# Patient Record
Sex: Female | Born: 1970 | Race: Black or African American | Hispanic: No | Smoking: Never smoker
Health system: Southern US, Community
[De-identification: ages and names within clinical notes are randomized; demographics above are authoritative.]

## PROBLEM LIST (undated history)

## (undated) ENCOUNTER — Inpatient Hospital Stay (HOSPITAL_COMMUNITY): Payer: Self-pay

## (undated) DIAGNOSIS — F4310 Post-traumatic stress disorder, unspecified: Secondary | ICD-10-CM

## (undated) DIAGNOSIS — F1410 Cocaine abuse, uncomplicated: Secondary | ICD-10-CM

## (undated) DIAGNOSIS — J45909 Unspecified asthma, uncomplicated: Secondary | ICD-10-CM

## (undated) DIAGNOSIS — F419 Anxiety disorder, unspecified: Secondary | ICD-10-CM

## (undated) DIAGNOSIS — F99 Mental disorder, not otherwise specified: Secondary | ICD-10-CM

## (undated) DIAGNOSIS — S86019A Strain of unspecified Achilles tendon, initial encounter: Secondary | ICD-10-CM

## (undated) DIAGNOSIS — Z889 Allergy status to unspecified drugs, medicaments and biological substances status: Secondary | ICD-10-CM

## (undated) HISTORY — PX: DILATION AND EVACUATION: SHX1459

## (undated) HISTORY — DX: Mental disorder, not otherwise specified: F99

## (undated) HISTORY — DX: Anxiety disorder, unspecified: F41.9

## (undated) HISTORY — PX: WISDOM TOOTH EXTRACTION: SHX21

## (undated) HISTORY — PX: NO PAST SURGERIES: SHX2092

## (undated) HISTORY — DX: Post-traumatic stress disorder, unspecified: F43.10

---

## 1998-05-20 ENCOUNTER — Other Ambulatory Visit: Admission: RE | Admit: 1998-05-20 | Discharge: 1998-05-20 | Payer: Self-pay | Admitting: Obstetrics & Gynecology

## 1998-05-20 ENCOUNTER — Other Ambulatory Visit: Admission: RE | Admit: 1998-05-20 | Discharge: 1998-05-20 | Payer: Self-pay | Admitting: Obstetrics and Gynecology

## 1998-06-09 ENCOUNTER — Inpatient Hospital Stay (HOSPITAL_COMMUNITY): Admission: AD | Admit: 1998-06-09 | Discharge: 1998-06-09 | Payer: Self-pay | Admitting: Obstetrics

## 1998-06-10 ENCOUNTER — Emergency Department (HOSPITAL_COMMUNITY): Admission: EM | Admit: 1998-06-10 | Discharge: 1998-06-10 | Payer: Self-pay | Admitting: Emergency Medicine

## 1998-08-09 ENCOUNTER — Inpatient Hospital Stay (HOSPITAL_COMMUNITY): Admission: AD | Admit: 1998-08-09 | Discharge: 1998-08-09 | Payer: Self-pay | Admitting: Obstetrics and Gynecology

## 1998-08-09 ENCOUNTER — Encounter: Payer: Self-pay | Admitting: Obstetrics and Gynecology

## 1998-11-12 ENCOUNTER — Inpatient Hospital Stay (HOSPITAL_COMMUNITY): Admission: AD | Admit: 1998-11-12 | Discharge: 1998-11-12 | Payer: Self-pay | Admitting: Obstetrics and Gynecology

## 1998-12-12 ENCOUNTER — Inpatient Hospital Stay (HOSPITAL_COMMUNITY): Admission: AD | Admit: 1998-12-12 | Discharge: 1998-12-15 | Payer: Self-pay | Admitting: Obstetrics & Gynecology

## 1998-12-15 ENCOUNTER — Encounter (HOSPITAL_COMMUNITY): Admission: RE | Admit: 1998-12-15 | Discharge: 1999-03-15 | Payer: Self-pay | Admitting: Obstetrics & Gynecology

## 2000-08-13 ENCOUNTER — Inpatient Hospital Stay (HOSPITAL_COMMUNITY): Admission: AD | Admit: 2000-08-13 | Discharge: 2000-08-13 | Payer: Self-pay | Admitting: *Deleted

## 2000-08-13 ENCOUNTER — Encounter: Payer: Self-pay | Admitting: *Deleted

## 2000-09-12 ENCOUNTER — Encounter: Payer: Self-pay | Admitting: Obstetrics

## 2000-09-12 ENCOUNTER — Inpatient Hospital Stay (HOSPITAL_COMMUNITY): Admission: AD | Admit: 2000-09-12 | Discharge: 2000-09-12 | Payer: Self-pay | Admitting: Obstetrics

## 2000-10-10 ENCOUNTER — Other Ambulatory Visit: Admission: RE | Admit: 2000-10-10 | Discharge: 2000-10-10 | Payer: Self-pay | Admitting: Obstetrics and Gynecology

## 2001-01-22 ENCOUNTER — Observation Stay (HOSPITAL_COMMUNITY): Admission: AD | Admit: 2001-01-22 | Discharge: 2001-01-23 | Payer: Self-pay | Admitting: Obstetrics & Gynecology

## 2001-01-23 ENCOUNTER — Encounter: Payer: Self-pay | Admitting: Obstetrics & Gynecology

## 2001-03-09 ENCOUNTER — Inpatient Hospital Stay (HOSPITAL_COMMUNITY): Admission: AD | Admit: 2001-03-09 | Discharge: 2001-03-11 | Payer: Self-pay | Admitting: Obstetrics and Gynecology

## 2001-11-23 ENCOUNTER — Inpatient Hospital Stay (HOSPITAL_COMMUNITY): Admission: AD | Admit: 2001-11-23 | Discharge: 2001-11-23 | Payer: Self-pay | Admitting: Obstetrics

## 2002-04-20 ENCOUNTER — Other Ambulatory Visit: Admission: RE | Admit: 2002-04-20 | Discharge: 2002-04-20 | Payer: Self-pay | Admitting: Obstetrics and Gynecology

## 2002-07-12 ENCOUNTER — Encounter (INDEPENDENT_AMBULATORY_CARE_PROVIDER_SITE_OTHER): Payer: Self-pay

## 2002-07-12 ENCOUNTER — Inpatient Hospital Stay (HOSPITAL_COMMUNITY): Admission: AD | Admit: 2002-07-12 | Discharge: 2002-07-13 | Payer: Self-pay | Admitting: Obstetrics and Gynecology

## 2003-03-05 ENCOUNTER — Inpatient Hospital Stay (HOSPITAL_COMMUNITY): Admission: AD | Admit: 2003-03-05 | Discharge: 2003-03-05 | Payer: Self-pay | Admitting: Obstetrics and Gynecology

## 2003-12-16 ENCOUNTER — Emergency Department (HOSPITAL_COMMUNITY): Admission: EM | Admit: 2003-12-16 | Discharge: 2003-12-16 | Payer: Self-pay | Admitting: Emergency Medicine

## 2005-04-11 ENCOUNTER — Emergency Department (HOSPITAL_COMMUNITY): Admission: EM | Admit: 2005-04-11 | Discharge: 2005-04-11 | Payer: Self-pay | Admitting: Emergency Medicine

## 2005-07-23 ENCOUNTER — Emergency Department (HOSPITAL_COMMUNITY): Admission: EM | Admit: 2005-07-23 | Discharge: 2005-07-23 | Payer: Self-pay | Admitting: Emergency Medicine

## 2005-09-22 ENCOUNTER — Emergency Department (HOSPITAL_COMMUNITY): Admission: EM | Admit: 2005-09-22 | Discharge: 2005-09-22 | Payer: Self-pay | Admitting: Emergency Medicine

## 2005-11-14 ENCOUNTER — Inpatient Hospital Stay (HOSPITAL_COMMUNITY): Admission: AD | Admit: 2005-11-14 | Discharge: 2005-11-14 | Payer: Self-pay | Admitting: Family Medicine

## 2006-05-22 ENCOUNTER — Emergency Department (HOSPITAL_COMMUNITY): Admission: EM | Admit: 2006-05-22 | Discharge: 2006-05-22 | Payer: Self-pay

## 2006-05-24 ENCOUNTER — Emergency Department (HOSPITAL_COMMUNITY): Admission: EM | Admit: 2006-05-24 | Discharge: 2006-05-24 | Payer: Self-pay | Admitting: Family Medicine

## 2006-07-05 ENCOUNTER — Emergency Department (HOSPITAL_COMMUNITY): Admission: EM | Admit: 2006-07-05 | Discharge: 2006-07-05 | Payer: Self-pay | Admitting: Family Medicine

## 2006-09-16 ENCOUNTER — Emergency Department (HOSPITAL_COMMUNITY): Admission: EM | Admit: 2006-09-16 | Discharge: 2006-09-16 | Payer: Self-pay | Admitting: Emergency Medicine

## 2006-09-19 ENCOUNTER — Ambulatory Visit (HOSPITAL_COMMUNITY): Admission: RE | Admit: 2006-09-19 | Discharge: 2006-09-19 | Payer: Self-pay | Admitting: Oral and Maxillofacial Surgery

## 2006-12-04 ENCOUNTER — Emergency Department (HOSPITAL_COMMUNITY): Admission: EM | Admit: 2006-12-04 | Discharge: 2006-12-04 | Payer: Self-pay | Admitting: Emergency Medicine

## 2006-12-06 ENCOUNTER — Inpatient Hospital Stay (HOSPITAL_COMMUNITY): Admission: AD | Admit: 2006-12-06 | Discharge: 2006-12-06 | Payer: Self-pay | Admitting: Obstetrics & Gynecology

## 2006-12-07 ENCOUNTER — Emergency Department (HOSPITAL_COMMUNITY): Admission: EM | Admit: 2006-12-07 | Discharge: 2006-12-08 | Payer: Self-pay | Admitting: Emergency Medicine

## 2007-01-18 ENCOUNTER — Emergency Department (HOSPITAL_COMMUNITY): Admission: EM | Admit: 2007-01-18 | Discharge: 2007-01-18 | Payer: Self-pay | Admitting: Emergency Medicine

## 2007-09-08 ENCOUNTER — Emergency Department (HOSPITAL_COMMUNITY): Admission: EM | Admit: 2007-09-08 | Discharge: 2007-09-08 | Payer: Self-pay | Admitting: Emergency Medicine

## 2008-01-30 ENCOUNTER — Emergency Department (HOSPITAL_COMMUNITY): Admission: EM | Admit: 2008-01-30 | Discharge: 2008-01-30 | Payer: Self-pay | Admitting: Emergency Medicine

## 2008-02-01 ENCOUNTER — Emergency Department (HOSPITAL_COMMUNITY): Admission: EM | Admit: 2008-02-01 | Discharge: 2008-02-01 | Payer: Self-pay | Admitting: Family Medicine

## 2008-02-25 ENCOUNTER — Inpatient Hospital Stay (HOSPITAL_COMMUNITY): Admission: AD | Admit: 2008-02-25 | Discharge: 2008-02-25 | Payer: Self-pay | Admitting: Obstetrics and Gynecology

## 2008-11-30 ENCOUNTER — Emergency Department (HOSPITAL_COMMUNITY): Admission: EM | Admit: 2008-11-30 | Discharge: 2008-11-30 | Payer: Self-pay | Admitting: Emergency Medicine

## 2008-12-01 ENCOUNTER — Emergency Department (HOSPITAL_COMMUNITY): Admission: EM | Admit: 2008-12-01 | Discharge: 2008-12-01 | Payer: Self-pay | Admitting: Emergency Medicine

## 2008-12-02 ENCOUNTER — Emergency Department (HOSPITAL_COMMUNITY): Admission: EM | Admit: 2008-12-02 | Discharge: 2008-12-02 | Payer: Self-pay | Admitting: Emergency Medicine

## 2008-12-04 ENCOUNTER — Emergency Department (HOSPITAL_COMMUNITY): Admission: EM | Admit: 2008-12-04 | Discharge: 2008-12-04 | Payer: Self-pay | Admitting: Emergency Medicine

## 2008-12-09 ENCOUNTER — Emergency Department (HOSPITAL_COMMUNITY): Admission: EM | Admit: 2008-12-09 | Discharge: 2008-12-09 | Payer: Self-pay | Admitting: Emergency Medicine

## 2008-12-12 ENCOUNTER — Emergency Department (HOSPITAL_COMMUNITY): Admission: EM | Admit: 2008-12-12 | Discharge: 2008-12-12 | Payer: Self-pay | Admitting: Emergency Medicine

## 2008-12-12 ENCOUNTER — Inpatient Hospital Stay (HOSPITAL_COMMUNITY): Admission: EM | Admit: 2008-12-12 | Discharge: 2008-12-13 | Payer: Self-pay | Admitting: Emergency Medicine

## 2008-12-12 ENCOUNTER — Ambulatory Visit: Payer: Self-pay | Admitting: Internal Medicine

## 2008-12-17 ENCOUNTER — Encounter: Payer: Self-pay | Admitting: Internal Medicine

## 2008-12-17 ENCOUNTER — Emergency Department (HOSPITAL_COMMUNITY): Admission: EM | Admit: 2008-12-17 | Discharge: 2008-12-17 | Payer: Self-pay | Admitting: Emergency Medicine

## 2008-12-25 ENCOUNTER — Emergency Department (HOSPITAL_COMMUNITY): Admission: EM | Admit: 2008-12-25 | Discharge: 2008-12-25 | Payer: Self-pay | Admitting: Emergency Medicine

## 2009-01-09 ENCOUNTER — Emergency Department (HOSPITAL_COMMUNITY): Admission: EM | Admit: 2009-01-09 | Discharge: 2009-01-09 | Payer: Self-pay | Admitting: Emergency Medicine

## 2009-01-10 ENCOUNTER — Emergency Department (HOSPITAL_COMMUNITY): Admission: EM | Admit: 2009-01-10 | Discharge: 2009-01-10 | Payer: Self-pay | Admitting: Emergency Medicine

## 2009-01-18 ENCOUNTER — Emergency Department (HOSPITAL_COMMUNITY): Admission: EM | Admit: 2009-01-18 | Discharge: 2009-01-18 | Payer: Self-pay | Admitting: *Deleted

## 2009-01-29 ENCOUNTER — Emergency Department (HOSPITAL_COMMUNITY): Admission: EM | Admit: 2009-01-29 | Discharge: 2009-01-29 | Payer: Self-pay | Admitting: Emergency Medicine

## 2010-02-26 IMAGING — CR DG ABDOMEN ACUTE W/ 1V CHEST
3 series · 3 of 3 positions shown · non-contrast
Comparison: 01/30/2008

CLINICAL DATA: Nausea, vomiting.  Abdominal pain.

ACUTE ABDOMEN SERIES (ABDOMEN 2 VIEW & CHEST 1 VIEW)

[w chest pa]
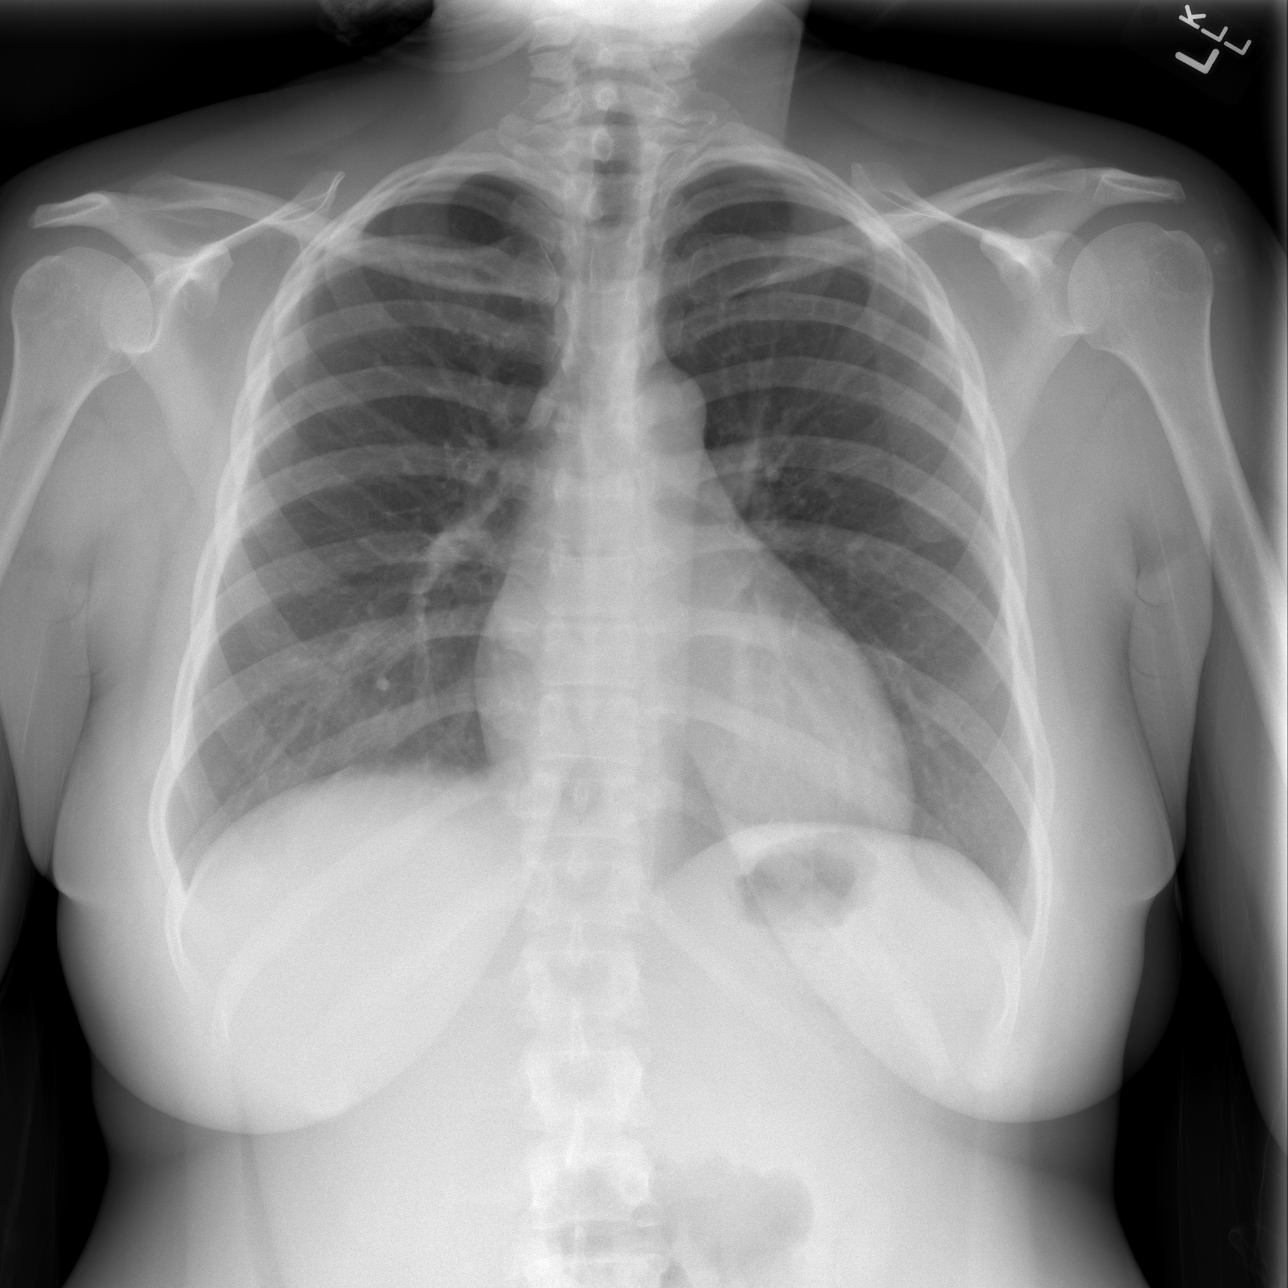

[w abdomen upright *]
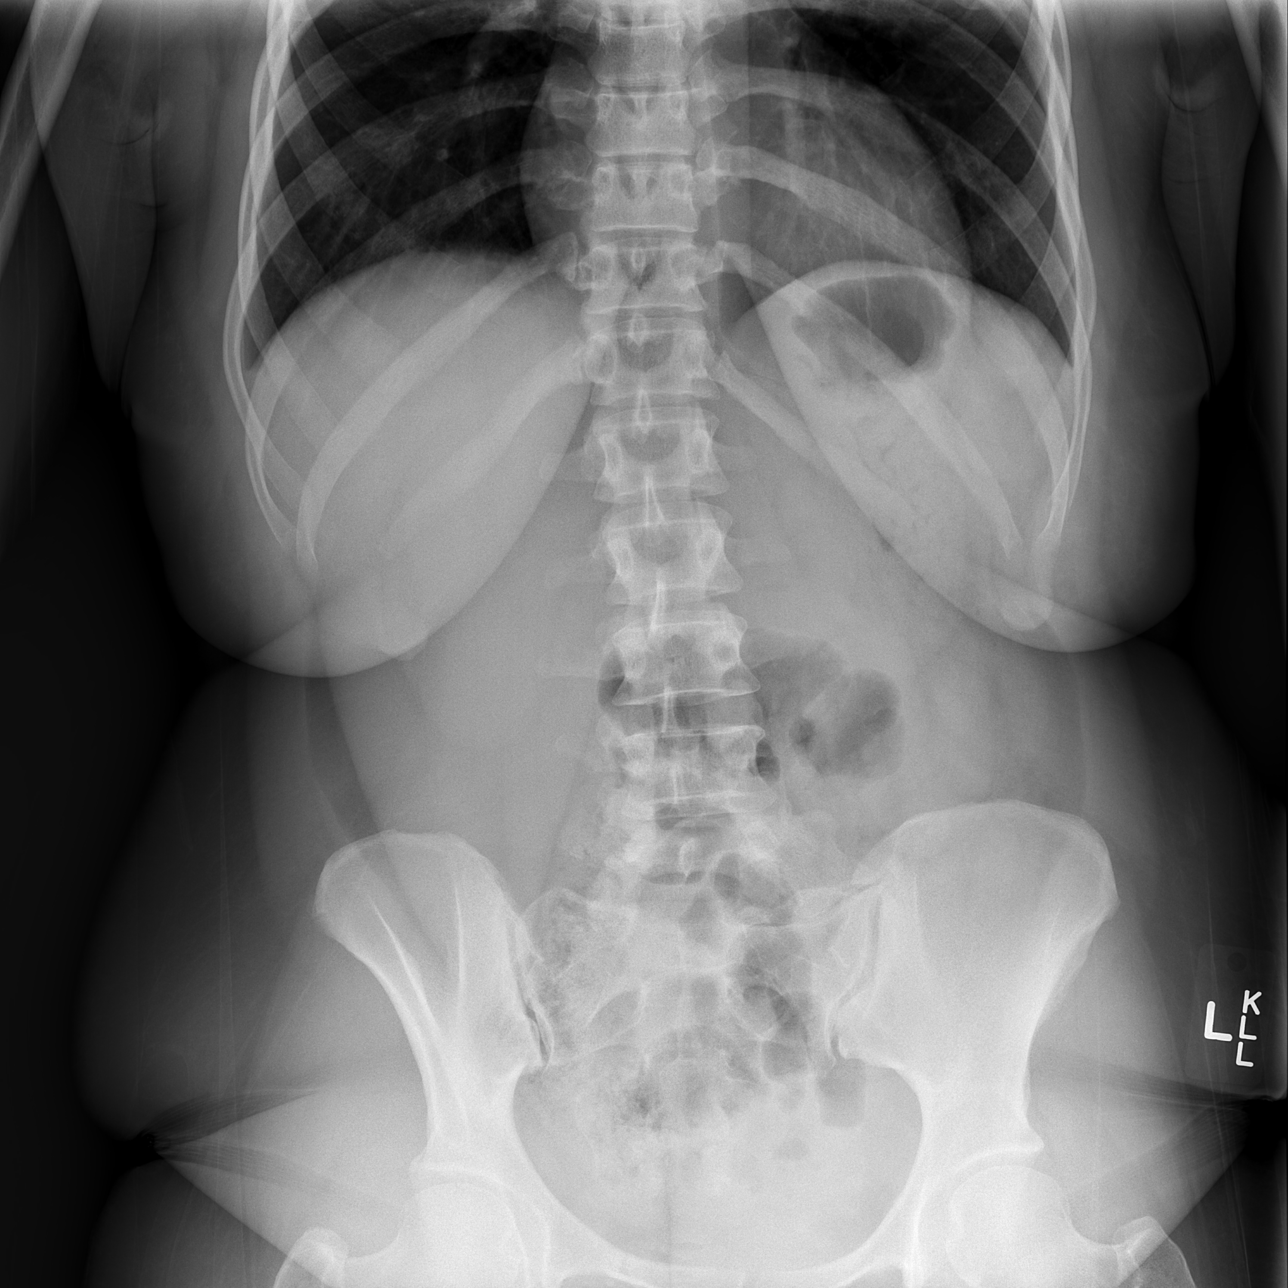

[t abdomen supine]
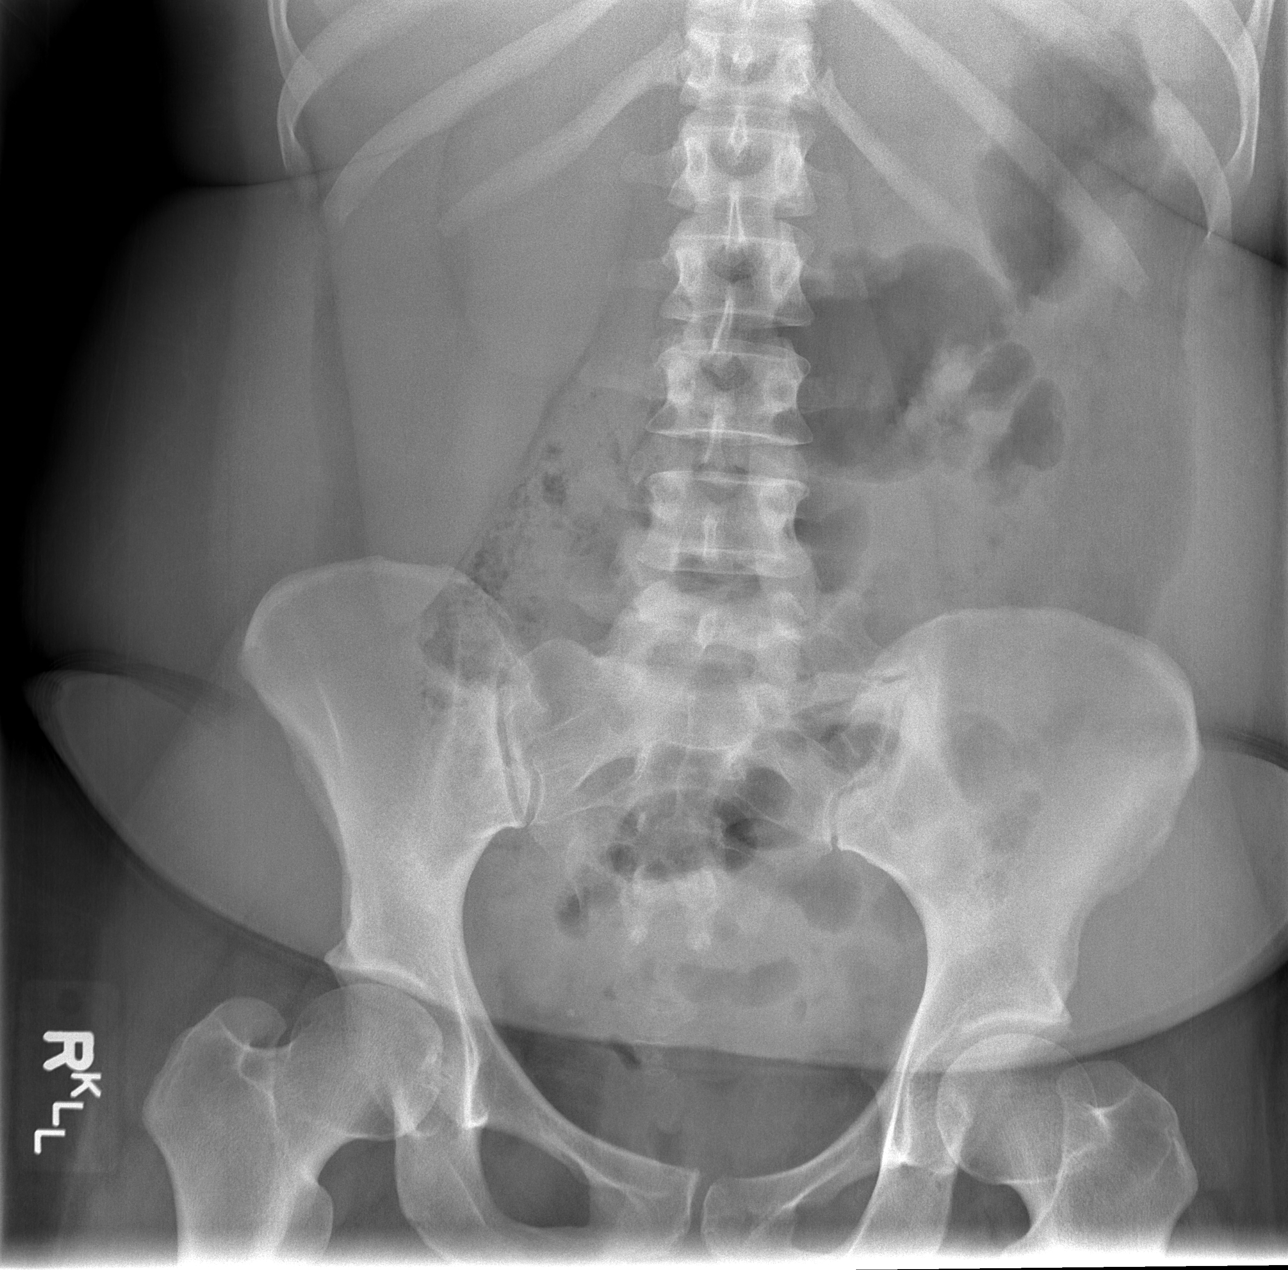

[3 of 3 positions shown; findings below may reference images not displayed]

FINDINGS: Cardiomediastinal silhouette is within normal limits.
Lungs are free of focal consolidations and pleural effusions.
There is no evidence for free intraperitoneal air.

Supine and erect views of the abdomen show a nonobstructive bowel
gas pattern.  The patient has a transitional type vertebral body at
the lumbosacral junction.  The liver shadow is prominent but this
probably represents Wuilmer Philogene lobe.
IMPRESSION: No evidence for acute cardiopulmonary disease or bowel obstruction.

## 2010-03-09 IMAGING — CT CT ABDOMEN W/ CM
2 of 5 series · 17 of 46 positions shown, 19 images · IV contrast (agent unspecified)
Comparison: 12/01/2008

CT ABDOMEN

CLINICAL DATA: Lower abdominal pain, nausea, vomiting

CT ABDOMEN AND PELVIS WITH CONTRAST
TECHNIQUE: Multidetector CT imaging of the abdomen and pelvis was
performed using the standard protocol following bolus
administration of intravenous contrast. Sagittal and coronal MPR
images reconstructed from axial data set.
Contrast: Dilute oral contrast and 100 ml Lmnipaque-NAA

[Series 2: abd_pel 5.0 b40f st · axial · 0.59mm/px · z∈[+763,+1133]mm · 14 of 84 slices shown, 16 images]
[im 5/84  soft-tissue]
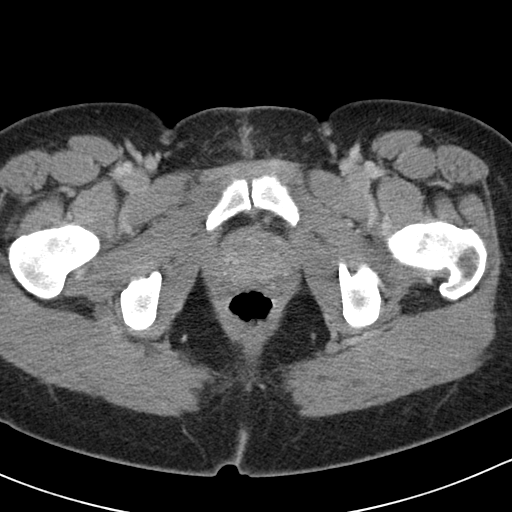
[im 5/84  bone]
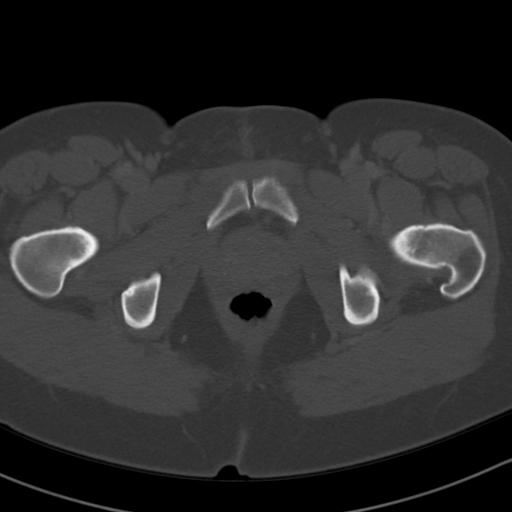
[im 9/84  soft-tissue]
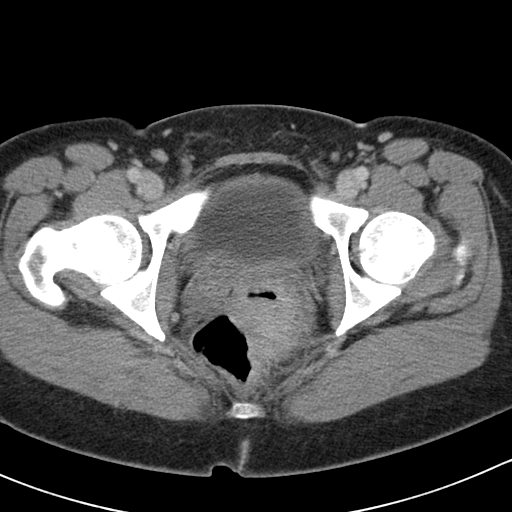
[im 18/84  soft-tissue]
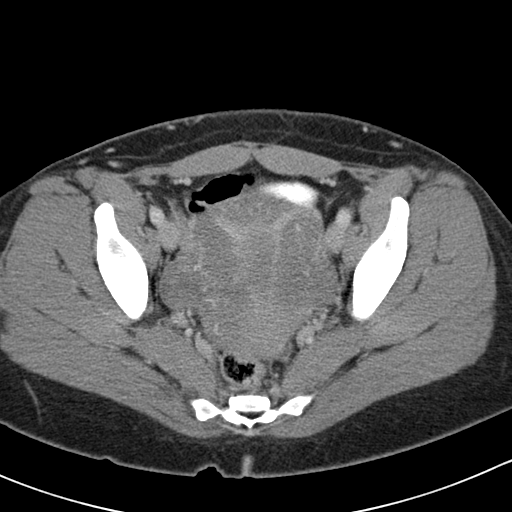
[im 22/84  soft-tissue]
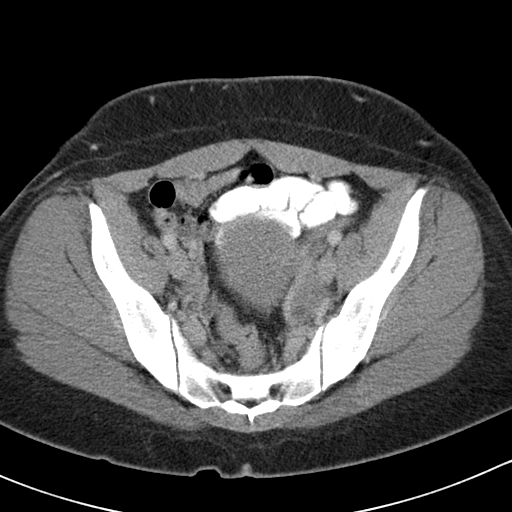
[im 27/84  soft-tissue]
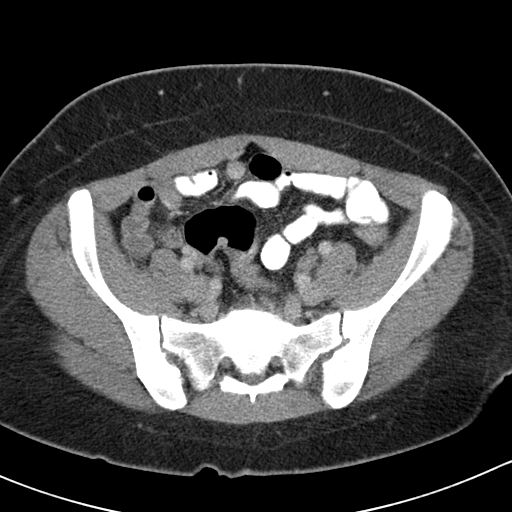
[im 35/84  soft-tissue]
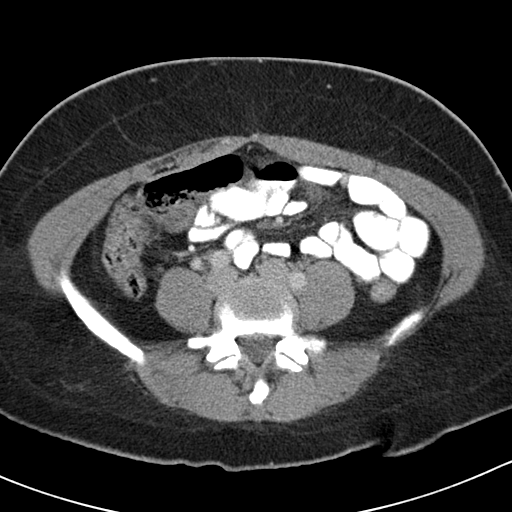
[im 40/84  soft-tissue]
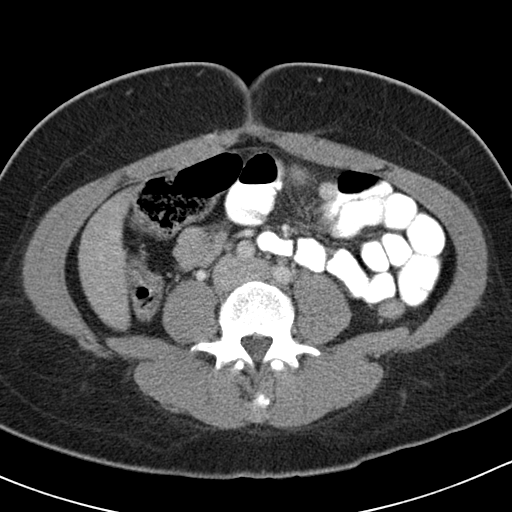
[im 44/84  soft-tissue]
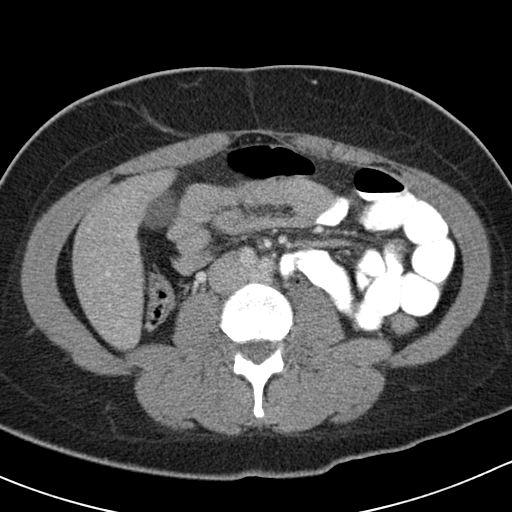
[im 49/84  soft-tissue]
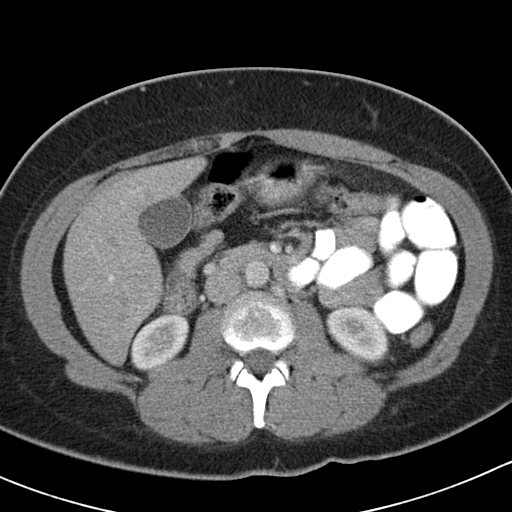
[im 49/84  bone]
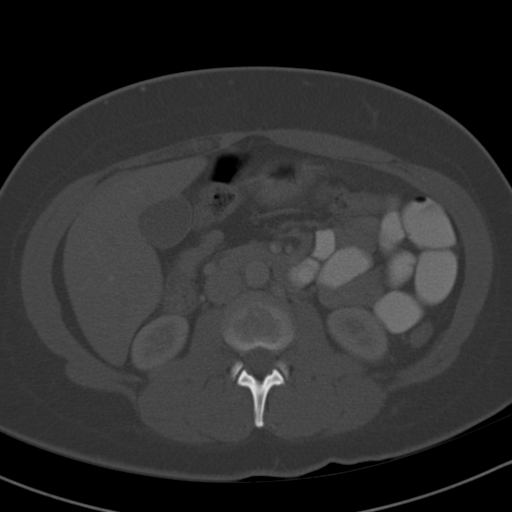
[im 57/84  soft-tissue]
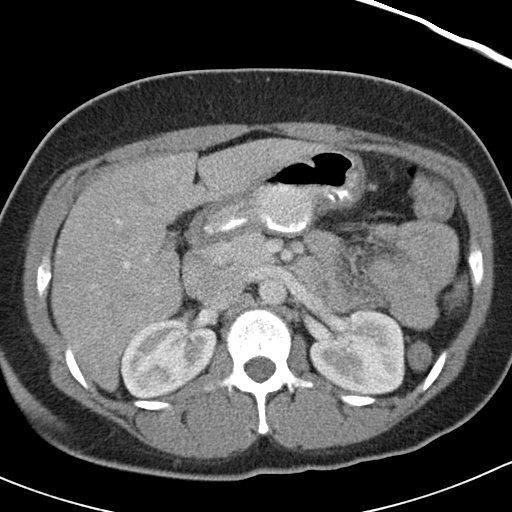
[im 62/84  soft-tissue]
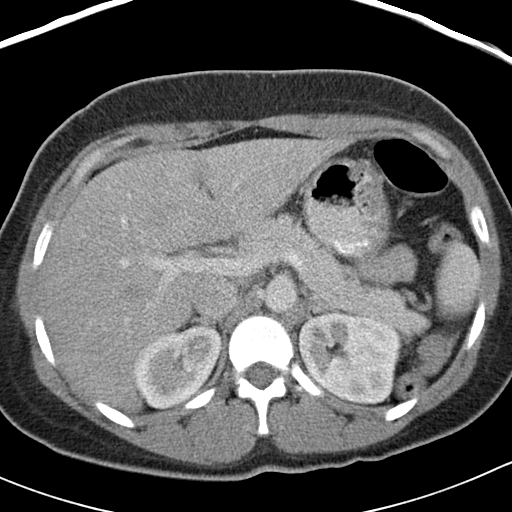
[im 66/84  soft-tissue]
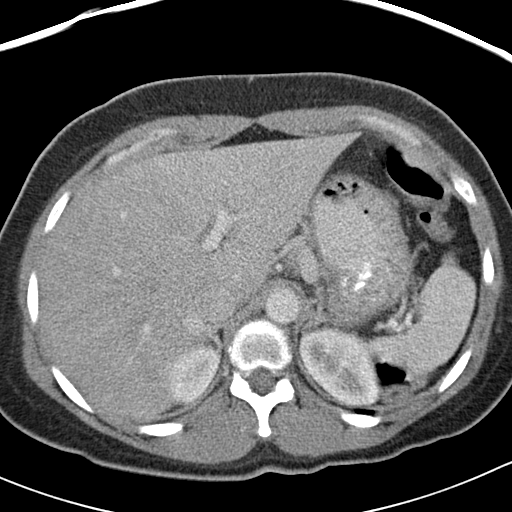
[im 75/84  soft-tissue]
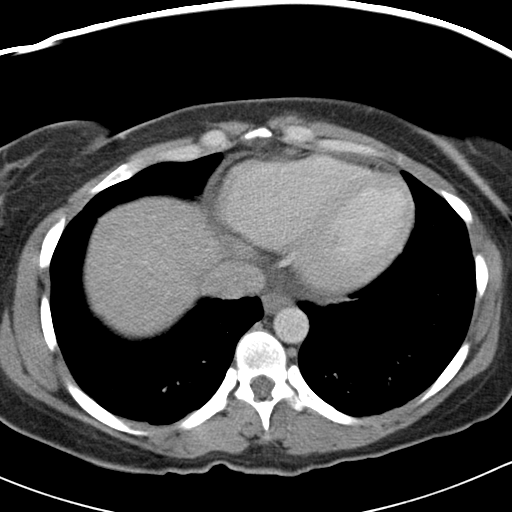
[im 79/84  soft-tissue]
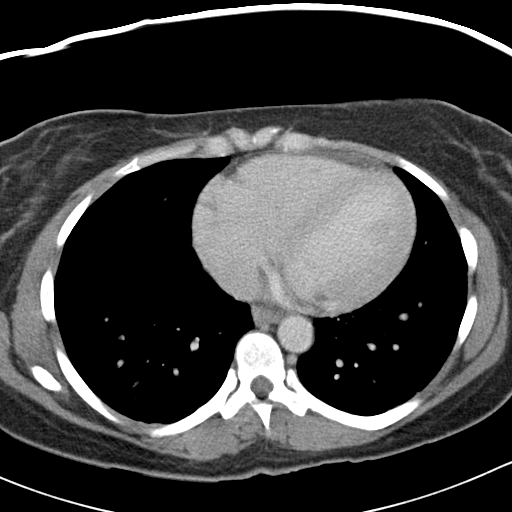

[Series 602: <mpr thick range> · coronal · 0.85mm/px · 3 of 65 slices shown]
[im 22/65  soft-tissue]
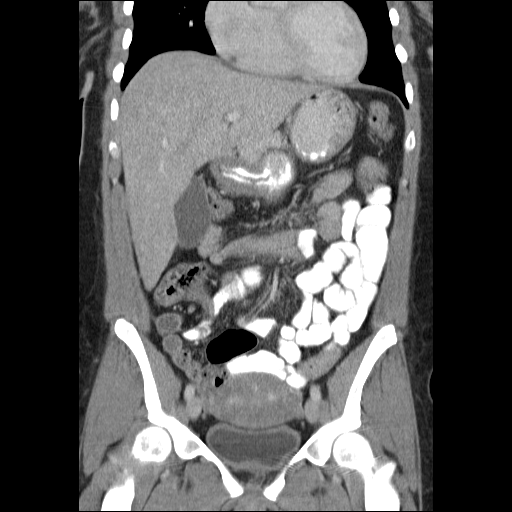
[im 29/65  soft-tissue]
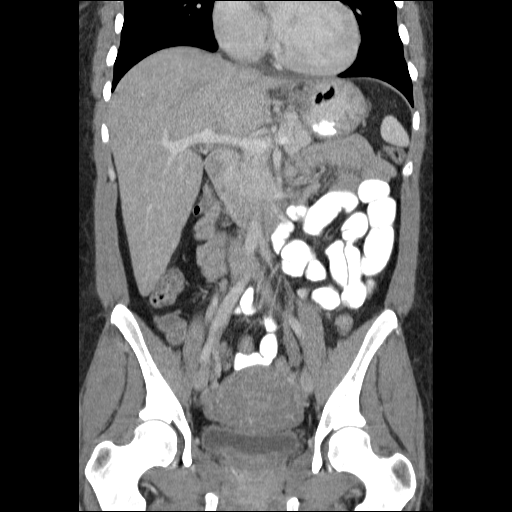
[im 36/65  soft-tissue]
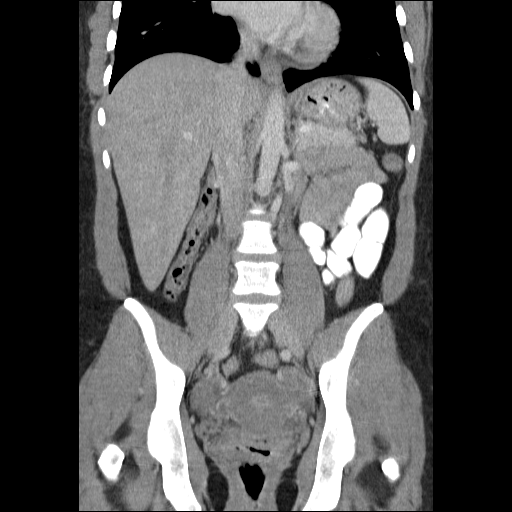

[17 of 46 positions shown; findings below may reference images not displayed]

FINDINGS: Minimal dependent atelectasis at lung bases.
Liver, spleen, pancreas, kidneys, and adrenal glands normal
appearance.
Symmetric nephrograms without renal mass, stone, or hydronephrosis.
Stomach and upper abdominal bowel loops unremarkable.
No mass, adenopathy, free fluid, or inflammatory process.
IMPRESSION: No acute upper abdominal abnormalities.

CT PELVIS
FINDINGS: Unremarkable bladder and distal ureters.
Slight prominent uterine size, uterus low attenuation, without
focal uterine mass.
Normal appendix, best appreciated on coronal images.
No pelvic mass, adenopathy, free fluid, or inflammatory process.
Pelvic free fluid seen on previous exam resolved.
Pelvic bowel loops normal appearance.
Bones unremarkable.
IMPRESSION: Uterus minimally more prominent than on previous study but
otherwise normal appearance.
Otherwise normal CT pelvis.

## 2010-03-23 ENCOUNTER — Inpatient Hospital Stay (HOSPITAL_COMMUNITY): Admission: AD | Admit: 2010-03-23 | Discharge: 2010-03-23 | Payer: Self-pay | Admitting: Obstetrics & Gynecology

## 2010-11-21 ENCOUNTER — Inpatient Hospital Stay (HOSPITAL_COMMUNITY)
Admission: AD | Admit: 2010-11-21 | Discharge: 2010-11-21 | Payer: Self-pay | Source: Home / Self Care | Attending: Obstetrics & Gynecology | Admitting: Obstetrics & Gynecology

## 2011-02-09 LAB — COMPREHENSIVE METABOLIC PANEL
ALT: 19 U/L (ref 0–35)
AST: 21 U/L (ref 0–37)
Alkaline Phosphatase: 60 U/L (ref 39–117)
CO2: 27 mEq/L (ref 19–32)
Calcium: 9.4 mg/dL (ref 8.4–10.5)
GFR calc Af Amer: >60 mL/min (ref >60)
GFR calc non Af Amer: >60 mL/min (ref >60)
Potassium: 4.1 mEq/L (ref 3.5–5.1)
Sodium: 137 mEq/L (ref 135–145)
Total Protein: 7.1 g/dL (ref 6.0–8.3)

## 2011-02-09 LAB — URINALYSIS, ROUTINE W REFLEX MICROSCOPIC
Nitrite: NEGATIVE
Protein, ur: NEGATIVE mg/dL
Specific Gravity, Urine: 1.025 (ref 1.005–1.030)
Urobilinogen, UA: 1 mg/dL (ref 0.0–1.0)

## 2011-02-09 LAB — WET PREP, GENITAL: Trich, Wet Prep: NONE SEEN

## 2011-02-09 LAB — CBC
Hemoglobin: 12.3 g/dL (ref 12.0–15.0)
MCHC: 34 g/dL (ref 30.0–36.0)
RBC: 3.94 MIL/uL (ref 3.87–5.11)

## 2011-02-09 LAB — DIFFERENTIAL
Basophils Relative: 0 % (ref 0–1)
Eosinophils Absolute: 0 10*3/uL (ref 0.0–0.7)
Eosinophils Relative: 0 % (ref 0–5)
Lymphs Abs: 1.3 10*3/uL (ref 0.7–4.0)
Monocytes Relative: 4 % (ref 3–12)

## 2011-02-09 LAB — GC/CHLAMYDIA PROBE AMP, GENITAL: GC Probe Amp, Genital: POSITIVE — AB

## 2011-02-09 LAB — URINE MICROSCOPIC-ADD ON

## 2011-03-08 LAB — COMPREHENSIVE METABOLIC PANEL
ALT: 18 U/L (ref 0–35)
ALT: 16 U/L (ref 0–35)
ALT: 22 U/L (ref 0–35)
ALT: 24 U/L (ref 0–35)
AST: 20 U/L (ref 0–37)
AST: 26 U/L (ref 0–37)
AST: 16 U/L (ref 0–37)
AST: 23 U/L (ref 0–37)
Albumin: 4 g/dL (ref 3.5–5.2)
Albumin: 4.1 g/dL (ref 3.5–5.2)
Albumin: 4.1 g/dL (ref 3.5–5.2)
Albumin: 3.8 g/dL (ref 3.5–5.2)
Albumin: 4.1 g/dL (ref 3.5–5.2)
Alkaline Phosphatase: 44 U/L (ref 39–117)
Alkaline Phosphatase: 45 U/L (ref 39–117)
Alkaline Phosphatase: 44 U/L (ref 39–117)
Alkaline Phosphatase: 49 U/L (ref 39–117)
Alkaline Phosphatase: 57 U/L (ref 39–117)
BUN: 3 mg/dL — ABNORMAL LOW (ref 6–23)
CO2: 23 mEq/L (ref 19–32)
CO2: 27 mEq/L (ref 19–32)
Calcium: 8.2 mg/dL — ABNORMAL LOW (ref 8.4–10.5)
Calcium: 9.7 mg/dL (ref 8.4–10.5)
Calcium: 9.2 mg/dL (ref 8.4–10.5)
Chloride: 109 mEq/L (ref 96–112)
Chloride: 106 mEq/L (ref 96–112)
Chloride: 106 mEq/L (ref 96–112)
Creatinine, Ser: 0.94 mg/dL (ref 0.4–1.2)
Creatinine, Ser: 1 mg/dL (ref 0.4–1.2)
Creatinine, Ser: 1.03 mg/dL (ref 0.4–1.2)
GFR calc Af Amer: >60 mL/min (ref >60)
GFR calc Af Amer: >60 mL/min (ref >60)
GFR calc non Af Amer: >60 mL/min (ref >60)
GFR calc non Af Amer: >60 mL/min (ref >60)
Glucose, Bld: 99 mg/dL (ref 70–99)
Glucose, Bld: 85 mg/dL (ref 70–99)
Glucose, Bld: 90 mg/dL (ref 70–99)
Potassium: 2.8 mEq/L — ABNORMAL LOW (ref 3.5–5.1)
Potassium: 2.9 mEq/L — ABNORMAL LOW (ref 3.5–5.1)
Potassium: 3.5 mEq/L (ref 3.5–5.1)
Potassium: 3.2 mEq/L — ABNORMAL LOW (ref 3.5–5.1)
Potassium: 3.3 mEq/L — ABNORMAL LOW (ref 3.5–5.1)
Potassium: 3.7 mEq/L (ref 3.5–5.1)
Sodium: 139 mEq/L (ref 135–145)
Sodium: 138 mEq/L (ref 135–145)
Sodium: 140 mEq/L (ref 135–145)
Sodium: 140 mEq/L (ref 135–145)
Total Bilirubin: 0.9 mg/dL (ref 0.3–1.2)
Total Bilirubin: 1 mg/dL (ref 0.3–1.2)
Total Bilirubin: 1.1 mg/dL (ref 0.3–1.2)
Total Protein: 6.9 g/dL (ref 6.0–8.3)
Total Protein: 7 g/dL (ref 6.0–8.3)
Total Protein: 7.4 g/dL (ref 6.0–8.3)
Total Protein: 6.8 g/dL (ref 6.0–8.3)
Total Protein: 7.1 g/dL (ref 6.0–8.3)

## 2011-03-08 LAB — CBC
HCT: 35.7 % — ABNORMAL LOW (ref 36.0–46.0)
HCT: 37.4 % (ref 36.0–46.0)
HCT: 31.3 % — ABNORMAL LOW (ref 36.0–46.0)
HCT: 35.6 % — ABNORMAL LOW (ref 36.0–46.0)
HCT: 36.6 % (ref 36.0–46.0)
Hemoglobin: 10.2 g/dL — ABNORMAL LOW (ref 12.0–15.0)
Hemoglobin: 12.1 g/dL (ref 12.0–15.0)
MCHC: 32.6 g/dL (ref 30.0–36.0)
MCHC: 33.1 g/dL (ref 30.0–36.0)
MCV: 92 fL (ref 78.0–100.0)
MCV: 92.4 fL (ref 78.0–100.0)
MCV: 92.6 fL (ref 78.0–100.0)
MCV: 92.1 fL (ref 78.0–100.0)
MCV: 92.7 fL (ref 78.0–100.0)
Platelets: 351 10*3/uL (ref 150–400)
Platelets: 357 10*3/uL (ref 150–400)
Platelets: 363 10*3/uL (ref 150–400)
Platelets: 267 10*3/uL (ref 150–400)
Platelets: 327 10*3/uL (ref 150–400)
Platelets: 354 10*3/uL (ref 150–400)
RBC: 3.85 MIL/uL — ABNORMAL LOW (ref 3.87–5.11)
RBC: 3.98 MIL/uL (ref 3.87–5.11)
RDW: 14.6 % (ref 11.5–15.5)
RDW: 14.2 % (ref 11.5–15.5)
RDW: 14.3 % (ref 11.5–15.5)
RDW: 14.8 % (ref 11.5–15.5)
WBC: 10 10*3/uL (ref 4.0–10.5)
WBC: 7.9 10*3/uL (ref 4.0–10.5)
WBC: 7.6 10*3/uL (ref 4.0–10.5)
WBC: 9 K/uL (ref 4.0–10.5)

## 2011-03-08 LAB — URINE MICROSCOPIC-ADD ON

## 2011-03-08 LAB — URINALYSIS, ROUTINE W REFLEX MICROSCOPIC
Glucose, UA: NEGATIVE mg/dL
Glucose, UA: NEGATIVE mg/dL
Glucose, UA: NEGATIVE mg/dL
Hgb urine dipstick: NEGATIVE
Hgb urine dipstick: NEGATIVE
Ketones, ur: >80 mg/dL — AB
Nitrite: NEGATIVE
Nitrite: NEGATIVE
Protein, ur: NEGATIVE mg/dL
Specific Gravity, Urine: 1.007 (ref 1.005–1.030)
Specific Gravity, Urine: 1.01 (ref 1.005–1.030)
Specific Gravity, Urine: 1.024 (ref 1.005–1.030)
Specific Gravity, Urine: 1.009 (ref 1.005–1.030)
Specific Gravity, Urine: 1.022 (ref 1.005–1.030)
Urobilinogen, UA: 1 mg/dL (ref 0.0–1.0)
Urobilinogen, UA: 0.2 mg/dL (ref 0.0–1.0)
pH: 6.5 (ref 5.0–8.0)
pH: 6 (ref 5.0–8.0)
pH: 6.5 (ref 5.0–8.0)

## 2011-03-08 LAB — DIFFERENTIAL
Basophils Absolute: 0 10*3/uL (ref 0.0–0.1)
Basophils Absolute: 0 10*3/uL (ref 0.0–0.1)
Basophils Absolute: 0.1 10*3/uL (ref 0.0–0.1)
Basophils Relative: 0 % (ref 0–1)
Basophils Relative: 0 % (ref 0–1)
Basophils Relative: 1 % (ref 0–1)
Basophils Relative: 1 % (ref 0–1)
Eosinophils Absolute: 0 10*3/uL (ref 0.0–0.7)
Eosinophils Absolute: 0 10*3/uL (ref 0.0–0.7)
Eosinophils Absolute: 0.1 10*3/uL (ref 0.0–0.7)
Eosinophils Absolute: 0.1 K/uL (ref 0.0–0.7)
Eosinophils Relative: 0 % (ref 0–5)
Eosinophils Relative: 1 % (ref 0–5)
Eosinophils Relative: 0 % (ref 0–5)
Eosinophils Relative: 1 % (ref 0–5)
Eosinophils Relative: 1 % (ref 0–5)
Lymphocytes Relative: 13 % (ref 12–46)
Lymphocytes Relative: 19 % (ref 12–46)
Lymphocytes Relative: 33 % (ref 12–46)
Lymphocytes Relative: 29 % (ref 12–46)
Lymphs Abs: 1 10*3/uL (ref 0.7–4.0)
Lymphs Abs: 1.5 10*3/uL (ref 0.7–4.0)
Lymphs Abs: 2.6 K/uL (ref 0.7–4.0)
Monocytes Absolute: 0.3 10*3/uL (ref 0.1–1.0)
Monocytes Absolute: 0.4 10*3/uL (ref 0.1–1.0)
Monocytes Absolute: 0.6 10*3/uL (ref 0.1–1.0)
Monocytes Absolute: 0.5 10*3/uL (ref 0.1–1.0)
Monocytes Absolute: 0.7 K/uL (ref 0.1–1.0)
Monocytes Relative: 4 % (ref 3–12)
Monocytes Relative: 5 % (ref 3–12)
Monocytes Relative: 8 % (ref 3–12)
Monocytes Relative: 7 % (ref 3–12)
Monocytes Relative: 8 % (ref 3–12)
Neutro Abs: 6 10*3/uL (ref 1.7–7.7)
Neutro Abs: 5.2 10*3/uL (ref 1.7–7.7)
Neutro Abs: 5.5 10*3/uL (ref 1.7–7.7)
Neutrophils Relative %: 80 % — ABNORMAL HIGH (ref 43–77)
Neutrophils Relative %: 62 % (ref 43–77)
Neutrophils Relative %: 71 % (ref 43–77)

## 2011-03-08 LAB — PREGNANCY, URINE
Preg Test, Ur: NEGATIVE
Preg Test, Ur: NEGATIVE

## 2011-03-08 LAB — T4, FREE: Free T4: 0.81 ng/dL — ABNORMAL LOW (ref 0.89–1.80)

## 2011-03-08 LAB — GC/CHLAMYDIA PROBE AMP, GENITAL: Chlamydia, DNA Probe: NEGATIVE

## 2011-03-08 LAB — COMPREHENSIVE METABOLIC PANEL WITH GFR
AST: 24 U/L (ref 0–37)
BUN: 4 mg/dL — ABNORMAL LOW (ref 6–23)
CO2: 24 meq/L (ref 19–32)
Chloride: 106 meq/L (ref 96–112)
Creatinine, Ser: 1.02 mg/dL (ref 0.4–1.2)
GFR calc Af Amer: >60 mL/min (ref >60)
GFR calc non Af Amer: >60 mL/min (ref >60)
Total Bilirubin: 1.1 mg/dL (ref 0.3–1.2)

## 2011-03-08 LAB — LIPASE, BLOOD
Lipase: 16 U/L (ref 11–59)
Lipase: 29 U/L (ref 11–59)
Lipase: 33 U/L (ref 11–59)

## 2011-03-08 LAB — IRON AND TIBC
Iron: 51 ug/dL (ref 42–135)
Saturation Ratios: 20 % (ref 20–55)
UIBC: 203 ug/dL

## 2011-03-08 LAB — VITAMIN B12: Vitamin B-12: 367 pg/mL (ref 211–911)

## 2011-03-08 LAB — HEMOCCULT GUIAC POC 1CARD (OFFICE): Fecal Occult Bld: NEGATIVE

## 2011-03-08 LAB — LACTIC ACID, PLASMA: Lactic Acid, Venous: 1.6 mmol/L (ref 0.5–2.2)

## 2011-03-08 LAB — RETICULOCYTES: Retic Count, Absolute: 37.4 10*3/uL (ref 19.0–186.0)

## 2011-03-08 LAB — FERRITIN: Ferritin: 27 ng/mL (ref 10–291)

## 2011-03-08 LAB — BASIC METABOLIC PANEL
CO2: 26 mEq/L (ref 19–32)
Chloride: 106 mEq/L (ref 96–112)
Creatinine, Ser: 0.86 mg/dL (ref 0.4–1.2)
GFR calc Af Amer: >60 mL/min (ref >60)
Glucose, Bld: 108 mg/dL — ABNORMAL HIGH (ref 70–99)

## 2011-03-08 LAB — T3, FREE: T3, Free: 3.1 pg/mL (ref 2.3–4.2)

## 2011-03-08 LAB — WET PREP, GENITAL: WBC, Wet Prep HPF POC: NONE SEEN

## 2011-03-09 LAB — COMPREHENSIVE METABOLIC PANEL
ALT: 15 U/L (ref 0–35)
ALT: 10 U/L (ref 0–35)
ALT: 8 U/L (ref 0–35)
AST: 22 U/L (ref 0–37)
AST: 17 U/L (ref 0–37)
Albumin: 4.3 g/dL (ref 3.5–5.2)
Alkaline Phosphatase: 63 U/L (ref 39–117)
Alkaline Phosphatase: 45 U/L (ref 39–117)
Alkaline Phosphatase: 57 U/L (ref 39–117)
CO2: 25 mEq/L (ref 19–32)
CO2: 28 mEq/L (ref 19–32)
CO2: 21 mEq/L (ref 19–32)
Chloride: 104 mEq/L (ref 96–112)
Chloride: 106 mEq/L (ref 96–112)
Chloride: 102 mEq/L (ref 96–112)
Creatinine, Ser: 0.99 mg/dL (ref 0.4–1.2)
GFR calc Af Amer: >60 mL/min (ref >60)
GFR calc Af Amer: >60 mL/min (ref >60)
GFR calc non Af Amer: >60 mL/min (ref >60)
GFR calc non Af Amer: >60 mL/min (ref >60)
GFR calc non Af Amer: >60 mL/min (ref >60)
Glucose, Bld: 84 mg/dL (ref 70–99)
Potassium: 3.4 mEq/L — ABNORMAL LOW (ref 3.5–5.1)
Potassium: 3.4 mEq/L — ABNORMAL LOW (ref 3.5–5.1)
Sodium: 139 mEq/L (ref 135–145)
Sodium: 139 mEq/L (ref 135–145)
Sodium: 133 mEq/L — ABNORMAL LOW (ref 135–145)
Total Bilirubin: 0.5 mg/dL (ref 0.3–1.2)
Total Bilirubin: 0.7 mg/dL (ref 0.3–1.2)
Total Bilirubin: 0.8 mg/dL (ref 0.3–1.2)

## 2011-03-09 LAB — URINALYSIS, ROUTINE W REFLEX MICROSCOPIC
Bilirubin Urine: NEGATIVE
Bilirubin Urine: NEGATIVE
Glucose, UA: NEGATIVE mg/dL
Glucose, UA: NEGATIVE mg/dL
Ketones, ur: NEGATIVE mg/dL
Ketones, ur: NEGATIVE mg/dL
Ketones, ur: NEGATIVE mg/dL
Leukocytes, UA: NEGATIVE
Nitrite: NEGATIVE
Nitrite: NEGATIVE
Protein, ur: NEGATIVE mg/dL
Protein, ur: 30 mg/dL — AB
Protein, ur: NEGATIVE mg/dL

## 2011-03-09 LAB — DIFFERENTIAL
Basophils Absolute: 0 10*3/uL (ref 0.0–0.1)
Basophils Absolute: 0.1 10*3/uL (ref 0.0–0.1)
Basophils Relative: 1 % (ref 0–1)
Basophils Relative: 0 % (ref 0–1)
Eosinophils Absolute: 0.1 10*3/uL (ref 0.0–0.7)
Eosinophils Absolute: 0 10*3/uL (ref 0.0–0.7)
Eosinophils Absolute: 0 10*3/uL (ref 0.0–0.7)
Eosinophils Relative: 1 % (ref 0–5)
Eosinophils Relative: 1 % (ref 0–5)
Eosinophils Relative: 0 % (ref 0–5)
Monocytes Absolute: 0.7 10*3/uL (ref 0.1–1.0)
Neutrophils Relative %: 75 % (ref 43–77)

## 2011-03-09 LAB — LIPASE, BLOOD
Lipase: 63 U/L — ABNORMAL HIGH (ref 11–59)
Lipase: 39 U/L (ref 11–59)
Lipase: 26 U/L (ref 11–59)

## 2011-03-09 LAB — CBC
Hemoglobin: 13.3 g/dL (ref 12.0–15.0)
MCHC: 32.7 g/dL (ref 30.0–36.0)
MCHC: 33.7 g/dL (ref 30.0–36.0)
MCV: 91.2 fL (ref 78.0–100.0)
MCV: 91.4 fL (ref 78.0–100.0)
Platelets: 339 10*3/uL (ref 150–400)
RBC: 4.48 MIL/uL (ref 3.87–5.11)
RBC: 3.96 MIL/uL (ref 3.87–5.11)
RBC: 4.35 MIL/uL (ref 3.87–5.11)
RDW: 14.6 % (ref 11.5–15.5)
WBC: 7.2 10*3/uL (ref 4.0–10.5)
WBC: 9.4 10*3/uL (ref 4.0–10.5)

## 2011-03-09 LAB — POCT I-STAT, CHEM 8
BUN: 4 mg/dL — ABNORMAL LOW (ref 6–23)
Calcium, Ion: 1.2 mmol/L (ref 1.12–1.32)
HCT: 40 % (ref 36.0–46.0)
TCO2: 27 mmol/L (ref 0–100)

## 2011-03-09 LAB — URINE MICROSCOPIC-ADD ON

## 2011-03-09 LAB — HEMOCCULT GUIAC POC 1CARD (OFFICE): Fecal Occult Bld: NEGATIVE

## 2011-03-09 LAB — PREGNANCY, URINE: Preg Test, Ur: NEGATIVE

## 2011-04-06 NOTE — Discharge Summary (Signed)
NAME:  [place], [place]                 ACCOUNT NO.:  [id number]   MEDICAL RECORD NO.:  [id number]          PATIENT TYPE:  INP   LOCATION:  1341                         FACILITY:  [facility]   PHYSICIAN:  Michelene Gardener, MD    DATE OF BIRTH:  22-Oct-1971   DATE OF ADMISSION:  12/12/2008  DATE OF DISCHARGE:  12/13/2008                               DISCHARGE SUMMARY   DISCHARGE DIAGNOSES:  1. Viral gastroenteritis.  2. Urinary tract infection.  3. Hypokalemia.  4. Anemia.   DISCHARGE MEDICATIONS:  1. Phenergan 25 mg q.4 hours as needed.  2. Ciprofloxacin 500 mg p.o. q.12 hours x5 days.   CONSULTATIONS:  GI consult.   PROCEDURE:  EGD that showed normal findings.   RADIOLOGY STUDIES:  1. CT scan of the abdomen without contrast on January 10 showed no      acute abnormalities.  2. CT scan of the pelvis without contrast on January 21 showed no      acute abnormalities.  Follow up with primary doctor within 1-2      weeks.   COURSE OF HOSPITALIZATION:  This is a 40 year old African American  female with no significant past medical history who presented to the  hospital with complaint of abdominal pain, nausea and vomiting.  The  patient was admitted to the hospital for further evaluation.  CT scan of  the abdomen and pelvis was done and it came to be normal.  GI  consultation was requested because of recurrent ER visits with the same  findings.  The patient was taken for EGD which was totally normal.  Recommendations by GI to treat symptomatically with antiemetic  medications.  The patient will be discharged home today on Phenergan.  She will also be given ciprofloxacin for UTI.  At the present time her  urine cultures are negative and those will be followed.  Total  assessment time is 40 minutes.      Michelene Gardener, MD  Electronically Signed     NAE/MEDQ  D:  12/13/2008  T:  12/13/2008  Job:  [id number]

## 2011-04-06 NOTE — H&P (Signed)
NAME:  Beth Jacobs, Beth Jacobs                 ACCOUNT NO.:  [id number]   MEDICAL RECORD NO.:  [id number]          PATIENT TYPE:  EMS   LOCATION:  ED                           FACILITY:  [facility]   PHYSICIAN:  Michelene Gardener, MD    DATE OF BIRTH:  1970-12-25   DATE OF ADMISSION:  12/12/2008  DATE OF DISCHARGE:                              HISTORY & PHYSICAL   CHIEF COMPLAINT:  Increasing abdominal pain, nausea and vomiting.   HISTORY OF PRESENT ILLNESS:  This is a 40 year old female with no  significant past medical history who presented to the hospital with the  above-mentioned complaints.  This patient has been having increasing  abdominal pain associated with nausea and vomiting that has been going  on for around 2 weeks.  She came initially to the ER on January 9 and at  that time CT scan was done and it showed mild colitis.  Since that time  she has been having continuous abdominal pain associated with nausea and  vomiting.  She came to the ER almost 4 or 5 times.  The patient's pain  is epigastric pain.  It is sharp, mainly associated with eating.  She  had vomiting which she described as projectile and it included yellow  bile, but there is no blood.  Her diarrhea is on and off, but for the  last 2 days she does not have diarrhea.  Her last bowel movement was  yesterday.  Her workup from today including CAT scan and labs are  negative, except for mild hypokalemia.  The hospitalist service was  called for further evaluation.   PAST MEDICAL HISTORY:  Denied.   PAST SURGICAL HISTORY:  Denied.   ALLERGIES:  No known drug allergies.   MEDICATIONS:  Denied.   SOCIAL HISTORY:  No smoking, no alcohol drinking and no recreational  drugs.   FAMILY HISTORY:  No coronary artery disease, no hypertension, no  diabetes.   REVIEW OF SYSTEMS:  CONSTITUTIONAL:  Positive for fatigability.  EYES:  No blurred vision.  ENT:  No tinnitus.  RESPIRATORY:  No cough, no  wheezes.  CARDIOVASCULAR:  No chest  pain or shortness of breath.  GI:  Positive for nausea, vomiting, abdominal pain and frequent loose bowel  movements.  GU:  No dysuria or hematuria.  ENDOCRINE:  No polyuria or  nocturia.  HEMATOLOGY:  No bruises or bleeding.  ID:  No rash, no  lesions.  NEURO:  No numbness or tingling.  The rest of systems were  reviewed and they were negative.   PHYSICAL EXAMINATION:  VITAL SIGNS:  Temperature is 98, blood pressure  of 158/77, pulse 86, respiratory rate 20.  GENERAL APPEARANCE:  This is a young [place] American female not in  acute distress.  HEENT:  Her conjunctivae are pink.  Pupils are equal and reactive to  light.  There is no ptosis.  Hearing is intact.  There is no ear  discharge or infection.  There is no nose infection or bleeding.  Oral  mucosa dry.  No pharyngeal erythema.  NECK:  Supple.  No JVD, no carotid bruit, no lymphadenopathy, no thyroid  enlargement or thyroid tenderness.  CARDIOVASCULAR EXAMINATION:  S1 and S2 regular.  There are no murmurs,  no gallops and no thrills.  RESPIRATORY EXAMINATION:  The patient breathing is between 16-18.  There  is no rales, no rhonchi, no wheezes.  ABDOMINAL EXAM:  The abdomen is soft, distended.  There is mild  tenderness in the epigastric area.  There is no hepatosplenomegaly.  Bowel sounds are present.  LOWER EXTREMITIES:  No edema, no rash and no varicose veins.  SKIN:  No rash and no erythema.  NEURO:  Cranial nerves are intact from II-VII.   LAB RESULTS:  Lipase 21.  Sodium 140, potassium 3.2, chloride 106,  bicarb 25, glucose 87, BUN 3, creatinine 1.02.  Alkaline phosphatase 44,  AST 23, ALT 24. WBC 7.6, hemoglobin 11.5, hematocrit 35.6, platelet  count is 327.  Urinalysis is positive for UTI.  CT scan of the abdomen  is negative.  CT scan of the pelvis is negative.   IMPRESSION:  1. Viral gastroenteritis.  2. Urinary tract infection.  3. Hypokalemia.  4. Anemia.   PLAN:  1. Viral gastroenteritis.  This patient's  symptoms are suggestive of      viral gastroenteritis.  Her current problem that she had repeated      episodes and she continued to have symptoms and she has been coming      to the ER frequently.  I will admit her to the hospital.  We will      keep her on clear liquid diet.  We will start her on normal saline      at 100 mL an hour.  We will treat her symptomatically with nausea      medications and pain medicine.  We will get GI evaluation.  2. Urinary tract infection.  We will start her on ciprofloxacin and we      will get urine culture.  3. Hypokalemia.  We will repeat her potassium and repeat her potassium      level tomorrow.   Otherwise, her other medical conditions remain stable.   TOTAL ASSESSMENT TIME:  1 hour.      Michelene Gardener, MD  Electronically Signed     NAE/MEDQ  D:  12/12/2008  T:  12/12/2008  Job:  [id number]

## 2011-04-09 NOTE — Discharge Summary (Signed)
[facility]  Patient:    Beth Jacobs, Beth Jacobs                    MRN: [id number] Adm. Date:  03/09/01 Disc. Date: 03/10/01 Attending:  Gerrit Friends. Aldona Bar, M.D.                           Discharge Summary  DISCHARGE DIAGNOSES:          1. Term pregnancy, delivered 8-pound                                  4-ounce viable female infant, Apgars of 9 and                                  9.                               2. Blood type O positive.  PROCEDURES:                   1. Normal spontaneous delivery.                               2. Epidural anesthesia.  HOSPITAL COURSE:              This patient, a 40 year old gravida 6, para 1, was admitted at term in early labor.  She had a normal progression with an uncomplicated second stage and had a normal spontaneous delivery of a viable 8-pound 4-ounce female infant with Apgars of 9 and 9 over an intact perineum. Placenta required manual removal.  Some postpartum hemorrhaging was treated with massage, Pitocin, and Methergine with good results.  Mothers postpartum course was uncomplicated.  On the morning of March 10, 2001, she was afebrile, stable, bottle-feeding, not having any problems.  Hemoglobin was 8.6 with a wc of 14,800 and a platelet count of 231,000.  On the evening of March 10, 2001, she was stable, requesting discharge.  At that time she was ambulating well, tolerating a regular diet well, having normal bowel and bladder function, was afebrile.  Was having minimal bleeding.  Her fundus was firm and, accordingly, she was discharged to home.  DISCHARGE MEDICATIONS:        1. Vitamins 1 a day until gone.                               2. Ferrous sulfate 300 mg daily.                               3. Motrin 600 mg every six hours as needed for                                  some mild discomfort.                               4. Tylox 1-2 every four to six hours as needed  for  severe discomfort.  FOLLOW-UP:                    She will return to the office in follow-up in approximately four weeks time.  DISCHARGE INSTRUCTIONS:       She was given an instruction brochure at the time of discharge and understands all instructions well.  CONDITION ON DISCHARGE:       Improved. DD:  03/10/01 TD:  03/12/01 Job: [id number]

## 2011-04-09 NOTE — Op Note (Signed)
NAME:  Beth Jacobs, Beth Jacobs                             ACCOUNT NO.:  [id number]   MEDICAL RECORD NO.:  [id number]                   PATIENT TYPE:  INP   LOCATION:  9303                                 FACILITY:  [facility]   PHYSICIAN:  Enid Cutter, M.D.                  DATE OF BIRTH:  10/05/1971   DATE OF PROCEDURE:  07/12/2002  DATE OF DISCHARGE:  07/13/2002                                 OPERATIVE REPORT   PREOPERATIVE DIAGNOSES:  A 39 year old black female status post spontaneous  vaginal delivery of nonviable 22 week infant with retained placenta.   POSTOPERATIVE DIAGNOSES:  A 40 year old black female status post spontaneous  vaginal delivery of nonviable 22 week infant with retained placenta.   PROCEDURE:  Manual retraction of placenta and uterine curettage.   SURGEON:  Enid Cutter, M.D.   ANESTHESIA:  General.   COMPLICATIONS:  None.   ESTIMATED BLOOD LOSS:  350 cc during the procedure with a probable 1500-2000  cc total blood loss.   PATHOLOGY:  Placenta.   DISPOSITION:  Recovery room, stable.   INDICATIONS FOR PROCEDURE:  The patient is a 40 year old black female  gravida 8, para 2-1-4-3 who presented to the maternity admissions at  approximately [redacted] weeks gestation with a precipitous delivery of a nonviable  22 week infant.  The placenta was clamped and Pitocin was begun. The patient  had  approximately 40 units of Pitocin at the time I was called to see her.  She was having heavy vaginal bleeding and had passed several pads of large  clots.  By estimate at that time she had approximately 1000-1200 cc  estimated blood loss.  She began to have heavy bleeding despite giving  additional Pitocin.  The patient was counseled on the risks and benefits of  manual extraction and uterine curettage.  The patient was counseled on the  risks including bleeding, infection, injury to the uterus with perforation  or Asherman syndrome.  The patient agreed to proceed.   PROCEDURE:  The  patient was given general anesthesia.  She was placed in  Patterson stirrups and prepped and draped in sterile fashion.  Manual extraction  of placenta was performed reaching the fundus of the uterus with surgeon's  hands.  A large portion of the placenta was removed.  There were still  palpable membranes within the uterine cavity and banjo curette was  introduced into the uterine cavity to remove additional products of  conception.  Several passes were made and the products appeared to be  entirely removed.  The surgeon's hand was reintroduced and it was felt  that the uterine cavity was felt without additional retained products.  The  uterus was massaged and Methergine was given intraoperatively.  The patient  was then awakened, taken to the recovery room in stable condition.  Sponge,  instrument, and needle counts were correct at the  end of the procedure.                                               Enid Cutter, M.D.    EMH/MEDQ  D:  07/12/2002  T:  07/13/2002  Job:  [id number]

## 2011-04-09 NOTE — Op Note (Signed)
NAME:  Beth Jacobs, Beth Jacobs                 ACCOUNT NO.:  [id number]   MEDICAL RECORD NO.:  [id number]          PATIENT TYPE:  AMB   LOCATION:  SDS                          FACILITY:  [facility]   PHYSICIAN:  Marcelo Baldy, DDS, MDDATE OF BIRTH:  [birth year]   DATE OF PROCEDURE:  09/19/2006  DATE OF DISCHARGE:  09/19/2006                               OPERATIVE REPORT   PREOPERATIVE DIAGNOSIS:  Dental caries.   POSTOPERATIVE DIAGNOSIS:  Dental caries.   PROCEDURE:  Extraction of teeth numbers 1, 2, 16, 17 and 32.   ANESTHESIA:  General endotracheal anesthesia.   ESTIMATED BLOOD LOSS:  Minimal.   SPECIMENS:  None.   COMPLICATIONS:  None.   SURGEON:  Dr. Inda Coke.   PROCEDURE IN DETAIL:  The patient was brought to the operating room and  placed on the operating room table in the supine position.  Monitors  were placed by anesthesia team in standard fashion.  The patient was  then induced and intubated.  Bilateral breath sounds and end tidal CO2  confirmed accurate tube placement.  A moist [employer] sponge was then  placed in the patient's oropharynx as a throat pack.  Approximately 10  mL of 2% lidocaine with 1:100,000 epinephrine was infiltrated about the  local aspects of teeth numbers 1, 2, 16, 17 and 32 as well as PSA,  greater palatine and bilateral infra-alveolar blocks were given.  A  sulcular incision was made around teeth numbers 1 and 2.  A straight  dental elevator was used to elevate tooth #1 and it was then delivered  with forceps.  The socket was curetted thoroughly.  A straight dental  elevator was then used to elevate tooth #2 and it was delivered with the  forceps and the socket was curetted thoroughly.  Sockets were irrigated  thoroughly and a 3-0 chromic gut suture was used to re-approximate the  papilla.  A sulcular incision was created around tooth #16.  It was  elevated and delivered with forceps.  A sulcular incision was created  around tooth #17 with a  buccal hockey-stick release.  A buccal trough  was created.  The tooth was sectioned and delivered in segments.  The  area was irrigated thoroughly and the tissue was re-approximated with 3-  0 chromic gut suture.  We then turned out attention to tooth #32.  A  sulcular incision was created with a buccal hockey-stick release.  A  small mucoperiosteal flap was elevated.  A buccal trough was created and  the tooth was sectioned and removed in segments.  The socket was  irrigated thoroughly and 3-0 chromic  gut suture was used to re-approximate the soft tissue.  At this point,  the patient was allowed to awaken from anesthesia and was transferred to  the recovery room in stable condition.  There were no complications for  the procedure.  At the end of the procedure, all needle and sponge  counts were correct times two.      Marcelo Baldy, DDS, MD  Electronically Signed     TGB/MEDQ  D:  09/19/2006  T:  09/20/2006  Job:  [id number]

## 2011-04-09 NOTE — Discharge Summary (Signed)
   NAME:  [place], [place]                             ACCOUNT NO.:  [id number]   MEDICAL RECORD NO.:  [id number]                   PATIENT TYPE:  INP   LOCATION:  9303                                 FACILITY:  [facility]   PHYSICIAN:  Quintin Alto, M.D.                  DATE OF BIRTH:  1971-09-12   DATE OF ADMISSION:  07/12/2002  DATE OF DISCHARGE:  07/13/2002                                 DISCHARGE SUMMARY   DISCHARGE DIAGNOSES:  1. Status post preterm delivery of nonviable fetus at 22 weeks, likely     secondary to infection.  2. Status post dilatation and evacuation for retained placenta.  3. Anemia.   HOSPITAL COURSE:  This is a 40 year old G8, P2-1-4-2 who presented to the  MAU at [redacted] weeks gestational age fully dilated and in active labor.  She  proceeded to spontaneously deliver a 22 week fetus.  NICU team was present  and gestational age was consistent, thus no resuscitative measures were  undertaken.  Postpartum patient developed significant bleeding and was taken  to the OR for D&E as retained placenta.  The patient has been stable  throughout her hospitalization and is currently stable for discharge home.  She has remained afebrile and her vital signs are within normal limits.  Her  postoperative H&H was mildly decreased at 8.4 and 25%, respectively.  She  will follow up at the GYN clinic and receive Depo-Provera for birth control.  She has been advised to take iron supplementation as well as a multivitamin  for her anemia.   LABORATORY DATA:  The patient is O+, antibody negative.  RPR was  nonreactive.  H&H on presentation 11.1 and 32.5, at time of discharge 8.4  and 24.9%, respectively.  Urinalysis within normal limits.   FETAL DATA:  Female infant.  Weight 1 pound 7 ounces.  Length 13 inches.  Apgars 2 and 1.   DISCHARGE INSTRUCTIONS:  The patient is to follow up at GYN clinic in four  to six weeks for postpartum care.  She would notify her physician for any  persistent  bleeding, worsening of bleeding, any fevers, any abdominal pain,  or abnormal vaginal discharge.  Other instructions per routine discharge  instructions.   DISCHARGE MEDICATIONS:  1. Over-the-counter Motrin 200 mg three tablets q.6h. P.r.n. cramps.  2. Iron sulfate 325 mg p.o. b.i.d. x2 months.  3. Multivitamin.  4. Depo-Provera was given prior to discharge.                                               Quintin Alto, M.D.    SG/MEDQ  D:  07/13/2002  T:  07/13/2002  Job:  [id number]

## 2011-08-16 LAB — POCT URINALYSIS DIP (DEVICE)
Bilirubin Urine: NEGATIVE
Ketones, ur: NEGATIVE
Operator id: 200941
Protein, ur: NEGATIVE
Specific Gravity, Urine: 1.025
pH: 6

## 2011-08-17 LAB — CBC
MCV: 90.3
Platelets: 349
RBC: 3.87
WBC: 8.2

## 2011-08-17 LAB — URINALYSIS, ROUTINE W REFLEX MICROSCOPIC
Glucose, UA: NEGATIVE
Ketones, ur: 15 — AB
Nitrite: NEGATIVE
Specific Gravity, Urine: 1.025
pH: 5.5

## 2011-08-17 LAB — POCT PREGNANCY, URINE: Operator id: 134401

## 2011-08-17 LAB — HCG, QUANTITATIVE, PREGNANCY: hCG, Beta Chain, Quant, S: 76555 — ABNORMAL HIGH

## 2011-08-17 LAB — SAMPLE TO BLOOD BANK

## 2011-08-17 LAB — WET PREP, GENITAL: Clue Cells Wet Prep HPF POC: NONE SEEN

## 2011-08-17 LAB — GC/CHLAMYDIA PROBE AMP, GENITAL
Chlamydia, DNA Probe: NEGATIVE
GC Probe Amp, Genital: NEGATIVE

## 2012-04-10 ENCOUNTER — Encounter (HOSPITAL_COMMUNITY): Payer: Self-pay | Admitting: *Deleted

## 2012-04-10 ENCOUNTER — Inpatient Hospital Stay (HOSPITAL_COMMUNITY): Payer: Self-pay

## 2012-04-10 ENCOUNTER — Inpatient Hospital Stay (HOSPITAL_COMMUNITY)
Admission: AD | Admit: 2012-04-10 | Discharge: 2012-04-10 | Disposition: A | Discharge status: Home / Self Care | Payer: Self-pay | Source: Ambulatory Visit | Attending: Obstetrics & Gynecology | Admitting: Obstetrics & Gynecology

## 2012-04-10 DIAGNOSIS — O98819 Other maternal infectious and parasitic diseases complicating pregnancy, unspecified trimester: Secondary | ICD-10-CM | POA: Insufficient documentation

## 2012-04-10 DIAGNOSIS — O3410 Maternal care for benign tumor of corpus uteri, unspecified trimester: Secondary | ICD-10-CM | POA: Insufficient documentation

## 2012-04-10 DIAGNOSIS — A599 Trichomoniasis, unspecified: Secondary | ICD-10-CM

## 2012-04-10 DIAGNOSIS — A5901 Trichomonal vulvovaginitis: Secondary | ICD-10-CM | POA: Insufficient documentation

## 2012-04-10 DIAGNOSIS — F1410 Cocaine abuse, uncomplicated: Secondary | ICD-10-CM | POA: Insufficient documentation

## 2012-04-10 DIAGNOSIS — D259 Leiomyoma of uterus, unspecified: Secondary | ICD-10-CM | POA: Insufficient documentation

## 2012-04-10 DIAGNOSIS — O99340 Other mental disorders complicating pregnancy, unspecified trimester: Secondary | ICD-10-CM | POA: Insufficient documentation

## 2012-04-10 DIAGNOSIS — K148 Other diseases of tongue: Secondary | ICD-10-CM

## 2012-04-10 DIAGNOSIS — R1031 Right lower quadrant pain: Secondary | ICD-10-CM | POA: Insufficient documentation

## 2012-04-10 DIAGNOSIS — O99321 Drug use complicating pregnancy, first trimester: Secondary | ICD-10-CM

## 2012-04-10 LAB — URINALYSIS, ROUTINE W REFLEX MICROSCOPIC
Bilirubin Urine: NEGATIVE
Glucose, UA: NEGATIVE mg/dL
Hgb urine dipstick: NEGATIVE
Specific Gravity, Urine: >1.03 — ABNORMAL HIGH (ref 1.005–1.030)
Urobilinogen, UA: 1 mg/dL (ref 0.0–1.0)
pH: 5.5 (ref 5.0–8.0)

## 2012-04-10 LAB — RAPID URINE DRUG SCREEN, HOSP PERFORMED
Barbiturates: NOT DETECTED
Benzodiazepines: NOT DETECTED
Cocaine: POSITIVE — AB
Opiates: NOT DETECTED
Tetrahydrocannabinol: NOT DETECTED

## 2012-04-10 LAB — CBC
HCT: 36.4 % (ref 36.0–46.0)
Hemoglobin: 11.8 g/dL — ABNORMAL LOW (ref 12.0–15.0)
MCH: 29.2 pg (ref 26.0–34.0)
MCHC: 32.4 g/dL (ref 30.0–36.0)
MCV: 90.1 fL (ref 78.0–100.0)
RBC: 4.04 MIL/uL (ref 3.87–5.11)

## 2012-04-10 LAB — WET PREP, GENITAL: Yeast Wet Prep HPF POC: NONE SEEN

## 2012-04-10 LAB — URINE MICROSCOPIC-ADD ON

## 2012-04-10 LAB — ABO/RH: ABO/RH(D): O POS

## 2012-04-10 MED ORDER — RANITIDINE HCL 150 MG PO TABS: 150.0000 mg | ORAL_TABLET | Freq: Two times a day (BID) | ORAL | Status: DC

## 2012-04-10 MED ORDER — GI COCKTAIL ~~LOC~~
30.0000 mL | ORAL | Status: AC
  Administered 2012-04-10: 30 mL via ORAL
  Filled 2012-04-10: qty 30

## 2012-04-10 MED ORDER — METRONIDAZOLE 500 MG PO TABS
2000.0000 mg | ORAL_TABLET | ORAL | Status: AC
  Administered 2012-04-10: 2000 mg via ORAL
  Filled 2012-04-10: qty 4

## 2012-04-10 MED ORDER — DIPHENHYDRAMINE HCL 25 MG PO CAPS
50.0000 mg | ORAL_CAPSULE | ORAL | Status: AC
Start: 2012-04-10 — End: 2012-04-10
  Administered 2012-04-10: 50 mg via ORAL
  Filled 2012-04-10: qty 2

## 2012-04-10 NOTE — MAU Note (Signed)
Pt is very restless, moving constantly in bed.  Is somewhat slow to answer questions, changes her answers to questions.  Seems possibly confused @ times.

## 2012-04-10 NOTE — Discharge Instructions (Signed)
Trichomoniasis Trichomoniasis is an infection, caused by the Trichomonas organism, that affects both women and men. In women, the outer female genitalia and the vagina are affected. In men, the penis is mainly affected, but the prostate and other reproductive organs can also be involved. Trichomoniasis is a sexually transmitted disease (STD) and is most often passed to another person through sexual contact. The majority of people who get trichomoniasis do so from a sexual encounter and are also at risk for other STDs. CAUSES   Sexual intercourse with an infected partner.   It can be present in swimming pools or hot tubs.  SYMPTOMS   Abnormal gray-green frothy vaginal discharge in women.   Vaginal itching and irritation in women.   Itching and irritation of the area outside the vagina in women.   Penile discharge with or without pain in males.   Inflammation of the urethra (urethritis), causing painful urination.   Bleeding after sexual intercourse.  RELATED COMPLICATIONS  Pelvic inflammatory disease.   Infection of the uterus (endometritis).   Infertility.   Tubal (ectopic) pregnancy.   It can be associated with other STDs, including gonorrhea and chlamydia, hepatitis B, and HIV.  COMPLICATIONS DURING PREGNANCY  Early (premature) delivery.   Premature rupture of the membranes (PROM).   Low birth weight.  DIAGNOSIS   Visualization of Trichomonas under the microscope from the vagina discharge.   Ph of the vagina greater than 4.5, tested with a test tape.   Trich Rapid Test.   Culture of the organism, but this is not usually needed.   It may be found on a Pap test.   Having a "strawberry cervix,"which means the cervix looks very red like a strawberry.  TREATMENT   You may be given medication to fight the infection. Inform your caregiver if you could be or are pregnant. Some medications used to treat the infection should not be taken during pregnancy.    Over-the-counter medications or creams to decrease itching or irritation may be recommended.   Your sexual partner will need to be treated if infected.  HOME CARE INSTRUCTIONS   Take all medication prescribed by your caregiver.   Take over-the-counter medication for itching or irritation as directed by your caregiver.   Do not have sexual intercourse while you have the infection.   Do not douche or wear tampons.   Discuss your infection with your partner, as your partner may have acquired the infection from you. Or, your partner may have been the person who transmitted the infection to you.   Have your sex partner examined and treated if necessary.   Practice safe, informed, and protected sex.   See your caregiver for other STD testing.  SEEK MEDICAL CARE IF:   You still have symptoms after you finish the medication.   You have an oral temperature above 102 F (38.9 C).   You develop belly (abdominal) pain.   You have pain when you urinate.   You have bleeding after sexual intercourse.   You develop a rash.   The medication makes you sick or makes you throw up (vomit).  Document Released: 05/04/2001 Document Revised: 10/28/2011 Document Reviewed: 05/30/2009 [facility] Patient Information 2012 [place], [place]. Cocaine Abuse PROBLEMS FROM USING COCAINE:   Highly addictive.   Illegal.   Risk of sudden death.   Heart disease.   Irregular heart beat.   High blood pressure.   Damage to nose and lungs.   Severe agitation.   Hallucinations.   Violent behavior.  Paranoia.   Sexual dysfunction.  Most cocaine users deny that they have a problem with addiction. The biggest problem is admitting that you are dependent on cocaine. Those trying to quit using it may experience depression and withdrawal symptoms. Other withdrawal symptoms include fatigue, suicidal thoughts, sleepiness, restlessness, anxiety, and increased craving for cocaine. There are medications  available to help prevent depression associated with stopping cocaine. Most users will find a support group or treatment program helpful in coming off and staying off cocaine. The best chance to cure cocaine addiction is to go into group therapy and to be in a drug-free environment. It is very important to develop healthy relationships and avoid socializing with people who use or deal drugs. Eat well, and give your body the proper rest and healthy exercise it needs. You may need medication to help treat withdrawal symptoms. Call your caregiver or a drug treatment center for more help.  You may also want to call the [facility] on Drug Abuse at 800-662-HELP in the [place] SEEK IMMEDIATE MEDICAL CARE IF:  You develop severe chest pain.   You develop shortness of breath.   You develop extreme agitation.  Document Released: 12/16/2004 Document Revised: 10/28/2011 Document Reviewed: 09/10/2009 [facility] Patient Information 2012 [place], [place].

## 2012-04-10 NOTE — MAU Provider Note (Signed)
Attestation of Attending Supervision of Advanced Practitioner: Evaluation and management procedures were performed by the [facility] Fellow/PA/CNM/NP under my supervision and collaboration. Chart reviewed, and agree with management and plan.  Jaynie Collins, M.D. 04/10/2012 5:13 PM

## 2012-04-10 NOTE — MAU Note (Signed)
Pt states she started having abd pain yesterday, it became severe during the night, hurts all across the top of her abdomen & down L side of abdomen.  Pt unsure if she is pregnant.  Pt denies vag bleeding.  Pt also states her tongue is swollen.

## 2012-04-10 NOTE — MAU Provider Note (Signed)
[place] C [id number] @[redacted]w[redacted]d  by LMP Chief Complaint  Patient presents with  . Abdominal Pain     None     SUBJECTIVE  HPI: Pt presents with upper and lower abdominal pain.  She reports that pain started 2 days ago in RLQ and is now in upper abdomen/epigastric area.  She vomited x3-4 yesterday but none today.  She also reports her tongue is swollen and is having some difficulty with speech because of this.  She denies eating anything unusual or taking any new medications in last 48 hours.  She had this reaction once before to tomatoes but has not eaten tomatoes recently.  She has had problems in the past with epigastric pain and n/v not during pregnancy.  Patient's last menstrual period was 02/25/2012.  She denies LOF, vaginal bleeding, vaginal itching/burning, urinary symptoms, h/a, dizziness, n/v today, or fever/chills.    Past Medical History  Diagnosis Date  . No pertinent past medical history    Past Surgical History  Procedure Date  . Breast surgery    History   Social History  . Marital Status: Divorced    Spouse Name: N/A    Number of Children: N/A  . Years of Education: N/A   Occupational History  . Not on file.   Social History Main Topics  . Smoking status: Never Smoker   . Smokeless tobacco: Not on file  . Alcohol Use: No  . Drug Use: No  . Sexually Active: Yes    Birth Control/ Protection: None   Other Topics Concern  . Not on file   Social History Narrative  . No narrative on file   No current facility-administered medications on file prior to encounter.   No current outpatient prescriptions on file prior to encounter.   No Known Allergies  ROS: Pertinent items in HPI  OBJECTIVE Blood pressure 122/54, pulse 96, temperature 98.3 F (36.8 C), temperature source Oral, resp. rate 22, height 5\' 1"  (1.549 m), weight 72.938 kg (160 lb 12.8 oz), last menstrual period 02/25/2012.  GENERAL: Well-developed, well-nourished female in no acute  distress.  HEENT: Normocephalic, good dentition HEART: normal rate RESP: normal effort ABDOMEN: Soft, nontender EXTREMITIES: Nontender, no edema NEURO: Alert and oriented Pelvic exam: Cervix pink, visually closed, without lesion, scant clear discharge Bimanual exam: Cervix 0/long/high, soft, anterior, neg CMT, uterus tender upon palpation, slightly enlarged, adnexa without enlargement or mass    LAB RESULTS Results for orders placed during the hospital encounter of 04/10/12 (from the past 24 hour(s))  URINALYSIS, ROUTINE W REFLEX MICROSCOPIC     Status: Abnormal   Collection Time   04/10/12  9:32 AM      Component Value Range   Color, Urine YELLOW  YELLOW    APPearance CLEAR  CLEAR    Specific Gravity, Urine >1.030 (*) 1.005 - 1.030    pH 5.5  5.0 - 8.0    Glucose, UA NEGATIVE  NEGATIVE (mg/dL)   Hgb urine dipstick NEGATIVE  NEGATIVE    Bilirubin Urine NEGATIVE  NEGATIVE    Ketones, ur 15 (*) NEGATIVE (mg/dL)   Protein, ur 30 (*) NEGATIVE (mg/dL)   Urobilinogen, UA 1.0  0.0 - 1.0 (mg/dL)   Nitrite NEGATIVE  NEGATIVE    Leukocytes, UA NEGATIVE  NEGATIVE   URINE MICROSCOPIC-ADD ON     Status: Abnormal   Collection Time   04/10/12  9:32 AM      Component Value Range   Squamous Epithelial / LPF FEW (*) RARE  RBC / HPF 0-2  <3 (RBC/hpf)   Urine-Other MUCOUS PRESENT    URINE RAPID DRUG SCREEN (HOSP PERFORMED)     Status: Abnormal   Collection Time   04/10/12  9:33 AM      Component Value Range   Opiates NONE DETECTED  NONE DETECTED    Cocaine POSITIVE (*) NONE DETECTED    Benzodiazepines NONE DETECTED  NONE DETECTED    Amphetamines NONE DETECTED  NONE DETECTED    Tetrahydrocannabinol NONE DETECTED  NONE DETECTED    Barbiturates NONE DETECTED  NONE DETECTED   POCT PREGNANCY, URINE     Status: Abnormal   Collection Time   04/10/12  9:38 AM      Component Value Range   Preg Test, Ur POSITIVE (*) NEGATIVE   CBC     Status: Abnormal   Collection Time   04/10/12  9:44 AM       Component Value Range   WBC 7.6  4.0 - 10.5 (K/uL)   RBC 4.04  3.87 - 5.11 (MIL/uL)   Hemoglobin 11.8 (*) 12.0 - 15.0 (g/dL)   HCT 56.2  13.0 - 86.5 (%)   MCV 90.1  78.0 - 100.0 (fL)   MCH 29.2  26.0 - 34.0 (pg)   MCHC 32.4  30.0 - 36.0 (g/dL)   RDW 78.4  69.6 - 29.5 (%)   Platelets 324  150 - 400 (K/uL)  ABO/RH     Status: Normal   Collection Time   04/10/12  9:44 AM      Component Value Range   ABO/RH(D) O POS    HCG, QUANTITATIVE, PREGNANCY     Status: Abnormal   Collection Time   04/10/12  9:44 AM      Component Value Range   hCG, Beta Chain, Quant, S 16014 (*) <5 (mIU/mL)  WET PREP, GENITAL     Status: Abnormal   Collection Time   04/10/12 10:00 AM      Component Value Range   Yeast Wet Prep HPF POC NONE SEEN  NONE SEEN    Trich, Wet Prep FEW (*) NONE SEEN    Clue Cells Wet Prep HPF POC FEW (*) NONE SEEN    WBC, Wet Prep HPF POC FEW (*) NONE SEEN      IMAGING   ASSESSMENT UDS pos for cocaine Trichomonas Uterine fibroid IUP [redacted]w[redacted]d with bradycardia  PLAN In MAU: Benadryl 50 mg PO Flagyl 2g x1 dose  D/C home Offered pt opportunity to see Child psychotherapist for substance abuse treatment but pt reports she only used cocaine x1 and does not need treatment Discussed poor prognosis based on U/S Zantac 150 mg PO BID  F/U with early prenatal care Return to MAU as needed     LEFTWICH-KIRBY, Meldrick Buttery 04/10/2012 10:09 AM

## 2012-04-11 LAB — URINE CULTURE
Colony Count: NO GROWTH
Culture  Setup Time: 201305201503

## 2012-04-19 ENCOUNTER — Telehealth (HOSPITAL_COMMUNITY): Payer: Self-pay | Admitting: Advanced Practice Midwife

## 2012-04-19 NOTE — Telephone Encounter (Signed)
Telephone call to patient regarding positive GC culture, patient not in, "voice mail not available".  Certified letter mailed.  Report faxed to health department.

## 2012-04-28 NOTE — Telephone Encounter (Signed)
Certified letter to [address] returned for "insufficient address".

## 2012-08-07 ENCOUNTER — Inpatient Hospital Stay (HOSPITAL_COMMUNITY)
Admission: AD | Admit: 2012-08-07 | Discharge: 2012-08-07 | Disposition: A | Discharge status: Home / Self Care | Payer: Medicaid Other | Source: Ambulatory Visit | Attending: Family Medicine | Admitting: Family Medicine

## 2012-08-07 ENCOUNTER — Encounter (HOSPITAL_COMMUNITY): Payer: Self-pay | Admitting: *Deleted

## 2012-08-07 ENCOUNTER — Inpatient Hospital Stay (HOSPITAL_COMMUNITY): Payer: Medicaid Other

## 2012-08-07 DIAGNOSIS — O99891 Other specified diseases and conditions complicating pregnancy: Secondary | ICD-10-CM | POA: Insufficient documentation

## 2012-08-07 DIAGNOSIS — K219 Gastro-esophageal reflux disease without esophagitis: Secondary | ICD-10-CM

## 2012-08-07 DIAGNOSIS — Z331 Pregnant state, incidental: Secondary | ICD-10-CM

## 2012-08-07 DIAGNOSIS — R109 Unspecified abdominal pain: Secondary | ICD-10-CM | POA: Insufficient documentation

## 2012-08-07 DIAGNOSIS — Z3490 Encounter for supervision of normal pregnancy, unspecified, unspecified trimester: Secondary | ICD-10-CM

## 2012-08-07 LAB — COMPREHENSIVE METABOLIC PANEL
ALT: 14 U/L (ref 0–35)
Alkaline Phosphatase: 49 U/L (ref 39–117)
CO2: 22 mEq/L (ref 19–32)
Chloride: 102 mEq/L (ref 96–112)
GFR calc Af Amer: >90 mL/min (ref >90)
GFR calc non Af Amer: >90 mL/min (ref >90)
Glucose, Bld: 82 mg/dL (ref 70–99)
Potassium: 3.4 mEq/L — ABNORMAL LOW (ref 3.5–5.1)
Sodium: 135 mEq/L (ref 135–145)
Total Bilirubin: 0.6 mg/dL (ref 0.3–1.2)

## 2012-08-07 LAB — URINALYSIS, ROUTINE W REFLEX MICROSCOPIC
Bilirubin Urine: NEGATIVE
Hgb urine dipstick: NEGATIVE
Nitrite: NEGATIVE
Protein, ur: NEGATIVE mg/dL
Urobilinogen, UA: 0.2 mg/dL (ref 0.0–1.0)

## 2012-08-07 LAB — CBC
HCT: 30.7 % — ABNORMAL LOW (ref 36.0–46.0)
Hemoglobin: 10.1 g/dL — ABNORMAL LOW (ref 12.0–15.0)
RBC: 3.38 MIL/uL — ABNORMAL LOW (ref 3.87–5.11)

## 2012-08-07 LAB — RAPID URINE DRUG SCREEN, HOSP PERFORMED
Barbiturates: NOT DETECTED
Opiates: NOT DETECTED
Tetrahydrocannabinol: POSITIVE — AB

## 2012-08-07 MED ORDER — PANTOPRAZOLE SODIUM 20 MG PO TBEC: 20.0000 mg | DELAYED_RELEASE_TABLET | Freq: Every day | ORAL | Status: DC

## 2012-08-07 MED ORDER — CEFTRIAXONE SODIUM 250 MG IJ SOLR
250.0000 mg | Freq: Once | INTRAMUSCULAR | Status: AC
  Administered 2012-08-07: 250 mg via INTRAMUSCULAR
  Filled 2012-08-07: qty 250

## 2012-08-07 MED ORDER — PANTOPRAZOLE SODIUM 40 MG PO TBEC
40.0000 mg | DELAYED_RELEASE_TABLET | Freq: Once | ORAL | Status: AC
  Administered 2012-08-07: 40 mg via ORAL
  Filled 2012-08-07: qty 1

## 2012-08-07 MED ORDER — PRENATAL VITAMINS (DIS) PO TABS: 1.0000 | ORAL_TABLET | ORAL | Status: DC

## 2012-08-07 NOTE — MAU Provider Note (Signed)
Chief Complaint:  Abdominal Pain  First Provider Initiated Contact with Patient 08/07/12 1901    HPI: Beth Jacobs is a 41 y.o. [id number] at 40w3dwho presents to maternity admissions by EMS reporting upper abdominal pain, nausea and vomiting since yesterday. States she vomited 3 times yesterday. No vomiting today but unable to eat do to nausea. Denies nausea currently. Denies fever, chills, diarrhea, constipation, contractions, leakage of fluid or vaginal bleeding. Good fetal movement. Has not started prenatal care. Seen in MAU May 2013. Diagnosed with gonorrhea, but hospital staff unable to reach her by phone or certified letter. States she has not been treated.  Pregnancy Course: No prenatal care.  Past Medical History: Past Medical History  Diagnosis Date  . No pertinent past medical history     Past obstetric history: OB History    Grav Para Term Preterm Abortions TAB SAB Ect Mult Living   7 3 3  3  3   3      # Outc Date GA Lbr Len/2nd Wgt Sex Del Anes PTL Lv   1 CUR            2 TRM            3 TRM            4 TRM            5 SAB            6 SAB            7 SAB               Past Surgical History: Past Surgical History  Procedure Date  . Breast surgery     Family History: No family history on file.  Social History: History  Substance Use Topics  . Smoking status: Never Smoker   . Smokeless tobacco: Not on file  . Alcohol Use: No    Allergies: No Known Allergies  Meds:  Prescriptions prior to admission  Medication Sig Dispense Refill  . ranitidine (ZANTAC) 150 MG tablet Take 1 tablet (150 mg total) by mouth 2 (two) times daily.  60 tablet  0    ROS: Pertinent findings in history of present illness.  Physical Exam  Blood pressure 95/63, pulse 112, temperature 97 F (36.1 C), temperature source Oral, resp. rate 18, height 5\' 2"  (1.575 m), weight 72.576 kg (160 lb), last menstrual period 02/25/2012. Patient Vitals for the past 24 hrs:  BP Temp Temp  src Pulse Resp Height Weight  08/07/12 2029 110/45 mmHg - - 99  20  - -  08/07/12 1831 95/63 mmHg 97 F (36.1 C) Oral 112  18  5\' 2"  (1.575 m) 72.576 kg (160 lb)    GENERAL: Well-developed, well-nourished female in mild distress. Patient is sedated, eyes closed, becomes irritable with questioning.  HEENT: normocephalic HEART: normal rate RESP: normal effort ABDOMEN: Distended, mild epigastric tenderness, gravid appropriate for gestational age. Positive bowel sounds x4. No CVA tenderness EXTREMITIES: Nontender, no edema NEURO: alert and oriented SPECULUM EXAM: Deferred   FHT:  Baseline 150 , moderate variability, no accelerations present, no decelerations Contractions: none   Labs: Results for orders placed during the hospital encounter of 08/07/12 (from the past 24 hour(s))  URINALYSIS, ROUTINE W REFLEX MICROSCOPIC     Status: Abnormal   Collection Time   08/07/12  6:30 PM      Component Value Range   Color, Urine YELLOW  YELLOW   APPearance CLEAR  CLEAR   Specific Gravity, Urine >1.030 (*) 1.005 - 1.030   pH 6.0  5.0 - 8.0   Glucose, UA NEGATIVE  NEGATIVE mg/dL   Hgb urine dipstick NEGATIVE  NEGATIVE   Bilirubin Urine NEGATIVE  NEGATIVE   Ketones, ur >80 (*) NEGATIVE mg/dL   Protein, ur NEGATIVE  NEGATIVE mg/dL   Urobilinogen, UA 0.2  0.0 - 1.0 mg/dL   Nitrite NEGATIVE  NEGATIVE   Leukocytes, UA NEGATIVE  NEGATIVE  CBC     Status: Abnormal   Collection Time   08/07/12  8:00 PM      Component Value Range   WBC 9.2  4.0 - 10.5 K/uL   RBC 3.38 (*) 3.87 - 5.11 MIL/uL   Hemoglobin 10.1 (*) 12.0 - 15.0 g/dL   HCT 16.1 (*) 09.6 - 04.5 %   MCV 90.8  78.0 - 100.0 fL   MCH 29.9  26.0 - 34.0 pg   MCHC 32.9  30.0 - 36.0 g/dL   RDW 40.9  81.1 - 91.4 %   Platelets 326  150 - 400 K/uL  COMPREHENSIVE METABOLIC PANEL     Status: Abnormal   Collection Time   08/07/12  8:00 PM      Component Value Range   Sodium 135  135 - 145 mEq/L   Potassium 3.4 (*) 3.5 - 5.1 mEq/L   Chloride 102   96 - 112 mEq/L   CO2 22  19 - 32 mEq/L   Glucose, Bld 82  70 - 99 mg/dL   BUN 7  6 - 23 mg/dL   Creatinine, Ser 7.82  0.50 - 1.10 mg/dL   Calcium 9.1  8.4 - 95.6 mg/dL   Total Protein 7.4  6.0 - 8.3 g/dL   Albumin 3.5  3.5 - 5.2 g/dL   AST 27  0 - 37 U/L   ALT 14  0 - 35 U/L   Alkaline Phosphatase 49  39 - 117 U/L   Total Bilirubin 0.6  0.3 - 1.2 mg/dL   GFR calc non Af Amer >90  >90 mL/min   GFR calc Af Amer >90  >90 mL/min    Imaging:  US Abdomen Limited Ruq  08/07/2012  *RADIOLOGY REPORT*  Clinical Data:  Epigastric pain.  [redacted] weeks pregnant.  LIMITED ABDOMINAL ULTRASOUND - RIGHT UPPER QUADRANT  Comparison:  None.  Findings:  Gallbladder:  Normal gallbladder.  No gallstones or gallbladder wall thickening.  Diffuse abdominal pain is present.  Common bile duct:  2.2 mm  Liver:  Normal  IMPRESSION: Negative                    Original Report Authenticated By: Camelia Phenes, M.D.    ED Course Offered GI cocktail and antiemetics. Patient declined. States she is hungry and thirsty. Will give Protonix after gallbladder ultrasound and advance diet as tolerated. 2100: Care of patient turned over to Wynelle Bourgeois, CNM  Dorathy Kinsman, [facility] 08/07/2012 9:13 PM  Assessment: 1. Intrauterine pregnancy   2. Acid reflux      Plan: Discharge home Labor precautions and fetal kick counts      Medication List     As of 08/07/2012 10:09 PM    TAKE these medications         pantoprazole 20 MG tablet   Commonly known as: PROTONIX   Take 1 tablet (20 mg total) by mouth daily.      Prenatal Vitamins (DIS) Tabs   Take 1  tablet by mouth 1 day or 1 dose.         Feels somewhat better  Discharge home  Wynelle Bourgeois CNM

## 2012-08-07 NOTE — MAU Note (Signed)
C/o abdominal pain rated at 9 (around her belly button) but can hardly stay awake long enough for RN to ask triage questions; slurred speech and states that she has an extremely dry mouth and is very thirsty;  ? under the influence of some type of alcohol or med; requesting something stronger than Tylenol for pain;

## 2012-08-07 NOTE — MAU Note (Signed)
C/o abdominal pain around her umbilicus for past 2 days; c/o N&V since yesterday;

## 2012-08-07 NOTE — MAU Provider Note (Signed)
Chart reviewed and agree with management and plan.  

## 2012-08-08 NOTE — Progress Notes (Signed)
Rx for PNV did not go through, so I called it in to the [facility]

## 2012-10-17 LAB — PROCEDURE REPORT - SCANNED: PAP SMEAR: NEGATIVE

## 2012-10-26 ENCOUNTER — Other Ambulatory Visit (HOSPITAL_COMMUNITY): Payer: Self-pay | Admitting: Advanced Practice Midwife

## 2012-10-26 ENCOUNTER — Ambulatory Visit (HOSPITAL_COMMUNITY)
Admission: RE | Admit: 2012-10-26 | Discharge: 2012-10-26 | Disposition: A | Discharge status: Home / Self Care | Payer: Medicaid Other | Source: Ambulatory Visit | Attending: Obstetrics | Admitting: Obstetrics

## 2012-10-26 ENCOUNTER — Encounter (HOSPITAL_COMMUNITY): Payer: Self-pay

## 2012-10-26 ENCOUNTER — Encounter: Payer: Self-pay | Admitting: Advanced Practice Midwife

## 2012-10-26 DIAGNOSIS — O09899 Supervision of other high risk pregnancies, unspecified trimester: Secondary | ICD-10-CM | POA: Insufficient documentation

## 2012-10-26 DIAGNOSIS — F1910 Other psychoactive substance abuse, uncomplicated: Secondary | ICD-10-CM | POA: Insufficient documentation

## 2012-10-26 DIAGNOSIS — Z3490 Encounter for supervision of normal pregnancy, unspecified, unspecified trimester: Secondary | ICD-10-CM

## 2012-10-26 DIAGNOSIS — O09529 Supervision of elderly multigravida, unspecified trimester: Secondary | ICD-10-CM

## 2012-10-26 DIAGNOSIS — O09299 Supervision of pregnancy with other poor reproductive or obstetric history, unspecified trimester: Secondary | ICD-10-CM | POA: Insufficient documentation

## 2012-10-26 DIAGNOSIS — F1920 Other psychoactive substance dependence, uncomplicated: Secondary | ICD-10-CM | POA: Insufficient documentation

## 2012-10-26 DIAGNOSIS — Z8751 Personal history of pre-term labor: Secondary | ICD-10-CM | POA: Insufficient documentation

## 2012-10-26 DIAGNOSIS — O0930 Supervision of pregnancy with insufficient antenatal care, unspecified trimester: Secondary | ICD-10-CM | POA: Insufficient documentation

## 2012-10-26 DIAGNOSIS — O358XX0 Maternal care for other (suspected) fetal abnormality and damage, not applicable or unspecified: Secondary | ICD-10-CM | POA: Insufficient documentation

## 2012-10-26 DIAGNOSIS — Z1389 Encounter for screening for other disorder: Secondary | ICD-10-CM | POA: Insufficient documentation

## 2012-10-26 DIAGNOSIS — F1410 Cocaine abuse, uncomplicated: Secondary | ICD-10-CM | POA: Insufficient documentation

## 2012-10-26 DIAGNOSIS — O3410 Maternal care for benign tumor of corpus uteri, unspecified trimester: Secondary | ICD-10-CM | POA: Insufficient documentation

## 2012-10-26 DIAGNOSIS — O09219 Supervision of pregnancy with history of pre-term labor, unspecified trimester: Secondary | ICD-10-CM

## 2012-10-26 DIAGNOSIS — Z363 Encounter for antenatal screening for malformations: Secondary | ICD-10-CM | POA: Insufficient documentation

## 2012-10-26 NOTE — Progress Notes (Addendum)
[facility] ultrasound  Indication: 41 yr old G6P2123 at [redacted]w[redacted]d with history of drug use, preterm delivery, and poor prenatal care for fetal anatomic survey.  Findings: 1. Single intrauterine pregnancy. 2. Estimated fetal weight is in the 83rd%. 3. Posterior placenta without evidence of previa. 4. Normal amniotic fluid index. 5. The anatomic survey is limited by advanced gestational age; no abnormalities were seen. The bowel appears dilated for gestational age although likely normal for gestational age. 6. Normal biophysical profile of 8/8. 7. Small right fibroid measuring 1.4cm.  Recommendations: 1. Appropriate fetal growth. 2. Normal limited anatomy survey. 3. Advanced maternal age: - patient met with genetic counselor; see separate report - the patient did not have fetal aneuploidy screening done  Discussed with maternal age over 53 there is increased risk of gestational diabetes, fetal growth restriction, need for Cesarean delivery, and stillbirth. Appropriate fetal growth today. Recommend antenatal testing with either weekly biophysical profiles or twice weekly nonstress tests and weekly amniotic fluid index starting at 36 weeks Recommend delivery by estimated due date 4. History of preterm delivery: - no consult requested - has had 2 full term deliveries in addition - recommend preterm labor precautions; consider 89 [place] progesterone in future pregnancies 5. Possible dilated bowel: likely normal for this gestational age; recommend inform Pediatricians at time of delivery.  Eulis Foster, MD

## 2012-10-26 NOTE — Progress Notes (Signed)
Genetic Counseling  High-Risk Gestation Note  Appointment Date:  10/26/2012 Referred By: Aviva Signs, CNM Date of Birth:  1971/05/06 Partner:  Beth Jacobs    Pregnancy History: [id number] Estimated Date of Delivery: 12/01/12 Estimated Gestational Age: [redacted]w[redacted]d Attending: Eulis Foster, MD   Ms. Beth Jacobs and her partner, Mr. Beth Jacobs, were seen for genetic counseling because of a maternal age of 41 y.o.Marland Kitchen     They were counseled regarding maternal age and the association with risk for chromosome conditions due to nondisjunction with aging of the ova.   We reviewed chromosomes, nondisjunction, and the associated 1 in 58 risk for fetal aneuploidy related to a maternal age of 41 y.o. at delivery. They were counseled that the risk for aneuploidy decreases as gestational age increases, accounting for those pregnancies which spontaneously abort.  We specifically discussed Down syndrome (trisomy 64), trisomies 2 and 21, and sex chromosome aneuploidies (47,XXX and 47,XXY) including the common features and prognoses of each.   We reviewed available screening options including noninvasive prenatal testing (NIPT) and detailed ultrasound. They understand that screening tests are used to modify a patient's a priori risk for aneuploidy, typically based on age.  This estimate provides a pregnancy specific risk assessment.  Specifically, we discussed that NIPT analyzes cell free fetal DNA found in the maternal circulation. This test is not diagnostic for chromosome conditions, but can provide information regarding the presence or absence of extra fetal DNA for chromosomes 13, 18, 21, X, and Y, and missing fetal DNA for chromosome X and Y (Turner syndrome). Thus, it would not identify or rule out all genetic conditions. The reported detection rate is greater than 99% for Trisomy 21, greater than 98% for Trisomy 18, and is approximately 80% (8 out of 10) for Trisomy 13. The false positive rate is reported to be  less than 0.1% for any of these conditions.  In addition, we discussed that ~50-80% of fetuses with Down syndrome and up to 90-95% of fetuses with trisomy 18/13, when well visualized, have detectable anomalies or soft markers by detailed ultrasound (~18+ weeks gestation).   They were also counseled regarding diagnostic testing via amniocentesis.  We reviewed the approximate 1 in 300-500 risk for complications for amniocentesis, including spontaneous preterm labor and delivery. We discussed the risks, limitations, and benefits of each screening and testing option. After consideration of all the options, they elected to proceed with ultrasound only and declined NIPT and amniocentesis.  A complete ultrasound was performed today.  The ultrasound report will be sent under separate cover.  She understands that ultrasound cannot rule out all birth defects or genetic syndromes.   Both family histories were reviewed and found to be contributory for cerebral palsy in the patient's son with a previous partner. He was reportedly born at [redacted] weeks gestation. He is currently 14 years and reportedly nonambulatory. This family history is not thought to increase the chance for cerebral palsy in the current pregnancy, assuming that her son's diagnosis is correct, and especially when a cause is highly suspected.   The father of the pregnancy reported a female maternal first cousin with "soft bones." He is currently 40 years old and in wheelchair. 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 Osteogenesis imperfecta (OI) is a group of genetic conditions characterized by skeletal abnormalities including bones that are easily fractured, bone deformities and short stature, connective  tissue abnormalities, dental problems, and possible hearing loss later in life.  Symptoms can range from very mild to severe. Both  autosomal recessive and autosomal dominant inheritance is described for OI. However, we discussed that his cousin's condition could be a different underlying etiology. Given the reported family history and no known consanguinity to the patient, we discussed that recurrence risk for the current pregnancy for the condition in the family is likely low. However, additional information regarding this family history may alter recurrence risk assessment.  Without further information regarding the provided family history, an accurate genetic risk cannot be calculated. Further genetic counseling is warranted if more information is obtained.  Ms. Beth Jacobs denied exposure to environmental toxins or chemical agents. She denied the use of alcohol, tobacco or street drugs. However, Ms. Beth Jacobs's medical records indicate a past urine drug screen positive for cocaine. Cocaine use has been shown to increase the chance for spontaneous abortion, placental abruption, growth delays and brain hemorrhage in the baby.  Birth defects involving the heart, skull, and genitourinary tract have also been reported. She denied significant viral illnesses during the course of her pregnancy. Her medical and surgical histories were contributory for previous preterm delivery.   I counseled this couple regarding the above risks and available options.  The approximate face-to-face time with the genetic counselor was 25 minutes.  Quinn Plowman, MS,  Certified Genetic Counselor 10/26/2012

## 2012-10-26 NOTE — Addendum Note (Signed)
Encounter addended by: Eulis Foster, MD on: 10/26/2012 11:23 AM<BR>     Documentation filed: Notes Section

## 2012-10-28 LAB — OB RESULTS CONSOLE GC/CHLAMYDIA: Chlamydia: NEGATIVE

## 2012-10-28 LAB — OB RESULTS CONSOLE HGB/HCT, BLOOD: Hemoglobin: 9.9 g/dL

## 2012-10-28 LAB — OB RESULTS CONSOLE ABO/RH: RH Type: POSITIVE

## 2012-10-28 LAB — OB RESULTS CONSOLE RPR: RPR: NONREACTIVE

## 2012-10-28 LAB — OB RESULTS CONSOLE HIV ANTIBODY (ROUTINE TESTING): HIV: NONREACTIVE

## 2012-11-10 ENCOUNTER — Ambulatory Visit (HOSPITAL_COMMUNITY): Payer: Medicaid Other | Attending: Obstetrics

## 2012-11-22 NOTE — L&D Delivery Note (Signed)
Delivery Note At 4:54 PM a viable female was delivered via Vaginal, Spontaneous Delivery (Presentation: Left Occiput Anterior).  APGAR: 8, 9; weight .   Placenta status: Intact, Spontaneous Manual removal.  Cord: 3 vessels with the following complications: Short.  Cord pH: not done  Anesthesia: Epidural  Episiotomy: None Lacerations:  Suture Repair: 2.0 Est. Blood Loss (mL):   Mom to postpartum.  Baby to nursery-stable.  MARSHALL,BERNARD A 11/26/2012, 5:09 PM

## 2012-11-26 ENCOUNTER — Encounter (HOSPITAL_COMMUNITY): Payer: Self-pay | Admitting: Anesthesiology

## 2012-11-26 ENCOUNTER — Encounter (HOSPITAL_COMMUNITY): Payer: Self-pay | Admitting: *Deleted

## 2012-11-26 ENCOUNTER — Inpatient Hospital Stay (HOSPITAL_COMMUNITY)
Admission: AD | Admit: 2012-11-26 | Discharge: 2012-11-28 | DRG: 774 | Disposition: A | Discharge status: Home / Self Care | Payer: Medicaid Other | Source: Ambulatory Visit | Attending: Obstetrics | Admitting: Obstetrics

## 2012-11-26 ENCOUNTER — Inpatient Hospital Stay (HOSPITAL_COMMUNITY): Payer: Medicaid Other | Admitting: Anesthesiology

## 2012-11-26 ENCOUNTER — Encounter (HOSPITAL_COMMUNITY): Payer: Self-pay

## 2012-11-26 DIAGNOSIS — O0930 Supervision of pregnancy with insufficient antenatal care, unspecified trimester: Secondary | ICD-10-CM

## 2012-11-26 DIAGNOSIS — O09529 Supervision of elderly multigravida, unspecified trimester: Secondary | ICD-10-CM | POA: Diagnosis present

## 2012-11-26 LAB — POCT FERN TEST: POCT Fern Test: POSITIVE

## 2012-11-26 LAB — CBC
MCH: 30.1 pg (ref 26.0–34.0)
Platelets: 262 10*3/uL (ref 150–400)
RBC: 3.56 MIL/uL — ABNORMAL LOW (ref 3.87–5.11)
WBC: 7 10*3/uL (ref 4.0–10.5)

## 2012-11-26 LAB — RPR: RPR Ser Ql: NONREACTIVE

## 2012-11-26 MED ORDER — OXYCODONE-ACETAMINOPHEN 5-325 MG PO TABS
1.0000 | ORAL_TABLET | ORAL | Status: DC | PRN
  Administered 2012-11-27 – 2012-11-28 (×5): 1 via ORAL
  Filled 2012-11-26 (×5): qty 1

## 2012-11-26 MED ORDER — DIPHENHYDRAMINE HCL 50 MG/ML IJ SOLN: 12.5000 mg | INTRAMUSCULAR | Status: DC | PRN

## 2012-11-26 MED ORDER — ACETAMINOPHEN 325 MG PO TABS: 650.0000 mg | ORAL_TABLET | ORAL | Status: DC | PRN

## 2012-11-26 MED ORDER — SENNOSIDES-DOCUSATE SODIUM 8.6-50 MG PO TABS
2.0000 | ORAL_TABLET | Freq: Every day | ORAL | Status: DC
  Administered 2012-11-26 – 2012-11-27 (×2): 2 via ORAL

## 2012-11-26 MED ORDER — ONDANSETRON HCL 4 MG/2ML IJ SOLN: 4.0000 mg | INTRAMUSCULAR | Status: DC | PRN

## 2012-11-26 MED ORDER — PRENATAL MULTIVITAMIN CH
1.0000 | ORAL_TABLET | Freq: Every day | ORAL | Status: DC
  Administered 2012-11-27: 1 via ORAL
  Filled 2012-11-26: qty 1

## 2012-11-26 MED ORDER — OXYTOCIN BOLUS FROM INFUSION: 500.0000 mL | INTRAVENOUS | Status: DC

## 2012-11-26 MED ORDER — IBUPROFEN 600 MG PO TABS
600.0000 mg | ORAL_TABLET | Freq: Four times a day (QID) | ORAL | Status: DC | PRN
  Filled 2012-11-26 (×3): qty 1

## 2012-11-26 MED ORDER — TERBUTALINE SULFATE 1 MG/ML IJ SOLN: 0.2500 mg | Freq: Once | INTRAMUSCULAR | Status: DC | PRN

## 2012-11-26 MED ORDER — SIMETHICONE 80 MG PO CHEW: 80.0000 mg | CHEWABLE_TABLET | ORAL | Status: DC | PRN

## 2012-11-26 MED ORDER — LACTATED RINGERS IV SOLN
INTRAVENOUS | Status: DC
  Administered 2012-11-26 (×2): via INTRAVENOUS

## 2012-11-26 MED ORDER — INFLUENZA VIRUS VACC SPLIT PF IM SUSP
0.5000 mL | INTRAMUSCULAR | Status: DC
  Filled 2012-11-26: qty 0.5

## 2012-11-26 MED ORDER — LIDOCAINE HCL (PF) 1 % IJ SOLN
30.0000 mL | INTRAMUSCULAR | Status: DC | PRN
  Filled 2012-11-26: qty 30

## 2012-11-26 MED ORDER — LACTATED RINGERS IV SOLN: 500.0000 mL | INTRAVENOUS | Status: DC | PRN

## 2012-11-26 MED ORDER — TETANUS-DIPHTH-ACELL PERTUSSIS 5-2.5-18.5 LF-MCG/0.5 IM SUSP
0.5000 mL | Freq: Once | INTRAMUSCULAR | Status: AC
  Administered 2012-11-27: 0.5 mL via INTRAMUSCULAR

## 2012-11-26 MED ORDER — ONDANSETRON HCL 4 MG PO TABS: 4.0000 mg | ORAL_TABLET | ORAL | Status: DC | PRN

## 2012-11-26 MED ORDER — FERROUS SULFATE 325 (65 FE) MG PO TABS
325.0000 mg | ORAL_TABLET | Freq: Two times a day (BID) | ORAL | Status: DC
  Administered 2012-11-27 – 2012-11-28 (×3): 325 mg via ORAL
  Filled 2012-11-26 (×3): qty 1

## 2012-11-26 MED ORDER — OXYTOCIN 40 UNITS IN LACTATED RINGERS INFUSION - SIMPLE MED
62.5000 mL/h | INTRAVENOUS | Status: DC
  Filled 2012-11-26: qty 1000

## 2012-11-26 MED ORDER — LIDOCAINE HCL (PF) 1 % IJ SOLN
INTRAMUSCULAR | Status: DC | PRN
  Administered 2012-11-26 (×2): 8 mL

## 2012-11-26 MED ORDER — ZOLPIDEM TARTRATE 5 MG PO TABS: 5.0000 mg | ORAL_TABLET | Freq: Every evening | ORAL | Status: DC | PRN

## 2012-11-26 MED ORDER — EPHEDRINE 5 MG/ML INJ
10.0000 mg | INTRAVENOUS | Status: DC | PRN
  Filled 2012-11-26: qty 4

## 2012-11-26 MED ORDER — WITCH HAZEL-GLYCERIN EX PADS: 1.0000 "application " | MEDICATED_PAD | CUTANEOUS | Status: DC | PRN

## 2012-11-26 MED ORDER — LANOLIN HYDROUS EX OINT: TOPICAL_OINTMENT | CUTANEOUS | Status: DC | PRN

## 2012-11-26 MED ORDER — CITRIC ACID-SODIUM CITRATE 334-500 MG/5ML PO SOLN: 30.0000 mL | ORAL | Status: DC | PRN

## 2012-11-26 MED ORDER — FENTANYL 2.5 MCG/ML BUPIVACAINE 1/10 % EPIDURAL INFUSION (WH - ANES)
14.0000 mL/h | INTRAMUSCULAR | Status: DC
  Administered 2012-11-26: 14 mL/h via EPIDURAL
  Filled 2012-11-26 (×2): qty 125

## 2012-11-26 MED ORDER — LACTATED RINGERS IV SOLN
500.0000 mL | Freq: Once | INTRAVENOUS | Status: AC
  Administered 2012-11-26: 500 mL via INTRAVENOUS

## 2012-11-26 MED ORDER — DIPHENHYDRAMINE HCL 25 MG PO CAPS: 25.0000 mg | ORAL_CAPSULE | Freq: Four times a day (QID) | ORAL | Status: DC | PRN

## 2012-11-26 MED ORDER — IBUPROFEN 600 MG PO TABS
600.0000 mg | ORAL_TABLET | Freq: Four times a day (QID) | ORAL | Status: DC
  Administered 2012-11-26 – 2012-11-28 (×6): 600 mg via ORAL
  Filled 2012-11-26 (×3): qty 1

## 2012-11-26 MED ORDER — PHENYLEPHRINE 40 MCG/ML (10ML) SYRINGE FOR IV PUSH (FOR BLOOD PRESSURE SUPPORT)
80.0000 ug | PREFILLED_SYRINGE | INTRAVENOUS | Status: DC | PRN
  Filled 2012-11-26: qty 5

## 2012-11-26 MED ORDER — ONDANSETRON HCL 4 MG/2ML IJ SOLN
4.0000 mg | Freq: Four times a day (QID) | INTRAMUSCULAR | Status: DC | PRN
  Administered 2012-11-26: 4 mg via INTRAVENOUS
  Filled 2012-11-26: qty 2

## 2012-11-26 MED ORDER — DIBUCAINE 1 % RE OINT: 1.0000 "application " | TOPICAL_OINTMENT | RECTAL | Status: DC | PRN

## 2012-11-26 MED ORDER — BUTORPHANOL TARTRATE 1 MG/ML IJ SOLN: 1.0000 mg | INTRAMUSCULAR | Status: DC | PRN

## 2012-11-26 MED ORDER — EPHEDRINE 5 MG/ML INJ: 10.0000 mg | INTRAVENOUS | Status: DC | PRN

## 2012-11-26 MED ORDER — FLEET ENEMA 7-19 GM/118ML RE ENEM: 1.0000 | ENEMA | RECTAL | Status: DC | PRN

## 2012-11-26 MED ORDER — OXYTOCIN 40 UNITS IN LACTATED RINGERS INFUSION - SIMPLE MED
1.0000 m[IU]/min | INTRAVENOUS | Status: DC
  Administered 2012-11-26: 1 m[IU]/min via INTRAVENOUS

## 2012-11-26 MED ORDER — BENZOCAINE-MENTHOL 20-0.5 % EX AERO: 1.0000 "application " | INHALATION_SPRAY | CUTANEOUS | Status: DC | PRN

## 2012-11-26 MED ORDER — PHENYLEPHRINE 40 MCG/ML (10ML) SYRINGE FOR IV PUSH (FOR BLOOD PRESSURE SUPPORT): 80.0000 ug | PREFILLED_SYRINGE | INTRAVENOUS | Status: DC | PRN

## 2012-11-26 MED ORDER — OXYCODONE-ACETAMINOPHEN 5-325 MG PO TABS
1.0000 | ORAL_TABLET | ORAL | Status: DC | PRN
Start: 2012-11-26 — End: 2012-11-28
  Administered 2012-11-27: 2 via ORAL
  Administered 2012-11-27: 1 via ORAL
  Filled 2012-11-26: qty 1
  Filled 2012-11-26: qty 2

## 2012-11-26 MED ORDER — FENTANYL 2.5 MCG/ML BUPIVACAINE 1/10 % EPIDURAL INFUSION (WH - ANES)
INTRAMUSCULAR | Status: DC | PRN
  Administered 2012-11-26: 14 mL/h via EPIDURAL

## 2012-11-26 NOTE — H&P (Signed)
This is Dr. Francoise Ceo dictating the history and physical on  [place] she's a 42 year old gravida 6 para 2123 at 39 weeks and 2 days EDC 12/01/2012 negative GBS her membranes ruptured at 4 AM today and on admission she was 3 cm she's no 4 cm vertex -2-3 station falters ruptured fluid clear IUPC inserted and she is on low-dose Pitocin if needs be Past medical history negative Past surgical history negative Social history negative System review negative Physical exam well-developed female in labor HEENT negative Breasts negative Heart regular rhythm no murmurs no gallops Abdomen term Pelvic as described above Extremities negative

## 2012-11-26 NOTE — Anesthesia Preprocedure Evaluation (Addendum)
Anesthesia Evaluation  Patient identified by MRN, date of birth, ID band Patient awake    Reviewed: Allergy & Precautions, H&P , NPO status , Patient's Chart, lab work & pertinent test results  Airway Mallampati: II TM Distance: >3 FB Neck ROM: full    Dental No notable dental hx.    Pulmonary neg pulmonary ROS,          Cardiovascular negative cardio ROS      Neuro/Psych negative neurological ROS  negative psych ROS   GI/Hepatic Neg liver ROS,   Endo/Other  negative endocrine ROS  Renal/GU negative Renal ROS  negative genitourinary   Musculoskeletal negative musculoskeletal ROS (+)   Abdominal (+) + obese,   Peds negative pediatric ROS (+)  Hematology negative hematology ROS (+)   Anesthesia Other Findings   Reproductive/Obstetrics (+) Pregnancy                           Anesthesia Physical Anesthesia Plan  ASA: II  Anesthesia Plan: Epidural   Post-op Pain Management:    Induction:   Airway Management Planned:   Additional Equipment:   Intra-op Plan:   Post-operative Plan:   Informed Consent: I have reviewed the patients History and Physical, chart, labs and discussed the procedure including the risks, benefits and alternatives for the proposed anesthesia with the patient or authorized representative who has indicated his/her understanding and acceptance.     Plan Discussed with:   Anesthesia Plan Comments:         Anesthesia Quick Evaluation

## 2012-11-26 NOTE — Anesthesia Procedure Notes (Signed)
Epidural Patient location during procedure: OB Start time: 11/26/2012 5:22 AM End time: 11/26/2012 5:28 AM  Staffing Anesthesiologist: Sandrea Hughs Performed by: anesthesiologist   Preanesthetic Checklist Completed: patient identified, site marked, surgical consent, pre-op evaluation, timeout performed, IV checked, risks and benefits discussed and monitors and equipment checked  Epidural Patient position: sitting Prep: site prepped and draped and DuraPrep Patient monitoring: continuous pulse ox and blood pressure Approach: midline Injection technique: LOR air  Needle:  Needle type: Tuohy  Needle gauge: 17 G Needle length: 9 cm and 9 Needle insertion depth: 5 cm cm Catheter type: closed end flexible Catheter size: 19 Gauge Catheter at skin depth: 10 cm Test dose: negative and Other  Assessment Sensory level: T9 Events: blood not aspirated, injection not painful, no injection resistance, negative IV test and no paresthesia  Additional Notes Reason for block:procedure for pain

## 2012-11-26 NOTE — MAU Note (Signed)
Contractions every 15-20 minutes x1 hour. Small gush of fluid leaked about 45 minutes ago, pinkish. Some leaking since then.

## 2012-11-27 LAB — CBC
HCT: 27.8 % — ABNORMAL LOW (ref 36.0–46.0)
MCV: 91.4 fL (ref 78.0–100.0)
Platelets: 227 10*3/uL (ref 150–400)
RBC: 3.04 MIL/uL — ABNORMAL LOW (ref 3.87–5.11)
WBC: 19.6 10*3/uL — ABNORMAL HIGH (ref 4.0–10.5)

## 2012-11-27 NOTE — Progress Notes (Signed)
Patient ID: Beth Jacobs, female   DOB: [birth year], 42 y.o.   MRN: [id number] Postpartum day one Vital signs normal Fundus firm Extended No complaints

## 2012-11-27 NOTE — Progress Notes (Signed)
Ur chart review completed.  

## 2012-11-27 NOTE — Clinical Social Work Maternal (Signed)
    Clinical Social Work Department PSYCHOSOCIAL ASSESSMENT - MATERNAL/CHILD 11/27/2012  Patient:  Beth Jacobs, Beth Jacobs  Account Number:  [id number]  Admit Date:  11/26/2012  Marjo Bicker Name:   Beth Jacobs    Clinical Social Worker:  Nobie Putnam, LCSW   Date/Time:  11/27/2012 01:35 PM  Date Referred:  11/27/2012   Referral source  CN     Referred reason  [facility]  Substance Abuse   Other referral source:    I:  FAMILY / HOME ENVIRONMENT Child's legal guardian:  PARENT  Guardian - Name Guardian - Age Guardian - Address  Beth Jacobs 41 [address] [place], [place] [address]  Beth Jacobs 50    Other household support members/support persons Name Relationship DOB  Beth Jacobs DAUGHTER 1991   SON 2000   SON 2002   Other support:    II  PSYCHOSOCIAL DATA Information Source:  Patient Interview  Surveyor, quantity and Community Resources Employment:   [employer]   Financial resources:  Medicaid If Medicaid - County:  [employer] Other  Chemical engineer / Grade:   Maternity Care Coordinator / Child Services Coordination / Early Interventions:  Cultural issues impacting care:    III  STRENGTHS Strengths  Adequate Resources  Home prepared for Child (including basic supplies)  Supportive family/friends   Strength comment:    IV  RISK FACTORS AND CURRENT PROBLEMS Current Problem:  YES   Risk Factor & Current Problem Patient Issue Family Issue Risk Factor / Current Problem Comment  Other - See comment Y N LPNC  Substance Abuse Y N Hx of MJ & Cocaine    V  SOCIAL WORK ASSESSMENT CSW met with pt to assess reason for [facility] & history of substance use.  Pt received pregnancy confirmation at 16 weeks.  According to the pt, she applied for Medicaid & had to wait for approval of benefits.  Once insurance benefits were received, she established [facility].  She admits to using cocaine, "once or twice a month," prior to pregnancy confirmation in May 2013.  She denies smoking MJ.  CSW explained  hospital drug testing policy and pt verbalized understanding.  UDS is negative, meconium results are pending.  Pt has all the necessary supplies for the infant and good family support.  CSW will continue to monitor drug screen results & make a referral if needed.      VI SOCIAL WORK PLAN Social Work Plan  No Further Intervention Required / No Barriers to Discharge   Type of pt/family education:   If child protective services report - county:   If child protective services report - date:   Information/referral to community resources comment:   Other social work plan:

## 2012-11-27 NOTE — Anesthesia Postprocedure Evaluation (Signed)
Anesthesia Post Note  Patient: Beth Jacobs  Procedure(s) Performed: * No procedures listed *  Anesthesia type: Epidural  Patient location: Mother/Baby  Post pain: Pain level controlled  Post assessment: Post-op Vital signs reviewed  Last Vitals:  Filed Vitals:   11/27/12 0400  BP: 120/70  Pulse: 91  Temp: 36.6 C  Resp: 20    Post vital signs: Reviewed  Level of consciousness:alert  Complications: No apparent anesthesia complications

## 2012-11-28 LAB — TYPE AND SCREEN
ABO/RH(D): O POS
Antibody Screen: NEGATIVE
Unit division: 0

## 2012-11-28 NOTE — Discharge Summary (Signed)
Obstetric Discharge Summary Reason for Admission: induction of labor Prenatal Procedures: none Intrapartum Procedures: spontaneous vaginal delivery Postpartum Procedures: none Complications-Operative and Postpartum: none Hemoglobin  Date Value Range Status  11/27/2012 9.2* 12.0 - 15.0 g/dL Final  16/11/958 9.9   Final     HCT  Date Value Range Status  11/27/2012 27.8* 36.0 - 46.0 % Final    Physical Exam:  General: alert Lochia: appropriate Uterine Fundus: firm Incision: healing well DVT Evaluation: No evidence of DVT seen on physical exam.  Discharge Diagnoses: Term Pregnancy-delivered  Discharge Information: Date: 11/28/2012 Activity: pelvic rest Diet: routine Medications: Percocet Condition: stable Instructions: refer to practice specific booklet Discharge to: home Follow-up Information    In 6 weeks to follow up.         Newborn Data: Live born female  Birth Weight: 7 lb 15.2 oz (3606 g) APGAR: 8, 9  Home with motherand and.  MARSHALL,BERNARD A 11/28/2012, 7:56 AMand

## 2012-11-29 ENCOUNTER — Encounter: Payer: Self-pay | Admitting: Obstetrics and Gynecology

## 2012-11-29 ENCOUNTER — Encounter: Payer: Medicaid Other | Admitting: Advanced Practice Midwife

## 2012-12-27 ENCOUNTER — Ambulatory Visit: Payer: Medicaid Other | Admitting: Obstetrics and Gynecology

## 2013-01-20 ENCOUNTER — Emergency Department (HOSPITAL_COMMUNITY)
Admission: EM | Admit: 2013-01-20 | Discharge: 2013-01-20 | Disposition: A | Discharge status: Home / Self Care | Payer: Medicaid Other | Attending: Emergency Medicine | Admitting: Emergency Medicine

## 2013-01-20 ENCOUNTER — Emergency Department (HOSPITAL_COMMUNITY): Payer: Medicaid Other

## 2013-01-20 DIAGNOSIS — Z8751 Personal history of pre-term labor: Secondary | ICD-10-CM | POA: Insufficient documentation

## 2013-01-20 DIAGNOSIS — Z3202 Encounter for pregnancy test, result negative: Secondary | ICD-10-CM | POA: Insufficient documentation

## 2013-01-20 DIAGNOSIS — Z79899 Other long term (current) drug therapy: Secondary | ICD-10-CM | POA: Insufficient documentation

## 2013-01-20 DIAGNOSIS — R079 Chest pain, unspecified: Secondary | ICD-10-CM

## 2013-01-20 LAB — URINALYSIS, ROUTINE W REFLEX MICROSCOPIC
Bilirubin Urine: NEGATIVE
Glucose, UA: NEGATIVE mg/dL
Hgb urine dipstick: NEGATIVE
Ketones, ur: NEGATIVE mg/dL
Nitrite: NEGATIVE
Protein, ur: NEGATIVE mg/dL
Specific Gravity, Urine: 1.017 (ref 1.005–1.030)
Urobilinogen, UA: 1 mg/dL (ref 0.0–1.0)
pH: 5.5 (ref 5.0–8.0)

## 2013-01-20 LAB — URINE MICROSCOPIC-ADD ON

## 2013-01-20 LAB — POCT I-STAT, CHEM 8
BUN: 16 mg/dL (ref 6–23)
Calcium, Ion: 1.18 mmol/L (ref 1.12–1.23)
Chloride: 104 mEq/L (ref 96–112)
Creatinine, Ser: 1.3 mg/dL — ABNORMAL HIGH (ref 0.50–1.10)
Glucose, Bld: 92 mg/dL (ref 70–99)
HCT: 40 % (ref 36.0–46.0)
Hemoglobin: 13.6 g/dL (ref 12.0–15.0)
Potassium: 3.5 mEq/L (ref 3.5–5.1)
Sodium: 141 mEq/L (ref 135–145)
TCO2: 30 mmol/L (ref 0–100)

## 2013-01-20 LAB — PREGNANCY, URINE: Preg Test, Ur: NEGATIVE

## 2013-01-20 MED ORDER — IBUPROFEN 200 MG PO TABS
600.0000 mg | ORAL_TABLET | Freq: Once | ORAL | Status: AC
  Administered 2013-01-20: 600 mg via ORAL
  Filled 2013-01-20: qty 3

## 2013-01-20 MED ORDER — NAPROXEN 375 MG PO TABS: 375.0000 mg | ORAL_TABLET | Freq: Two times a day (BID) | ORAL | Status: DC | PRN

## 2013-01-20 MED ORDER — OXYCODONE-ACETAMINOPHEN 5-325 MG PO TABS
2.0000 | ORAL_TABLET | Freq: Once | ORAL | Status: AC
  Administered 2013-01-20: 2 via ORAL
  Filled 2013-01-20: qty 2

## 2013-01-20 MED ORDER — DIAZEPAM 5 MG PO TABS
5.0000 mg | ORAL_TABLET | Freq: Once | ORAL | Status: AC
  Administered 2013-01-20: 5 mg via ORAL
  Filled 2013-01-20: qty 1

## 2013-01-20 NOTE — ED Notes (Signed)
She c/o left thoracic/ribs area pain x 2-3 days. She states she had a baby at [facility] Jan. 5th of this year "and has been real tired".  She denies any dysuria/hematuria.  She denies fever/cough.  ECG performed at triage.

## 2013-01-21 LAB — URINE CULTURE: Colony Count: 65000

## 2013-01-23 NOTE — ED Provider Notes (Signed)
History    42 year old female with multiple complaints. Patient states that she feels very fatigued. Denies any focal deficits. Achy pain in her left chest. Gradual onset about 10 days ago. Denies trauma. Pain has been constant since he first noticed it. Does not radiate. No cough. No fevers or chills. Denies any shortness of breath. No nausea, palpitations or diaphoresis. No unusual leg pain or swelling. Denies any history of blood clot.  CSN: [id number]  Arrival date & time 01/20/13  1221   First MD Initiated Contact with Patient 01/20/13 1243      Chief Complaint  Patient presents with  . Flank Pain    (Consider location/radiation/quality/duration/timing/severity/associated sxs/prior treatment) HPI  Past Medical History  Diagnosis Date  . Preterm labor     Past Surgical History  Procedure Laterality Date  . No past surgeries      Family History  Problem Relation Age of Onset  . Other Neg Hx   . Cerebral palsy Son     born at [redacted] weeks gestation    History  Substance Use Topics  . Smoking status: Never Smoker   . Smokeless tobacco: Not on file  . Alcohol Use: No    OB History   Grav Para Term Preterm Abortions TAB SAB Ect Mult Living   6 4 3 1 2 1 1   4       Review of Systems  All systems reviewed and negative, other than as noted in HPI.   Allergies  Bee venom; Latex; and Tomato  Home Medications   Current Outpatient Rx  Name  Route  Sig  Dispense  Refill  . ferrous fumarate (HEMOCYTE - 106 MG FE) 325 (106 FE) MG TABS   Oral   Take 1 tablet by mouth daily.         . Prenatal Vit-Fe Fumarate-FA (MULTIVITAMIN-PRENATAL) 27-0.8 MG TABS   Oral   Take 1 tablet by mouth daily at 12 noon.         . naproxen (NAPROSYN) 375 MG tablet   Oral   Take 1 tablet (375 mg total) by mouth 2 (two) times daily as needed.   20 tablet   0     BP 106/66  Pulse 64  Temp(Src) 98 F (36.7 C) (Oral)  Resp 16  SpO2 97%  LMP 01/10/2013  Breastfeeding?  No  Physical Exam  Nursing note and vitals reviewed. Constitutional: She appears well-developed and well-nourished. No distress.  Laying in bed. No acute distress. Obese.  HENT:  Head: Normocephalic and atraumatic.  Eyes: Conjunctivae are normal. Right eye exhibits no discharge. Left eye exhibits no discharge.  Neck: Neck supple.  Cardiovascular: Normal rate, regular rhythm and normal heart sounds.  Exam reveals no gallop and no friction rub.   No murmur heard. Pulmonary/Chest: Effort normal and breath sounds normal. No respiratory distress.  Abdominal: Soft. She exhibits no distension. There is no tenderness.  Musculoskeletal: She exhibits no edema and no tenderness.  Lower extremities symmetric as compared to each other. No calf tenderness. Negative Homan's. No palpable cords.   Neurological: She is alert.  Skin: Skin is warm and dry.  Psychiatric: She has a normal mood and affect. Her behavior is normal. Thought content normal.    ED Course  Procedures (including critical care time)  Labs Reviewed  URINALYSIS, ROUTINE W REFLEX MICROSCOPIC - Abnormal; Notable for the following:    APPearance CLOUDY (*)    Leukocytes, UA LARGE (*)    All other  components within normal limits  URINE MICROSCOPIC-ADD ON - Abnormal; Notable for the following:    Squamous Epithelial / LPF MANY (*)    Bacteria, UA MANY (*)    All other components within normal limits  POCT I-STAT, CHEM 8 - Abnormal; Notable for the following:    Creatinine, Ser 1.30 (*)    All other components within normal limits  URINE CULTURE  PREGNANCY, URINE   No results found.  EKG:  Rhythm: Normal sinus rhythm Vent. rate 84 BPM PR interval 140 ms QRS duration 88 ms QT/QTc 380/449 ms ST segments: Normal  1. Chest pain       MDM  56-year-old female with multiple complaints. Very low suspicion for emergent etiology of any of them. Her chest pain is atypical for ACS. Doubt infectious, dissection or pulmonary  embolism. Her chest x-ray is clear chest & no increased work of breathing. Normal-appearing EKG. Plan symptomatic treatment at this time. Emergent return precautions were discussed        Raeford Razor, MD 01/23/13 1932

## 2013-02-23 ENCOUNTER — Emergency Department (HOSPITAL_COMMUNITY)
Admission: EM | Admit: 2013-02-23 | Discharge: 2013-02-24 | Disposition: A | Discharge status: Home / Self Care | Payer: Medicaid Other | Source: Home / Self Care

## 2013-02-23 ENCOUNTER — Encounter (HOSPITAL_COMMUNITY): Payer: Self-pay | Admitting: *Deleted

## 2013-02-23 DIAGNOSIS — K130 Diseases of lips: Secondary | ICD-10-CM

## 2013-02-23 MED ORDER — OXYCODONE-ACETAMINOPHEN 5-325 MG PO TABS
1.0000 | ORAL_TABLET | Freq: Once | ORAL | Status: AC
  Administered 2013-02-23: 1 via ORAL
  Filled 2013-02-23: qty 1

## 2013-02-23 MED ORDER — ONDANSETRON 4 MG PO TBDP
8.0000 mg | ORAL_TABLET | Freq: Once | ORAL | Status: AC
  Administered 2013-02-23: 8 mg via ORAL

## 2013-02-23 MED ORDER — ONDANSETRON 4 MG PO TBDP
ORAL_TABLET | ORAL | Status: AC
  Filled 2013-02-23: qty 2

## 2013-02-23 NOTE — ED Notes (Signed)
Pt c/o upper lip swelling x 1.5 weeks, states it started with a "pimple" but has kept swelling since.  No SOB, airway intact.  States the pain makes her nauseous.

## 2013-02-24 MED ORDER — OXYCODONE-ACETAMINOPHEN 5-325 MG PO TABS: 1.0000 | ORAL_TABLET | ORAL | Status: DC | PRN

## 2013-02-24 MED ORDER — FAMOTIDINE IN NACL 20-0.9 MG/50ML-% IV SOLN: 20.0000 mg | Freq: Once | INTRAVENOUS | Status: DC

## 2013-02-24 MED ORDER — METHYLPREDNISOLONE SODIUM SUCC 125 MG IJ SOLR: 125.0000 mg | Freq: Once | INTRAMUSCULAR | Status: DC

## 2013-02-24 MED ORDER — OXYCODONE-ACETAMINOPHEN 5-325 MG PO TABS
1.0000 | ORAL_TABLET | Freq: Once | ORAL | Status: AC
  Administered 2013-02-24: 1 via ORAL
  Filled 2013-02-24: qty 1

## 2013-02-24 MED ORDER — IBUPROFEN 400 MG PO TABS
800.0000 mg | ORAL_TABLET | Freq: Once | ORAL | Status: AC
  Administered 2013-02-24: 800 mg via ORAL
  Filled 2013-02-24: qty 2

## 2013-02-24 MED ORDER — CLINDAMYCIN HCL 150 MG PO CAPS: 300.0000 mg | ORAL_CAPSULE | Freq: Three times a day (TID) | ORAL | Status: DC

## 2013-02-24 MED ORDER — DIPHENHYDRAMINE HCL 50 MG/ML IJ SOLN: 25.0000 mg | Freq: Once | INTRAMUSCULAR | Status: DC

## 2013-02-24 NOTE — ED Notes (Signed)
PA at bedside to numb pt.

## 2013-02-25 ENCOUNTER — Encounter (HOSPITAL_COMMUNITY): Payer: Self-pay | Admitting: *Deleted

## 2013-02-25 ENCOUNTER — Emergency Department (HOSPITAL_COMMUNITY): Payer: Medicaid Other

## 2013-02-25 ENCOUNTER — Inpatient Hospital Stay (HOSPITAL_COMMUNITY)
Admission: EM | Admit: 2013-02-25 | Discharge: 2013-02-27 | DRG: 603 | Disposition: A | Discharge status: Home / Self Care | Payer: Medicaid Other | Attending: Internal Medicine | Admitting: Internal Medicine

## 2013-02-25 DIAGNOSIS — R609 Edema, unspecified: Secondary | ICD-10-CM

## 2013-02-25 DIAGNOSIS — Z79899 Other long term (current) drug therapy: Secondary | ICD-10-CM

## 2013-02-25 DIAGNOSIS — L0201 Cutaneous abscess of face: Principal | ICD-10-CM | POA: Diagnosis present

## 2013-02-25 DIAGNOSIS — Z6829 Body mass index (BMI) 29.0-29.9, adult: Secondary | ICD-10-CM

## 2013-02-25 DIAGNOSIS — Z91040 Latex allergy status: Secondary | ICD-10-CM

## 2013-02-25 DIAGNOSIS — Z792 Long term (current) use of antibiotics: Secondary | ICD-10-CM

## 2013-02-25 DIAGNOSIS — L03211 Cellulitis of face: Principal | ICD-10-CM | POA: Diagnosis present

## 2013-02-25 DIAGNOSIS — Z889 Allergy status to unspecified drugs, medicaments and biological substances status: Secondary | ICD-10-CM | POA: Diagnosis present

## 2013-02-25 DIAGNOSIS — Z91018 Allergy to other foods: Secondary | ICD-10-CM

## 2013-02-25 DIAGNOSIS — F1910 Other psychoactive substance abuse, uncomplicated: Secondary | ICD-10-CM

## 2013-02-25 DIAGNOSIS — D649 Anemia, unspecified: Secondary | ICD-10-CM | POA: Diagnosis present

## 2013-02-25 HISTORY — DX: Allergy status to unspecified drugs, medicaments and biological substances status: Z88.9

## 2013-02-25 LAB — CBC WITH DIFFERENTIAL/PLATELET
Eosinophils Relative: 1 % (ref 0–5)
HCT: 36.4 % (ref 36.0–46.0)
Hemoglobin: 11.9 g/dL — ABNORMAL LOW (ref 12.0–15.0)
Lymphocytes Relative: 18 % (ref 12–46)
Lymphs Abs: 2.2 10*3/uL (ref 0.7–4.0)
MCV: 90.1 fL (ref 78.0–100.0)
Monocytes Absolute: 0.9 10*3/uL (ref 0.1–1.0)
Monocytes Relative: 8 % (ref 3–12)
Neutro Abs: 8.7 10*3/uL — ABNORMAL HIGH (ref 1.7–7.7)
WBC: 11.9 10*3/uL — ABNORMAL HIGH (ref 4.0–10.5)

## 2013-02-25 LAB — COMPREHENSIVE METABOLIC PANEL
AST: 15 U/L (ref 0–37)
BUN: 9 mg/dL (ref 6–23)
CO2: 25 mEq/L (ref 19–32)
Chloride: 102 mEq/L (ref 96–112)
Creatinine, Ser: 0.96 mg/dL (ref 0.50–1.10)
GFR calc Af Amer: 85 mL/min — ABNORMAL LOW (ref >90)
GFR calc non Af Amer: 73 mL/min — ABNORMAL LOW (ref >90)
Glucose, Bld: 97 mg/dL (ref 70–99)
Total Bilirubin: 0.2 mg/dL — ABNORMAL LOW (ref 0.3–1.2)

## 2013-02-25 LAB — HCG, SERUM, QUALITATIVE: Preg, Serum: NEGATIVE

## 2013-02-25 MED ORDER — IBUPROFEN 600 MG PO TABS
600.0000 mg | ORAL_TABLET | Freq: Three times a day (TID) | ORAL | Status: DC
  Administered 2013-02-25 – 2013-02-26 (×3): 600 mg via ORAL
  Filled 2013-02-25 (×8): qty 1

## 2013-02-25 MED ORDER — ONDANSETRON HCL 4 MG/2ML IJ SOLN
4.0000 mg | Freq: Once | INTRAMUSCULAR | Status: AC
  Administered 2013-02-25: 4 mg via INTRAVENOUS
  Filled 2013-02-25: qty 2

## 2013-02-25 MED ORDER — ONDANSETRON HCL 4 MG PO TABS: 4.0000 mg | ORAL_TABLET | Freq: Four times a day (QID) | ORAL | Status: DC | PRN

## 2013-02-25 MED ORDER — VANCOMYCIN HCL IN DEXTROSE 750-5 MG/150ML-% IV SOLN
750.0000 mg | Freq: Two times a day (BID) | INTRAVENOUS | Status: DC
  Administered 2013-02-25 – 2013-02-26 (×2): 750 mg via INTRAVENOUS
  Filled 2013-02-25 (×3): qty 150

## 2013-02-25 MED ORDER — DIPHENHYDRAMINE HCL 25 MG PO CAPS
25.0000 mg | ORAL_CAPSULE | Freq: Once | ORAL | Status: AC
  Administered 2013-02-25: 25 mg via ORAL
  Filled 2013-02-25: qty 1

## 2013-02-25 MED ORDER — MORPHINE SULFATE 4 MG/ML IJ SOLN
4.0000 mg | Freq: Once | INTRAMUSCULAR | Status: AC
  Administered 2013-02-25: 4 mg via INTRAVENOUS
  Filled 2013-02-25: qty 1

## 2013-02-25 MED ORDER — ENOXAPARIN SODIUM 40 MG/0.4ML ~~LOC~~ SOLN
40.0000 mg | SUBCUTANEOUS | Status: DC
  Administered 2013-02-25 – 2013-02-26 (×2): 40 mg via SUBCUTANEOUS
  Filled 2013-02-25 (×3): qty 0.4

## 2013-02-25 MED ORDER — ONDANSETRON HCL 4 MG/2ML IJ SOLN: 4.0000 mg | Freq: Four times a day (QID) | INTRAMUSCULAR | Status: DC | PRN

## 2013-02-25 MED ORDER — HYDROMORPHONE HCL PF 1 MG/ML IJ SOLN
1.0000 mg | INTRAMUSCULAR | Status: DC | PRN
  Administered 2013-02-25 – 2013-02-27 (×11): 1 mg via INTRAVENOUS
  Filled 2013-02-25 (×11): qty 1

## 2013-02-25 MED ORDER — ALBUTEROL SULFATE (5 MG/ML) 0.5% IN NEBU: 2.5000 mg | INHALATION_SOLUTION | RESPIRATORY_TRACT | Status: DC | PRN

## 2013-02-25 MED ORDER — ACETAMINOPHEN 650 MG RE SUPP: 650.0000 mg | Freq: Four times a day (QID) | RECTAL | Status: DC | PRN

## 2013-02-25 MED ORDER — SODIUM CHLORIDE 0.9 % IV BOLUS (SEPSIS)
1000.0000 mL | Freq: Once | INTRAVENOUS | Status: AC
  Administered 2013-02-25: 1000 mL via INTRAVENOUS

## 2013-02-25 MED ORDER — DIPHENHYDRAMINE HCL 25 MG PO CAPS
25.0000 mg | ORAL_CAPSULE | Freq: Four times a day (QID) | ORAL | Status: DC | PRN
  Administered 2013-02-25 – 2013-02-27 (×6): 25 mg via ORAL
  Filled 2013-02-25 (×6): qty 1

## 2013-02-25 MED ORDER — SODIUM CHLORIDE 0.9 % IV SOLN
INTRAVENOUS | Status: DC
  Administered 2013-02-25: 1000 mL via INTRAVENOUS

## 2013-02-25 MED ORDER — IOHEXOL 300 MG/ML  SOLN
100.0000 mL | Freq: Once | INTRAMUSCULAR | Status: AC | PRN
  Administered 2013-02-25: 100 mL via INTRAVENOUS

## 2013-02-25 MED ORDER — VANCOMYCIN HCL IN DEXTROSE 1-5 GM/200ML-% IV SOLN
1000.0000 mg | Freq: Once | INTRAVENOUS | Status: AC
  Administered 2013-02-25: 1000 mg via INTRAVENOUS
  Filled 2013-02-25: qty 200

## 2013-02-25 MED ORDER — HYDROCODONE-ACETAMINOPHEN 5-325 MG PO TABS
1.0000 | ORAL_TABLET | ORAL | Status: DC | PRN
  Administered 2013-02-25 – 2013-02-27 (×9): 2 via ORAL
  Filled 2013-02-25 (×9): qty 2

## 2013-02-25 MED ORDER — ACETAMINOPHEN 325 MG PO TABS: 650.0000 mg | ORAL_TABLET | Freq: Four times a day (QID) | ORAL | Status: DC | PRN

## 2013-02-25 NOTE — ED Notes (Signed)
Top lip is swollen,  Denies difficulty breathing,  States lip started swelling a week ago,

## 2013-02-25 NOTE — ED Notes (Signed)
Patient transported to CT 

## 2013-02-25 NOTE — ED Provider Notes (Signed)
8:10 AM patient complains of painful lip and swelling a week ago has been treated with clindamycin, will vomited this morning. Patient alert nontoxic handling secretions well upper lip markedly swollen, red and and tender. 9:20 PM pain improved after treatment with intervenous morphine. Her request more pain medicine additional morphine ordered patient alert awake. Nontoxic-appearing Case discussed with Dr.Hongalgi. plan admit to medical floor for IV antibiotics pain control. Results for orders placed during the hospital encounter of 02/25/13  CBC WITH DIFFERENTIAL      Result Value Range   WBC 11.9 (*) 4.0 - 10.5 K/uL   RBC 4.04  3.87 - 5.11 MIL/uL   Hemoglobin 11.9 (*) 12.0 - 15.0 g/dL   HCT 04.5  40.9 - 81.1 %   MCV 90.1  78.0 - 100.0 fL   MCH 29.5  26.0 - 34.0 pg   MCHC 32.7  30.0 - 36.0 g/dL   RDW 91.4  78.2 - 95.6 %   Platelets 421 (*) 150 - 400 K/uL   Neutrophils Relative 73  43 - 77 %   Neutro Abs 8.7 (*) 1.7 - 7.7 K/uL   Lymphocytes Relative 18  12 - 46 %   Lymphs Abs 2.2  0.7 - 4.0 K/uL   Monocytes Relative 8  3 - 12 %   Monocytes Absolute 0.9  0.1 - 1.0 K/uL   Eosinophils Relative 1  0 - 5 %   Eosinophils Absolute 0.1  0.0 - 0.7 K/uL   Basophils Relative 0  0 - 1 %   Basophils Absolute 0.0  0.0 - 0.1 K/uL  COMPREHENSIVE METABOLIC PANEL      Result Value Range   Sodium 134 (*) 135 - 145 mEq/L   Potassium 4.1  3.5 - 5.1 mEq/L   Chloride 102  96 - 112 mEq/L   CO2 25  19 - 32 mEq/L   Glucose, Bld 97  70 - 99 mg/dL   BUN 9  6 - 23 mg/dL   Creatinine, Ser 2.13  0.50 - 1.10 mg/dL   Calcium 9.0  8.4 - 08.6 mg/dL   Total Protein 7.6  6.0 - 8.3 g/dL   Albumin 3.3 (*) 3.5 - 5.2 g/dL   AST 15  0 - 37 U/L   ALT 16  0 - 35 U/L   Alkaline Phosphatase 86  39 - 117 U/L   Total Bilirubin 0.2 (*) 0.3 - 1.2 mg/dL   GFR calc non Af Amer 73 (*) >90 mL/min   GFR calc Af Amer 85 (*) >90 mL/min  HCG, SERUM, QUALITATIVE      Result Value Range   Preg, Serum NEGATIVE  NEGATIVE   Ct  Maxillofacial W/cm  02/25/2013  *RADIOLOGY REPORT*  Clinical Data: Facial swelling for 1 week.  CT MAXILLOFACIAL WITH CONTRAST  Technique:  Multidetector CT imaging of the maxillofacial structures was performed with intravenous contrast. Multiplanar CT image reconstructions were also generated.  Contrast:  100 ml Omnipaque 300  Comparison: CT head cervical spine 05/22/2006.  Findings: Soft tissue infiltration and enhancement in the submental fat and anterior to the mandible and upper lip.  The appearance suggests an inflammatory process such as cellulitis.  Suggestion of small focal subcutaneous emphysema.  No focal fluid collection to suggest an abscess.  Large dental caries involving the left upper molars, presumably representing first and second molars with previous resection of the third molars.  Minimal peri apical lucency around the second molar may represent.  Donnatal disease but no specific  bony abscesses identified.  The paranasal sinuses are clear.  There are displaced fracture deformities of the nasal bones which could represent acute or chronic process.  No overlying soft tissue swelling.  The orbital and facial bones appear otherwise intact.  IMPRESSION: Soft tissue swelling and diffuse enhancement of the soft tissues in the submental region in the upper lip consistent with cellulitis or inflammatory process.  No discrete abscess.  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  Age indeterminate nasal bone fractures.   Original Report Authenticated By: Burman Nieves, M.D.      Doug Sou, MD 02/25/13 [phone]

## 2013-02-25 NOTE — ED Notes (Signed)
Hospitalist called to inform me that he would be coming down to see pt and to hold taking pt upstairs at the moment. Will move pt upstairs when Dr is done.

## 2013-02-25 NOTE — ED Notes (Signed)
Patient reports one week ago she accidentally ate something that contained tomatoes. Patient states that she had some minor swelling and itching that has progressed. Patient states she came in this morning due to uncontrollable pain. Patient states she used hot and cold compresses at home and ibuprofen, patient did not take benadryl at any time. Patient sleepy, difficult to obtain information from. Patient positioned for comfort.

## 2013-02-25 NOTE — H&P (Signed)
[facility] History and Physical  Beth Jacobs HYQ:[id number] DOB: [birth year] DOA: 02/25/2013  Referring physician: Dr. Doug Sou, EDP PCP: No PCP Per Patient  Specialists:  None  Chief Complaint: Painful swelling of upper lip  HPI: Beth C Kief is a 42 y.o. female with history of anemia, presented to ED on 02/25/13 with complaints of upper lip pain, redness and swelling. She was in her usual state of health until 5 days ago. 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 She took Benadryl and her symptoms subsided. However over the next 2 days, she noticed slightly worsened swelling and pain of the upper lip which was not getting better. She was seen in the ED on 4/5 and the needle was inserted into upper lip in an attempt to drain pus but there was none. She was discharged on antibiotics. She now presented with worsening pain, redness and swelling of upper lip but denies any difficulty breathing or swallowing or tongue swelling. There is mild itching of her lips. She had 2 episodes of vomiting yesterday but has since tolerated diet. She denies fevers but had some chills. In the ED, CT scan is suggestive of upper lip cellulitis without obvious abscess, hemoglobin 11.9, Gappa blood cell 11.9 and serum pregnancy test negative. She has received a dose of vancomycin IV and the hospitalist service is requested to admit for further management.   Review of Systems: All systems reviewed and apart from history of presenting illness, are negative   Past Medical History  Diagnosis Date  . Preterm labor    Past Surgical History  Procedure Laterality Date  . No past surgeries     Social History:  reports that she has never smoked. She does not have any smokeless tobacco history on file. She reports that she does not drink alcohol or use illicit drugs. Patient is  independent of activities of daily living.  Allergies  Allergen Reactions  . Bee Venom Swelling    "Affects oxygen level"  . Latex Itching    & burning  . Tomato Swelling    Family History  Problem Relation Age of Onset  . Other Neg Hx   . Cerebral palsy Son     born at [redacted] weeks gestation    Prior to Admission medications   Medication Sig Start Date End Date Taking? Authorizing Provider  clindamycin (CLEOCIN) 150 MG capsule Take 2 capsules (300 mg total) by mouth 3 (three) times daily. 02/24/13  Yes Catherine E Schinlever, PA-C  ferrous fumarate (HEMOCYTE - 106 MG FE) 325 (106 FE) MG TABS Take 1 tablet by mouth every morning.    Yes Historical Provider, MD  naproxen (NAPROSYN) 375 MG tablet Take 375 mg by mouth 2 (two) times daily as needed (pain). 01/20/13  Yes Raeford Razor, MD  oxyCODONE-acetaminophen (PERCOCET/ROXICET) 5-325 MG per tablet Take 1 tablet by mouth every 4 (four) hours as needed for pain. 02/24/13  Yes Catherine E Schinlever, PA-C  Prenatal Vit-Fe Fumarate-FA (MULTIVITAMIN-PRENATAL) 27-0.8 MG TABS Take 1 tablet by mouth every morning.    Yes Historical Provider, MD   Physical Exam: Filed Vitals:   02/25/13 0524 02/25/13 0931 02/25/13 1100  BP: 132/73 127/57 115/76  Pulse: 103 81 92  Temp: 98.2 F (36.8 C) 98.4 F (36.9 C) 98.7 F (37.1 C)  TempSrc: Oral Oral Oral  Resp: 16 18 18  Height:   5\' 4"  (1.626 m)  Weight:   78.926 kg (174 lb)  SpO2: 99% 100% 98%     General exam: Moderately built and overweight female patient, lying comfortably supine on the gurney in no obvious distress.  Head, eyes and ENT: Nontraumatic and normocephalic. Pupils equally reacting to light and accommodation. Oral mucosa moist. Upper lip is significantly swollen, more so on the left side, mildly erythematous and tender. There is a ruptured blister on top of the swelling. No fluctuance. Mild erythema on the left the orbital region. Minimally swollen lower lip without any other acute  findings. Able to open mouth well.  Neck: Supple. No JVD, carotid bruit or thyromegaly.  Lymphatics: No lymphadenopathy.  Respiratory system: Clear to auscultation. No increased work of breathing.  Cardiovascular system: S1 and S2 heard, RRR. No JVD, murmurs, gallops, clicks or pedal edema.  Gastrointestinal system: Abdomen is nondistended, soft and nontender. Normal bowel sounds heard. No organomegaly or masses appreciated.  Central nervous system: Alert and oriented. No focal neurological deficits.  Extremities: Symmetric 5 x 5 power. Peripheral pulses symmetrically felt.  Skin: No rashes or acute findings.  Musculoskeletal system: Negative exam.  Psychiatry: Pleasant and cooperative.   Labs on Admission:  Basic Metabolic Panel:  Recent Labs Lab 02/25/13 0655  NA 134*  K 4.1  CL 102  CO2 25  GLUCOSE 97  BUN 9  CREATININE 0.96  CALCIUM 9.0   Liver Function Tests:  Recent Labs Lab 02/25/13 0655  AST 15  ALT 16  ALKPHOS 86  BILITOT 0.2*  PROT 7.6  ALBUMIN 3.3*   No results found for this basename: LIPASE, AMYLASE,  in the last 168 hours No results found for this basename: AMMONIA,  in the last 168 hours CBC:  Recent Labs Lab 02/25/13 0655  WBC 11.9*  NEUTROABS 8.7*  HGB 11.9*  HCT 36.4  MCV 90.1  PLT 421*   Cardiac Enzymes: No results found for this basename: CKTOTAL, CKMB, CKMBINDEX, TROPONINI,  in the last 168 hours  BNP (last 3 results) No results found for this basename: PROBNP,  in the last 8760 hours CBG: No results found for this basename: GLUCAP,  in the last 168 hours  Radiological Exams on Admission: Ct Maxillofacial W/cm  02/25/2013  *RADIOLOGY REPORT*  Clinical Data: Facial swelling for 1 week.  CT MAXILLOFACIAL WITH CONTRAST  Technique:  Multidetector CT imaging of the maxillofacial structures was performed with intravenous contrast. Multiplanar CT image reconstructions were also generated.  Contrast:  100 ml Omnipaque 300   Comparison: CT head cervical spine 05/22/2006.  Findings: Soft tissue infiltration and enhancement in the submental fat and anterior to the mandible and upper lip.  The appearance suggests an inflammatory process such as cellulitis.  Suggestion of small focal subcutaneous emphysema.  No focal fluid collection to suggest an abscess.  Large dental caries involving the left upper molars, presumably representing first and second molars with previous resection of the third molars.  Minimal peri apical lucency around the second molar may represent.  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  The paranasal sinuses are clear.  There are displaced fracture deformities of the nasal bones which could represent acute or chronic process.  No overlying soft tissue swelling.  The orbital and facial bones appear otherwise intact.  IMPRESSION: Soft tissue swelling and diffuse enhancement of the soft tissues in the submental region in the upper lip consistent with cellulitis or inflammatory process.  No discrete abscess.  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  Age indeterminate nasal bone fractures.   Original Report Authenticated By: Burman Nieves, M.D.      Assessment/Plan Principal Problem:   Facial cellulitis Active Problems:   Anemia   1. Facial cellulitis/cellulitis of upper lip: Admit to medical floor. Continue IV vancomycin per pharmacy. NSAIDs and monitor. No drainable abscess on CT. 2. Anemia: Probably chronic. Follow CBC in a.m. 3. Leukocytosis: secondary to problem #1. Follow CBCs 4. Dental caries and periodontal disease: denies teeth pain. Outpatient followup.     Code Status: Full  Family Communication: Discussed with patient  Disposition Plan: Home when medically stable   Time spent: 50 minutes  [facility] Pager [phone]  If 7PM-7AM, please contact night-coverage [email or web address] Password [facility] 02/25/2013, 12:06  PM

## 2013-02-25 NOTE — Progress Notes (Signed)
ANTIBIOTIC CONSULT NOTE - INITIAL  Pharmacy Consult for vancomycin Indication: facial cellulitis  Allergies  Allergen Reactions  . Bee Venom Swelling    "Affects oxygen level"  . Latex Itching    & burning  . Tomato Swelling    Patient Measurements: Height: 5\' 4"  (162.6 cm) Weight: 174 lb (78.926 kg) IBW/kg (Calculated) : 54.7  Vital Signs: Temp: 98.7 F (37.1 C) (04/06 1100) Temp src: Oral (04/06 1100) BP: 115/76 mmHg (04/06 1100) Pulse Rate: 92 (04/06 1100) Intake/Output from previous day:   Intake/Output from this shift:    Labs:  Recent Labs  02/25/13 0655  WBC 11.9*  HGB 11.9*  PLT 421*  CREATININE 0.96   Estimated Creatinine Clearance: 79.2 ml/min (by C-G formula based on Cr of 0.96). No results found for this basename: VANCOTROUGH, VANCOPEAK, VANCORANDOM, GENTTROUGH, GENTPEAK, GENTRANDOM, TOBRATROUGH, TOBRAPEAK, TOBRARND, AMIKACINPEAK, AMIKACINTROU, AMIKACIN,  in the last 72 hours   Microbiology: No results found for this or any previous visit (from the past 720 hour(s)).  Medical History: Past Medical History  Diagnosis Date  . Preterm labor     Medications:  Scheduled:  . enoxaparin (LOVENOX) injection  40 mg Subcutaneous Q24H  . ibuprofen  600 mg Oral TID  . [COMPLETED]  morphine injection  4 mg Intravenous Once  . [COMPLETED]  morphine injection  4 mg Intravenous Once  . [COMPLETED]  morphine injection  4 mg Intravenous Once  . [COMPLETED] ondansetron (ZOFRAN) IV  4 mg Intravenous Once  . [COMPLETED] sodium chloride  1,000 mL Intravenous Once  . [COMPLETED] vancomycin  1,000 mg Intravenous Once   Infusions:  . sodium chloride     Assessment:  42 yo with 1 week of upper lip swelling and pain. Seen at [facility] yesterday and had attempted I and D and started on antibiotics. Lip has increased in size and has had increasing nausea and vomiting. Subjective fever and chills. No SOB or intraoral swelling.   1g of vancomycin given in ER at 0630 and to  start vancomycin per pharmacy dosing thereafter for facial cellulitis rule out abscess  Goal of Therapy:  Vancomycin trough level 10-15 mcg/ml  Plan:  1) Vancomycin 750mg  IV q12 2) Check trough at steady state prn   Hessie Knows, PharmD, BCPS Pager [phone] 02/25/2013 11:44 AM

## 2013-02-25 NOTE — ED Provider Notes (Signed)
History     CSN: [id number]  Arrival date & time 02/25/13  0517   First MD Initiated Contact with Patient 02/25/13 0531      Chief Complaint  Patient presents with  . Angioedema    (Consider location/radiation/quality/duration/timing/severity/associated sxs/prior treatment) HPI Pt with 1 week of upper lip swelling and pain. Seen at [facility] yesterday and had attempted I and D and started on antibiotics. Lip has increased in size and has had increasing nausea and vomiting. Subjective fever and chills. No SOB or intraoral swelling.  Past Medical History  Diagnosis Date  . Preterm labor     Past Surgical History  Procedure Laterality Date  . No past surgeries      Family History  Problem Relation Age of Onset  . Other Neg Hx   . Cerebral palsy Son     born at [redacted] weeks gestation    History  Substance Use Topics  . Smoking status: Never Smoker   . Smokeless tobacco: Not on file  . Alcohol Use: No    OB History   Grav Para Term Preterm Abortions TAB SAB Ect Mult Living   6 4 3 1 2 1 1   4       Review of Systems  Constitutional: Positive for fever and chills.  HENT: Positive for facial swelling. Negative for sore throat, trouble swallowing and neck stiffness.   Respiratory: Negative for shortness of breath.   Cardiovascular: Negative for chest pain.  Gastrointestinal: Positive for nausea and vomiting. Negative for abdominal pain, diarrhea and constipation.  Skin: Positive for color change and rash.  Neurological: Negative for dizziness, weakness, light-headedness, numbness and headaches.  All other systems reviewed and are negative.    Allergies  Bee venom; Latex; and Tomato  Home Medications   No current outpatient prescriptions on file.  BP 107/61  Pulse 64  Temp(Src) 98.2 F (36.8 C) (Oral)  Resp 18  Ht 5\' 4"  (1.626 m)  Wt 174 lb (78.926 kg)  BMI 29.85 kg/m2  SpO2 99%  LMP 02/12/2013  Physical Exam  Nursing note and vitals reviewed. Constitutional:  She is oriented to person, place, and time. She appears well-developed and well-nourished.  Pt appears uncomfortable  HENT:  Head: Normocephalic and atraumatic.  Mouth/Throat: Oropharynx is clear and moist.  Upper lip swelling, erythema, induration and tenderness. Erythema extending to maxillary region. No intraoral swelling  Eyes: EOM are normal. Pupils are equal, round, and reactive to light.  Neck: Normal range of motion. Neck supple.  Cardiovascular: Normal rate and regular rhythm.   Pulmonary/Chest: Effort normal and breath sounds normal. No stridor. No respiratory distress. She has no wheezes. She has no rales.  Abdominal: Soft. Bowel sounds are normal. She exhibits no mass. There is no tenderness. There is no rebound and no guarding.  Musculoskeletal: Normal range of motion. She exhibits no edema and no tenderness.  Neurological: She is alert and oriented to person, place, and time.  Skin: Skin is warm and dry. No rash noted. No erythema.  Psychiatric: She has a normal mood and affect. Her behavior is normal.    ED Course  Procedures (including critical care time)  Labs Reviewed  CBC WITH DIFFERENTIAL - Abnormal; Notable for the following:    WBC 11.9 (*)    Hemoglobin 11.9 (*)    Platelets 421 (*)    Neutro Abs 8.7 (*)    All other components within normal limits  COMPREHENSIVE METABOLIC PANEL - Abnormal; Notable for the following:  Sodium 134 (*)    Albumin 3.3 (*)    Total Bilirubin 0.2 (*)    GFR calc non Af Amer 73 (*)    GFR calc Af Amer 85 (*)    All other components within normal limits  CBC - Abnormal; Notable for the following:    RBC 3.68 (*)    Hemoglobin 10.9 (*)    HCT 33.3 (*)    Platelets 410 (*)    All other components within normal limits  HCG, SERUM, QUALITATIVE   Ct Maxillofacial W/cm  02/25/2013  *RADIOLOGY REPORT*  Clinical Data: Facial swelling for 1 week.  CT MAXILLOFACIAL WITH CONTRAST  Technique:  Multidetector CT imaging of the  maxillofacial structures was performed with intravenous contrast. Multiplanar CT image reconstructions were also generated.  Contrast:  100 ml Omnipaque 300  Comparison: CT head cervical spine 05/22/2006.  Findings: Soft tissue infiltration and enhancement in the submental fat and anterior to the mandible and upper lip.  The appearance suggests an inflammatory process such as cellulitis.  Suggestion of small focal subcutaneous emphysema.  No focal fluid collection to suggest an abscess.  Large dental caries involving the left upper molars, presumably representing first and second molars with previous resection of the third molars.  Minimal peri apical lucency around the second molar may represent.  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  The paranasal sinuses are clear.  There are displaced fracture deformities of the nasal bones which could represent acute or chronic process.  No overlying soft tissue swelling.  The orbital and facial bones appear otherwise intact.  IMPRESSION: Soft tissue swelling and diffuse enhancement of the soft tissues in the submental region in the upper lip consistent with cellulitis or inflammatory process.  No discrete abscess.  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  Age indeterminate nasal bone fractures.   Original Report Authenticated By: Burman Nieves, M.D.      1. Facial cellulitis       MDM  Facial cellulitis failing out pt treatment. Will get CT to r/o abscess.   Pt handed over to Dr Dione Booze pending labs. Will likely need admission for IV abx.      Loren Racer, MD 02/26/13 [phone]

## 2013-02-25 NOTE — ED Provider Notes (Signed)
History     CSN: [id number]  Arrival date & time 02/23/13  2223   None     Chief Complaint  Patient presents with  . Oral Swelling    (Consider location/radiation/quality/duration/timing/severity/associated sxs/prior treatment) HPI History provided by pt.   Pt developed multiple, pruritic, non-draining, perioral lesions one week ago.  Attributed to allergic reaction to tomatoes at that time.  Lesions inferior to lower lip stable, but now w/ edema and severe pain L upper lip.  Pain radiates to rest of L side of face.  Has applied both cold and warm compresses w/out relief.  No associated fever, other rash, tongue edema, throat tightening, GU sx.   Past Medical History  Diagnosis Date  . Preterm labor     Past Surgical History  Procedure Laterality Date  . No past surgeries      Family History  Problem Relation Age of Onset  . Other Neg Hx   . Cerebral palsy Son     born at [redacted] weeks gestation    History  Substance Use Topics  . Smoking status: Never Smoker   . Smokeless tobacco: Not on file  . Alcohol Use: No    OB History   Grav Para Term Preterm Abortions TAB SAB Ect Mult Living   6 4 3 1 2 1 1   4       Review of Systems  All other systems reviewed and are negative.    Allergies  Bee venom; Latex; and Tomato  Home Medications  No current outpatient prescriptions on file.  BP 114/60  Pulse 94  Temp(Src) 98.6 F (37 C) (Oral)  Resp 16  SpO2 100%  LMP 02/12/2013  Physical Exam  Nursing note and vitals reviewed. Constitutional: She is oriented to person, place, and time. She appears well-developed and well-nourished. No distress.  HENT:  Head: Normocephalic and atraumatic.  Multiple 2-110mm vesicles at vermilion border lower lip.  No drainage or surrounding erythema.  Single, scabbed lesion at vermilion border L upper lip w/ surrounding erythema, edema and induration.  Upper lip ttp.  No lesions w/in mouth.  No tongue edema.  Nml posterior pharynx and  tonsils.    Eyes:  Normal appearance  Neck: Normal range of motion. Neck supple.  Cardiovascular: Normal rate and regular rhythm.   Pulmonary/Chest: Effort normal and breath sounds normal. No stridor. No respiratory distress.  Musculoskeletal: Normal range of motion.  Lymphadenopathy:    She has no cervical adenopathy.  Neurological: She is alert and oriented to person, place, and time.  Skin: Skin is warm and dry. No rash noted.  Psychiatric: She has a normal mood and affect. Her behavior is normal.    ED Course  Procedures (including critical care time)  Labs Reviewed - No data to display Ct Maxillofacial W/cm  02/25/2013  *RADIOLOGY REPORT*  Clinical Data: Facial swelling for 1 week.  CT MAXILLOFACIAL WITH CONTRAST  Technique:  Multidetector CT imaging of the maxillofacial structures was performed with intravenous contrast. Multiplanar CT image reconstructions were also generated.  Contrast:  100 ml Omnipaque 300  Comparison: CT head cervical spine 05/22/2006.  Findings: Soft tissue infiltration and enhancement in the submental fat and anterior to the mandible and upper lip.  The appearance suggests an inflammatory process such as cellulitis.  Suggestion of small focal subcutaneous emphysema.  No focal fluid collection to suggest an abscess.  Large dental caries involving the left upper molars, presumably representing first and second molars with previous resection of  the third molars.  Minimal peri apical lucency around the second molar may represent.  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  The paranasal sinuses are clear.  There are displaced fracture deformities of the nasal bones which could represent acute or chronic process.  No overlying soft tissue swelling.  The orbital and facial bones appear otherwise intact.  IMPRESSION: Soft tissue swelling and diffuse enhancement of the soft tissues in the submental region in the upper lip consistent with cellulitis or  inflammatory process.  No discrete abscess.  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  Age indeterminate nasal bone fractures.   Original Report Authenticated By: Burman Nieves, M.D.      1. Abscess of lip       MDM  41yo healthy F presents w/ perioral rash x 1wk, the vesicle at L upper lip painful and w/ surrounding edema, erythema and induration.  Abscess suspected and needle aspiration attempted.  Upper lip numbed w/ 2mL lidocaine w/out epi, 18 gauge needle inserted, and no fluid removed.  Pt tolerated procedure well.  D/c'd home w/ clindamycin and percocet for cellulitis +/- abscess.  Recommended that she continue warm compresses.  Return precautions discussed.         Otilio Miu, PA-C 02/25/13 [phone]

## 2013-02-26 ENCOUNTER — Encounter (HOSPITAL_COMMUNITY): Payer: Self-pay | Admitting: Internal Medicine

## 2013-02-26 DIAGNOSIS — Z9109 Other allergy status, other than to drugs and biological substances: Secondary | ICD-10-CM

## 2013-02-26 DIAGNOSIS — Z889 Allergy status to unspecified drugs, medicaments and biological substances status: Secondary | ICD-10-CM

## 2013-02-26 DIAGNOSIS — L03211 Cellulitis of face: Principal | ICD-10-CM

## 2013-02-26 DIAGNOSIS — L0201 Cutaneous abscess of face: Principal | ICD-10-CM

## 2013-02-26 DIAGNOSIS — D649 Anemia, unspecified: Secondary | ICD-10-CM

## 2013-02-26 HISTORY — DX: Allergy status to unspecified drugs, medicaments and biological substances: Z88.9

## 2013-02-26 LAB — IRON AND TIBC: Saturation Ratios: 11 % — ABNORMAL LOW (ref 20–55)

## 2013-02-26 LAB — CBC
MCH: 29.6 pg (ref 26.0–34.0)
Platelets: 410 10*3/uL — ABNORMAL HIGH (ref 150–400)
RBC: 3.68 MIL/uL — ABNORMAL LOW (ref 3.87–5.11)
RDW: 13.4 % (ref 11.5–15.5)

## 2013-02-26 LAB — FERRITIN: Ferritin: 35 ng/mL (ref 10–291)

## 2013-02-26 MED ORDER — VANCOMYCIN HCL IN DEXTROSE 1-5 GM/200ML-% IV SOLN
1000.0000 mg | Freq: Two times a day (BID) | INTRAVENOUS | Status: DC
  Administered 2013-02-26 – 2013-02-27 (×2): 1000 mg via INTRAVENOUS
  Filled 2013-02-26 (×3): qty 200

## 2013-02-26 MED ORDER — IBUPROFEN 400 MG PO TABS
400.0000 mg | ORAL_TABLET | Freq: Three times a day (TID) | ORAL | Status: AC
  Administered 2013-02-26 (×2): 400 mg via ORAL
  Filled 2013-02-26 (×2): qty 1

## 2013-02-26 MED ORDER — FAMOTIDINE 20 MG PO TABS
20.0000 mg | ORAL_TABLET | Freq: Two times a day (BID) | ORAL | Status: DC
  Administered 2013-02-26 (×2): 20 mg via ORAL
  Filled 2013-02-26 (×4): qty 1

## 2013-02-26 MED ORDER — VITAMINS A & D EX OINT
TOPICAL_OINTMENT | CUTANEOUS | Status: AC
  Administered 2013-02-26: 08:00:00
  Filled 2013-02-26: qty 5

## 2013-02-26 NOTE — Progress Notes (Signed)
Pharmacy: Brief Note  Vancomycin dose increased to 1 gram IV q12h for updated weight and renal function   Kelsey Edman, Loma Messing PharmD Pager #: [phone] 1:26 PM 02/26/2013

## 2013-02-26 NOTE — Progress Notes (Signed)
Subjective: The patient's feels a little groggy from the pain medication, otherwise she believes that some of the swelling and redness of her upper lip has subsided. She has no difficulty swallowing, eating, or breathing.  Objective: Vital signs in last 24 hours: Filed Vitals:   02/25/13 1100 02/25/13 1400 02/25/13 2100 02/26/13 0534  BP: 115/76 108/67 111/61 107/61  Pulse: 92 110 85 64  Temp: 98.7 F (37.1 C) 97.5 F (36.4 C) 98.5 F (36.9 C) 98.2 F (36.8 C)  TempSrc: Oral Oral Oral Oral  Resp: 18 16 18 18   Height: 5\' 4"  (1.626 m)     Weight: 78.926 kg (174 lb)     SpO2: 98% 100% 100% 99%    Intake/Output Summary (Last 24 hours) at 02/26/13 1023 Last data filed at 02/26/13 0507  Gross per 24 hour  Intake    870 ml  Output      6 ml  Net    864 ml    Weight change:  Physical exam: General: Morbidly obese 42 year old African-American woman laying in bed, in no acute distress. HEENT: Head is cephalic, nontraumatic. Pupils equal, round, reactive to light. No periorbital edema or erythema. Her upper lip is edematous/swollen and has a small amount of serous fluid oozing from it. It is mildly erythematous and tender. No swelling of the lower lip. No posterior exudates or erythema. Lungs: Clear to auscultation bilaterally. Heart: S1, S2, with no murmurs rubs or gallops. Abdomen: Positive bowel sounds, obese, nontender, nondistended. Extremities: No pedal edema. Neurologic: She is alert and oriented x3. Cranial nerves II through XII are intact.  Lab Results: Basic Metabolic Panel:  Recent Labs  16/10/96 0655  NA 134*  K 4.1  CL 102  CO2 25  GLUCOSE 97  BUN 9  CREATININE 0.96  CALCIUM 9.0   Liver Function Tests:  Recent Labs  02/25/13 0655  AST 15  ALT 16  ALKPHOS 86  BILITOT 0.2*  PROT 7.6  ALBUMIN 3.3*   No results found for this basename: LIPASE, AMYLASE,  in the last 72 hours No results found for this basename: AMMONIA,  in the last 72  hours CBC:  Recent Labs  02/25/13 0655 02/26/13 0415  WBC 11.9* 9.0  NEUTROABS 8.7*  --   HGB 11.9* 10.9*  HCT 36.4 33.3*  MCV 90.1 90.5  PLT 421* 410*   Cardiac Enzymes: No results found for this basename: CKTOTAL, CKMB, CKMBINDEX, TROPONINI,  in the last 72 hours BNP: No results found for this basename: PROBNP,  in the last 72 hours D-Dimer: No results found for this basename: DDIMER,  in the last 72 hours CBG: No results found for this basename: GLUCAP,  in the last 72 hours Hemoglobin A1C: No results found for this basename: HGBA1C,  in the last 72 hours Fasting Lipid Panel: No results found for this basename: CHOL, HDL, LDLCALC, TRIG, CHOLHDL, LDLDIRECT,  in the last 72 hours Thyroid Function Tests: No results found for this basename: TSH, T4TOTAL, FREET4, T3FREE, THYROIDAB,  in the last 72 hours Anemia Panel: No results found for this basename: VITAMINB12, FOLATE, FERRITIN, TIBC, IRON, RETICCTPCT,  in the last 72 hours Coagulation: No results found for this basename: LABPROT, INR,  in the last 72 hours Urine Drug Screen: Drugs of Abuse     Component Value Date/Time   LABOPIA NONE DETECTED 08/07/2012 1830   COCAINSCRNUR POSITIVE* 08/07/2012 1830   LABBENZ NONE DETECTED 08/07/2012 1830   AMPHETMU NONE DETECTED 08/07/2012 1830  THCU POSITIVE* 08/07/2012 1830   LABBARB NONE DETECTED 08/07/2012 1830    Alcohol Level: No results found for this basename: ETH,  in the last 72 hours Urinalysis: No results found for this basename: COLORURINE, APPERANCEUR, LABSPEC, PHURINE, GLUCOSEU, HGBUR, BILIRUBINUR, KETONESUR, PROTEINUR, UROBILINOGEN, NITRITE, LEUKOCYTESUR,  in the last 72 hours Misc. Labs:   Micro: No results found for this or any previous visit (from the past 240 hour(s)).  Studies/Results: Ct Maxillofacial W/cm  02/25/2013  *RADIOLOGY REPORT*  Clinical Data: Facial swelling for 1 week.  CT MAXILLOFACIAL WITH CONTRAST  Technique:  Multidetector CT imaging of the  maxillofacial structures was performed with intravenous contrast. Multiplanar CT image reconstructions were also generated.  Contrast:  100 ml Omnipaque 300  Comparison: CT head cervical spine 05/22/2006.  Findings: Soft tissue infiltration and enhancement in the submental fat and anterior to the mandible and upper lip.  The appearance suggests an inflammatory process such as cellulitis.  Suggestion of small focal subcutaneous emphysema.  No focal fluid collection to suggest an abscess.  Large dental caries involving the left upper molars, presumably representing first and second molars with previous resection of the third molars.  Minimal peri apical lucency around the second molar may represent.  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  The paranasal sinuses are clear.  There are displaced fracture deformities of the nasal bones which could represent acute or chronic process.  No overlying soft tissue swelling.  The orbital and facial bones appear otherwise intact.  IMPRESSION: Soft tissue swelling and diffuse enhancement of the soft tissues in the submental region in the upper lip consistent with cellulitis or inflammatory process.  No discrete abscess.  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  Age indeterminate nasal bone fractures.   Original Report Authenticated By: Burman Nieves, M.D.     Medications:  Scheduled: . enoxaparin (LOVENOX) injection  40 mg Subcutaneous Q24H  . ibuprofen  600 mg Oral TID  . vancomycin  750 mg Intravenous Q12H   Continuous: . sodium chloride 1,000 mL (02/25/13 1432)   UJW:JXBJYNWGNFAOZ, acetaminophen, albuterol, diphenhydrAMINE, HYDROcodone-acetaminophen, HYDROmorphone (DILAUDID) injection, ondansetron (ZOFRAN) IV, ondansetron  Assessment: Principal Problem:   Facial cellulitis Active Problems:   Anemia   History of allergic reaction   Morbid obesity   1. Facial cellulitis which has now been limited mostly  to the upper lip. Status post needle aspiration in the ED on 02/24/2013. Facial CT scan results noted. No evidence of abscess. We'll continue IV vancomycin, opiate  analgesics, ibuprofen and supportive treatment.  History of recent allergic to reaction to tomatoes. Mostly resolved with Benadryl prior to this hospitalization. Will continue Benadryl when necessary. We'll add Pepcid twice a day empirically.  Normocytic anemia. We'll order a few anemia studies.  Plan:  1. We'll continue supportive treatment. 2. We'll decrease the dose of ibuprofen. Will give Pepcid twice a day empirically. 3. We'll order a total iron, ferritin, and vitamin B12.    LOS: 1 day   Kavontae Pritchard 02/26/2013, 10:23 AM

## 2013-02-27 DIAGNOSIS — F1910 Other psychoactive substance abuse, uncomplicated: Secondary | ICD-10-CM

## 2013-02-27 LAB — CBC
HCT: 34.1 % — ABNORMAL LOW (ref 36.0–46.0)
Hemoglobin: 11 g/dL — ABNORMAL LOW (ref 12.0–15.0)
MCH: 29.1 pg (ref 26.0–34.0)
MCHC: 32.3 g/dL (ref 30.0–36.0)
MCV: 90.2 fL (ref 78.0–100.0)
Platelets: 411 10*3/uL — ABNORMAL HIGH (ref 150–400)

## 2013-02-27 LAB — BASIC METABOLIC PANEL
BUN: 10 mg/dL (ref 6–23)
CO2: 28 mEq/L (ref 19–32)
Chloride: 101 mEq/L (ref 96–112)
GFR calc non Af Amer: 81 mL/min — ABNORMAL LOW (ref >90)
Glucose, Bld: 102 mg/dL — ABNORMAL HIGH (ref 70–99)
Potassium: 3.7 mEq/L (ref 3.5–5.1)

## 2013-02-27 MED ORDER — CLINDAMYCIN HCL 150 MG PO CAPS: 300.0000 mg | ORAL_CAPSULE | Freq: Two times a day (BID) | ORAL | Status: DC

## 2013-02-27 MED ORDER — OXYCODONE-ACETAMINOPHEN 5-325 MG PO TABS: 1.0000 | ORAL_TABLET | ORAL | Status: DC | PRN

## 2013-02-27 MED ORDER — DIPHENHYDRAMINE HCL 25 MG PO CAPS: 25.0000 mg | ORAL_CAPSULE | Freq: Four times a day (QID) | ORAL | Status: DC | PRN

## 2013-02-27 NOTE — Care Management (Signed)
CM spoke with patient concerning discharge planning. Pt insured. Pt states is patient at [facility]. No HH services or DME needs stated. Pt has tx home. Pt provided with list of Medicaid [facility].   Roxy Manns Braelynn Lupton,RN,BSN [phone]

## 2013-02-27 NOTE — Discharge Summary (Signed)
Physician Discharge Summary  Beth Jacobs ZOX:[id number] DOB: Feb 08, 1971 DOA: 02/25/2013  PCP: No PCP Per Patient  Admit date: 02/25/2013 Discharge date: 02/27/2013  Time spent: Less than 30 minutes  Recommendations for Outpatient Follow-up:  1. The patient was advised to followup with her primary care provider as needed. She will followup with her gynecologist as scheduled.  Discharge Diagnoses:  1. Facial cellulitis. Status post needle aspiration in the ED on 02/24/2013 before this hospitalization. 2. Recent allergic reaction to tomatoes, prior to this hospitalization. 3. Normocytic anemia in a patient who is 3 months postpartum. 4. Morbid obesity.  Discharge Condition: Improved.  Diet recommendation: Regular.  Filed Weights   02/25/13 1100  Weight: 78.926 kg (174 lb)    History of present illness:   Beth Jacobs is a 42 y.o. Female who is 3 months post-partum and has a history of anemia, presented to ED on 02/25/13 with complaints of upper lip pain, redness and swelling. She was in her usual state of health until 5 days ago. She apparently ate food with tomatoes in it (allergic to tomatoes) and within half an hour noticed painless swelling of both lips, some blisters and some discomfort in her throat. She took Benadryl and her symptoms subsided. However over the next 2 days, she noticed slightly worsened swelling and pain of the upper lip which was not getting better. She was seen in the ED on 02/24/2013 and a needle was inserted into upper lip in an attempt to drain pus but there was none. She was discharged on antibiotics. She now presented with worsening pain, redness and swelling of the upper lip but denied any difficulty breathing or swallowing or tongue swelling. There is mild itching of her lips. She had 2 episodes of vomiting yesterday but has since tolerated her diet. She denies fevers but had some chills. In the ED, CT scan of her face was suggestive of upper lip cellulitis without  obvious abscess. Her was hemoglobin 11.9, Morgenstern blood cell 11.9 and serum pregnancy test negative. She received a dose of vancomycin IV in the ED. The hospitalist service was asked to admit for further management.     Hospital Course:   1. Facial cellulitis. Antibiotic therapy was continued with Vancomycin. Antiinflammatory and opiate medications were ordered for treatment of swelling and pain. Over the course of the hospitalization, the patient's facial/lip edema and erythema subsided substantially. She was discharged on clindamycin, which was started before she was hospitalized.   History of recent allergic to reaction to tomatoes. There was no evidence of allergic-induced angioedema, however, she was continued on Benadryl and Pepcid.   Normocytic anemia. Her ferritin was low-normal at 35 and her iron was low at 29. She was instructed to continued vitamin/iron supplementation following discharge.   Procedures:  None  Consultations:  None  Discharge Exam: Filed Vitals:   02/26/13 0534 02/26/13 1357 02/26/13 2136 02/27/13 0632  BP: 107/61 110/56 108/52 123/56  Pulse: 64 86 82 71  Temp: 98.2 F (36.8 C) 97.4 F (36.3 C) 98.5 F (36.9 C) 98.3 F (36.8 C)  TempSrc: Oral Oral Oral Oral  Resp: 18 18 18 18   Height:      Weight:      SpO2: 99% 99% 100% 97%    General: No acute distress. Oral pharynx. Significant decrease in erythema and edema of the upper lip. No drainage. Cardiovascular: S1, S2, no murmurs rubs or gallops. Respiratory: Clear to auscultation bilaterally.  Discharge Instructions  Discharge Orders  Future Orders Complete By Expires     Diet general  As directed     Discharge wound care:  As directed     Comments:      Ice and/or warm compresses to upper lip  2-3 times daily as tolerated    Increase activity slowly  As directed         Medication List    STOP taking these medications       naproxen 375 MG tablet  Commonly known as:  NAPROSYN       TAKE these medications       clindamycin 150 MG capsule  Commonly known as:  CLEOCIN  Take 2 capsules (300 mg total) by mouth 2 (two) times daily.     diphenhydrAMINE 25 mg capsule  Commonly known as:  BENADRYL  Take 1 capsule (25 mg total) by mouth every 6 (six) hours as needed for itching or allergies.     ferrous fumarate 325 (106 FE) MG Tabs  Commonly known as:  HEMOCYTE - 106 mg FE  Take 1 tablet by mouth every morning.     multivitamin-prenatal 27-0.8 MG Tabs  Take 1 tablet by mouth every morning.     oxyCODONE-acetaminophen 5-325 MG per tablet  Commonly known as:  PERCOCET/ROXICET  Take 1 tablet by mouth every 4 (four) hours as needed for pain.          The results of significant diagnostics from this hospitalization (including imaging, microbiology, ancillary and laboratory) are listed below for reference.    Significant Diagnostic Studies: Ct Maxillofacial W/cm  02/25/2013  *RADIOLOGY REPORT*  Clinical Data: Facial swelling for 1 week.  CT MAXILLOFACIAL WITH CONTRAST  Technique:  Multidetector CT imaging of the maxillofacial structures was performed with intravenous contrast. Multiplanar CT image reconstructions were also generated.  Contrast:  100 ml Omnipaque 300  Comparison: CT head cervical spine 05/22/2006.  Findings: Soft tissue infiltration and enhancement in the submental fat and anterior to the mandible and upper lip.  The appearance suggests an inflammatory process such as cellulitis.  Suggestion of small focal subcutaneous emphysema.  No focal fluid collection to suggest an abscess.  Large dental caries involving the left upper molars, presumably representing first and second molars with previous resection of the third molars.  Minimal peri apical lucency around the second molar may represent.  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  The paranasal sinuses are clear.  There are displaced fracture deformities of the nasal bones which could  represent acute or chronic process.  No overlying soft tissue swelling.  The orbital and facial bones appear otherwise intact.  IMPRESSION: Soft tissue swelling and diffuse enhancement of the soft tissues in the submental region in the upper lip consistent with cellulitis or inflammatory process.  No discrete abscess.  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  Age indeterminate nasal bone fractures.   Original Report Authenticated By: Burman Nieves, M.D.     Microbiology: No results found for this or any previous visit (from the past 240 hour(s)).   Labs: Basic Metabolic Panel:  Recent Labs Lab 02/25/13 0655 02/27/13 0424  NA 134* 134*  K 4.1 3.7  CL 102 101  CO2 25 28  GLUCOSE 97 102*  BUN 9 10  CREATININE 0.96 0.88  CALCIUM 9.0 9.1   Liver Function Tests:  Recent Labs Lab 02/25/13 0655  AST 15  ALT 16  ALKPHOS 86  BILITOT 0.2*  PROT 7.6  ALBUMIN  3.3*   No results found for this basename: LIPASE, AMYLASE,  in the last 168 hours No results found for this basename: AMMONIA,  in the last 168 hours CBC:  Recent Labs Lab 02/25/13 0655 02/26/13 0415 02/27/13 0424  WBC 11.9* 9.0 5.4  NEUTROABS 8.7*  --   --   HGB 11.9* 10.9* 11.0*  HCT 36.4 33.3* 34.1*  MCV 90.1 90.5 90.2  PLT 421* 410* 411*   Cardiac Enzymes: No results found for this basename: CKTOTAL, CKMB, CKMBINDEX, TROPONINI,  in the last 168 hours BNP: BNP (last 3 results) No results found for this basename: PROBNP,  in the last 8760 hours CBG: No results found for this basename: GLUCAP,  in the last 168 hours     Signed:  Demika Langenderfer  [facility] 02/27/2013, 6:43 PM

## 2013-03-04 NOTE — ED Provider Notes (Signed)
Medical screening examination/treatment/procedure(s) were performed by non-physician practitioner and as supervising physician I was immediately available for consultation/collaboration.  Olivia Mackie, MD 03/04/13 2158

## 2013-05-18 ENCOUNTER — Encounter (HOSPITAL_COMMUNITY): Payer: Self-pay | Admitting: Emergency Medicine

## 2013-05-18 ENCOUNTER — Emergency Department (HOSPITAL_COMMUNITY)
Admission: EM | Admit: 2013-05-18 | Discharge: 2013-05-18 | Disposition: A | Discharge status: Home / Self Care | Payer: Medicaid Other | Source: Home / Self Care

## 2013-05-18 DIAGNOSIS — M722 Plantar fascial fibromatosis: Secondary | ICD-10-CM

## 2013-05-18 MED ORDER — DICLOFENAC SODIUM 75 MG PO TBEC: 75.0000 mg | DELAYED_RELEASE_TABLET | Freq: Two times a day (BID) | ORAL | Status: DC

## 2013-05-18 MED ORDER — HYDROCODONE-ACETAMINOPHEN 5-325 MG PO TABS: ORAL_TABLET | ORAL | Status: DC

## 2013-05-18 NOTE — ED Notes (Signed)
Pt c/o right heel and ankle pain onset March... Last 3/4 days have gotten worse... Pain increases when she bears wt on it... Denies: inj/trauma, swelling... She is alert w/no signs of acute distress.

## 2013-05-18 NOTE — ED Provider Notes (Signed)
Chief Complaint:   Chief Complaint  Patient presents with  . Foot Pain    History of Present Illness:   Beth Jacobs is a 42 year old female who has had a three-month history of right plantar heel pain. This radiates into the medial and lateral aspects of the calcaneus and into the ankle and Achilles as well. Most of the pain is in the plantar surface however. It hurts in the morning when she first gets up. Then gets a little bit better but gets worse again in the afternoon after she cannot for while. She can feel a sensation of swelling in the plantar surface of the heel. There is no numbness, tingling, or weakness.  Review of Systems:  Other than noted above, the patient denies any of the following symptoms: Systemic:  No fevers, chills, sweats, or aches.  No fatigue or tiredness. Musculoskeletal:  No joint pain, arthritis, bursitis, swelling, back pain, or neck pain. Neurological:  No muscular weakness, paresthesias, headache, or trouble with speech or coordination.  No dizziness.  PMFSH:  Past medical history, family history, social history, meds, and allergies were reviewed.    Physical Exam:   Vital signs:  BP 112/80  Pulse 80  Temp(Src) 97.5 F (36.4 C) (Oral)  Resp 20  SpO2 100%  LMP 04/27/2013  Breastfeeding? No Gen:  Alert and oriented times 3.  In no distress. Musculoskeletal: There is pain to palpation over the insertion of the plantar fascia. No obvious swelling. There is also pain over the medial and lateral aspect of the calcaneus. There's no pain posteriorly or over the Achilles tendon.  Otherwise, all joints had a full a ROM with no swelling, bruising or deformity.  No edema, pulses full. Extremities were warm and pink.  Capillary refill was brisk.  Skin:  Clear, warm and dry.  No rash. Neuro:  Alert and oriented times 3.  Muscle strength was normal.  Sensation was intact to light touch.   Assessment:  The encounter diagnosis was Plantar fasciitis.  I gave her the  option of corticosteroid injection versus conservative treatment. She opted for more conservative treatment, although she was given the name of Dr. Cristie Hem to followup with if her symptoms should fail to improve.  Plan:   1.  The following meds were prescribed:   Discharge Medication List as of 05/18/2013  7:51 PM    START taking these medications   Details  diclofenac (VOLTAREN) 75 MG EC tablet Take 1 tablet (75 mg total) by mouth 2 (two) times daily., Starting 05/18/2013, Until Discontinued, Normal    HYDROcodone-acetaminophen (NORCO/VICODIN) 5-325 MG per tablet 1 to 2 tabs every 4 to 6 hours as needed for pain., Print       2.  The patient was instructed in symptomatic care, including rest and activity, elevation, application of ice and compression.  Appropriate handouts were given. 3.  The patient was told to return if becoming worse in any way, if no better in 3 or 4 days, and given some red flag symptoms such as worsening pain or difficulty with ambulation that would indicate earlier return.   4.  The patient was told to follow up with Dr. Cristie Hem as needed.    Reuben Likes, MD 05/18/13 2051

## 2013-07-13 ENCOUNTER — Emergency Department (HOSPITAL_COMMUNITY)
Admission: EM | Admit: 2013-07-13 | Discharge: 2013-07-13 | Disposition: A | Discharge status: Home / Self Care | Payer: Medicaid Other | Attending: Emergency Medicine | Admitting: Emergency Medicine

## 2013-07-13 ENCOUNTER — Encounter (HOSPITAL_COMMUNITY): Payer: Self-pay | Admitting: Emergency Medicine

## 2013-07-13 ENCOUNTER — Emergency Department (HOSPITAL_COMMUNITY)
Admission: EM | Admit: 2013-07-13 | Discharge: 2013-07-13 | Disposition: A | Discharge status: Home / Self Care | Payer: Medicaid Other | Source: Home / Self Care

## 2013-07-13 DIAGNOSIS — K029 Dental caries, unspecified: Secondary | ICD-10-CM

## 2013-07-13 DIAGNOSIS — Z91040 Latex allergy status: Secondary | ICD-10-CM | POA: Insufficient documentation

## 2013-07-13 DIAGNOSIS — Z8751 Personal history of pre-term labor: Secondary | ICD-10-CM | POA: Insufficient documentation

## 2013-07-13 DIAGNOSIS — K047 Periapical abscess without sinus: Secondary | ICD-10-CM | POA: Insufficient documentation

## 2013-07-13 MED ORDER — PENICILLIN V POTASSIUM 500 MG PO TABS: 500.0000 mg | ORAL_TABLET | Freq: Four times a day (QID) | ORAL | Status: DC

## 2013-07-13 MED ORDER — OXYCODONE-ACETAMINOPHEN 5-325 MG PO TABS
2.0000 | ORAL_TABLET | Freq: Once | ORAL | Status: AC
  Administered 2013-07-13: 2 via ORAL
  Filled 2013-07-13: qty 2

## 2013-07-13 MED ORDER — OXYCODONE-ACETAMINOPHEN 5-325 MG PO TABS: 1.0000 | ORAL_TABLET | ORAL | Status: DC | PRN

## 2013-07-13 NOTE — ED Provider Notes (Signed)
Medical screening examination/treatment/procedure(s) were performed by non-physician practitioner and as supervising physician I was immediately available for consultation/collaboration.  Stokes Rattigan M Fredis Malkiewicz, MD 07/13/13 2307 

## 2013-07-13 NOTE — ED Provider Notes (Signed)
CSN: [id number]     Arrival date & time 07/13/13  1759 History  This chart was scribed for Dierdre Forth, PA working with Lyanne Co, MD by Quintella Reichert, ED Scribe. This patient was seen in room TR07C/TR07C and the patient's care was started at 6:44 PM.     Chief Complaint  Patient presents with  . Dental Pain    The history is provided by the patient. No language interpreter was used.    HPI Comments: Beth Jacobs is a 42 y.o. female who presents to the Emergency Department complaining of constant, gradual-onset, gradually-worsening left upper molar pain that began 4 days ago.  Pt states she had a root canal to that tooth several years ago and the packing has come out.  She also states there is an abscess in that area.  She called her dentist but could not get an appointment until 9/15 and states "the pain is unbearable."  Pain is non-radiating.  She states her face feels swollen in that area as well.  She has attempted to treat pain with ibuprofen, Tylenol, and hot compresses, without relief.  She denies fever, chills, nausea, or vomiting.  She denies chronic medical conditions or regular medication usage.  She denies medication allergies.    Past Medical History  Diagnosis Date  . Preterm labor   . History of allergic reaction 02/26/2013    Past Surgical History  Procedure Laterality Date  . No past surgeries      Family History  Problem Relation Age of Onset  . Other Neg Hx   . Cerebral palsy Son     born at [redacted] weeks gestation    History  Substance Use Topics  . Smoking status: Never Smoker   . Smokeless tobacco: Not on file  . Alcohol Use: No    OB History   Grav Para Term Preterm Abortions TAB SAB Ect Mult Living   6 4 3 1 2 1 1   4        Review of Systems  Constitutional: Negative for fever, chills and appetite change.  HENT: Positive for facial swelling and dental problem. Negative for ear pain, nosebleeds, rhinorrhea, drooling, trouble  swallowing, neck pain, neck stiffness and postnasal drip.   Eyes: Negative for pain and redness.  Respiratory: Negative for cough and wheezing.   Cardiovascular: Negative for chest pain.  Gastrointestinal: Negative for nausea, vomiting and abdominal pain.  Skin: Negative for color change and rash.  Neurological: Negative for weakness and light-headedness.  All other systems reviewed and are negative.      Allergies  Bee venom; Latex; and Tomato  Home Medications   Current Outpatient Rx  Name  Route  Sig  Dispense  Refill  . oxyCODONE-acetaminophen (PERCOCET/ROXICET) 5-325 MG per tablet   Oral   Take 1-2 tablets by mouth every 4 (four) hours as needed for pain.   21 tablet   0   . penicillin v potassium (VEETID) 500 MG tablet   Oral   Take 1 tablet (500 mg total) by mouth 4 (four) times daily.   40 tablet   0     BP 136/99  Pulse 87  Temp(Src) 98.7 F (37.1 C) (Oral)  Resp 18  SpO2 98%  Physical Exam  Nursing note and vitals reviewed. Constitutional: She is oriented to person, place, and time. She appears well-developed and well-nourished.  HENT:  Head: Normocephalic and atraumatic.  Right Ear: Tympanic membrane, external ear and ear canal normal. Tympanic  membrane is not erythematous and not bulging.  Left Ear: Tympanic membrane, external ear and ear canal normal. Tympanic membrane is not erythematous and not bulging.  Nose: Nose normal. Right sinus exhibits no maxillary sinus tenderness and no frontal sinus tenderness. Left sinus exhibits no maxillary sinus tenderness and no frontal sinus tenderness.  Mouth/Throat: Uvula is midline, oropharynx is clear and moist and mucous membranes are normal. No oral lesions. No trismus in the jaw. Abnormal dentition. Dental caries present. No edematous or lacerations. No oropharyngeal exudate, posterior oropharyngeal edema, posterior oropharyngeal erythema or tonsillar abscesses.    Mild facial swelling of the L side of the  face without lip involvement  Eyes: Conjunctivae are normal. Pupils are equal, round, and reactive to light. Right eye exhibits no discharge. Left eye exhibits no discharge.  Neck: Normal range of motion. Neck supple.  Cardiovascular: Normal rate, regular rhythm and normal heart sounds.   No murmur heard. Pulmonary/Chest: Effort normal and breath sounds normal. No respiratory distress. She has no wheezes.  Abdominal: Soft. There is no tenderness.  Lymphadenopathy:    She has no cervical adenopathy.  Neurological: She is alert and oriented to person, place, and time. She exhibits normal muscle tone. Coordination normal.  Skin: Skin is warm and dry. No rash noted. No erythema.  No erythema or induration of the facial tissue  Psychiatric: She has a normal mood and affect.    ED Course  Procedures (including critical care time)  DIAGNOSTIC STUDIES: Oxygen Saturation is 98% on room air, normal by my interpretation.    COORDINATION OF CARE: 6:49 PM-Discussed treatment plan which includes I&D, antibiotics, pain medication, and f/u with dentist with pt at bedside and pt agreed to plan.    INCISION AND DRAINAGE Performed by: Dierdre Forth, PA Consent: Verbal consent obtained Risks and benefits: risks, benefits and alternatives were discussed Type: abscess Body area: left upper molad Incision was made with 18-gauge needle Complexity: complex Blunt dissection to break up loculations Drainage: purulent Drainage amount: small Patient tolerance: Patient tolerated the procedure well with no immediate complications.    Labs Reviewed - No data to display No results found. 1. Dental abscess   2. Dental caries     MDM  Beth Jacobs presents with dental pain, dental infection and gross abscess on exam.  Patient with dental caries and broken tooth at the site. Gross abscess on exam.  I&D with purulent drainage.  Exam unconcerning for Ludwig's angina or spread of infection.  Will  treat with penicillin and pain medicine.  Urged patient to follow-up with oral surgeon.  I have also discussed reasons to return immediately to the ER.  Patient expresses understanding and agrees with plan.   I personally performed the services described in this documentation, which was scribed in my presence. The recorded information has been reviewed and is accurate. Dahlia Client Yuliana Vandrunen, PA-C 07/13/13 1936

## 2013-07-13 NOTE — ED Notes (Signed)
Pt reports having L upper molar pain since Monday--reports that she had root canal on that tooth years ago and felt packing come out

## 2013-07-13 NOTE — ED Notes (Signed)
Awaiting signature of provider on prescribed narcotics.

## 2013-08-17 ENCOUNTER — Emergency Department (HOSPITAL_COMMUNITY)
Admission: EM | Admit: 2013-08-17 | Discharge: 2013-08-17 | Disposition: A | Discharge status: Home / Self Care | Payer: Medicaid Other | Attending: Emergency Medicine | Admitting: Emergency Medicine

## 2013-08-17 ENCOUNTER — Emergency Department (HOSPITAL_COMMUNITY): Payer: Medicaid Other

## 2013-08-17 ENCOUNTER — Encounter (HOSPITAL_COMMUNITY): Payer: Self-pay

## 2013-08-17 DIAGNOSIS — Z91040 Latex allergy status: Secondary | ICD-10-CM | POA: Insufficient documentation

## 2013-08-17 DIAGNOSIS — M722 Plantar fascial fibromatosis: Secondary | ICD-10-CM

## 2013-08-17 DIAGNOSIS — Z792 Long term (current) use of antibiotics: Secondary | ICD-10-CM | POA: Insufficient documentation

## 2013-08-17 MED ORDER — PREDNISONE 10 MG PO TABS: 20.0000 mg | ORAL_TABLET | Freq: Every day | ORAL | Status: DC

## 2013-08-17 MED ORDER — TRAMADOL HCL 50 MG PO TABS: 50.0000 mg | ORAL_TABLET | Freq: Four times a day (QID) | ORAL | Status: DC | PRN

## 2013-08-17 MED ORDER — IBUPROFEN 800 MG PO TABS: 800.0000 mg | ORAL_TABLET | Freq: Three times a day (TID) | ORAL | Status: DC

## 2013-08-17 NOTE — ED Notes (Signed)
Pt reports Right heel pain x2 weeks that was relieved with using a tennis ball and water bottle, pt reports her symptoms returned today after being at work

## 2013-08-17 NOTE — ED Notes (Signed)
Called x1. No answer.

## 2013-08-17 NOTE — ED Provider Notes (Signed)
CSN: [id number]     Arrival date & time 08/17/13  1332 History  This chart was scribed for non-physician practitioner Fayrene Helper, PA-C  working with Gwyneth Sprout, MD by Danella Maiers, ED Scribe. This patient was seen in room TR09C/TR09C and the patient's care was started at 3:10 PM.   Chief Complaint  Patient presents with  . Foot Pain   Patient is a 42 y.o. female presenting with lower extremity pain. The history is provided by the patient. No language interpreter was used.  Foot Pain This is a recurrent problem. The current episode started more than 1 week ago. The problem has been gradually worsening. Pertinent negatives include no headaches. The symptoms are aggravated by standing. She has tried acetaminophen for the symptoms.   HPI Comments: Beth Jacobs is a 42 y.o. female who presents to the Emergency Department complaining of sharp right heel pain onset 2-3 months ago that worsened last night. She visited Urgent Care and was given exercises to do, the pain went away for three weeks, but came back last night and was much worse. She tried tylenol, ibuprofen, and exercises with no relief. She states she thinks it is a heel spur. Pain is sharp, worsening with walking, improves with rest. She denies injury, fever. She denies problems with her foot and ankle. She denies history of diabetes.  Past Medical History  Diagnosis Date  . Preterm labor   . History of allergic reaction 02/26/2013   Past Surgical History  Procedure Laterality Date  . No past surgeries     Family History  Problem Relation Age of Onset  . Other Neg Hx   . Cerebral palsy Son     born at [redacted] weeks gestation   History  Substance Use Topics  . Smoking status: Never Smoker   . Smokeless tobacco: Not on file  . Alcohol Use: No   OB History   Grav Para Term Preterm Abortions TAB SAB Ect Mult Living   6 4 3 1 2 1 1   4      Review of Systems  Neurological: Negative for headaches.  All other systems reviewed  and are negative.    Allergies  Bee venom; Latex; and Tomato  Home Medications   Current Outpatient Rx  Name  Route  Sig  Dispense  Refill  . oxyCODONE-acetaminophen (PERCOCET/ROXICET) 5-325 MG per tablet   Oral   Take 1-2 tablets by mouth every 4 (four) hours as needed for pain.   21 tablet   0   . penicillin v potassium (VEETID) 500 MG tablet   Oral   Take 1 tablet (500 mg total) by mouth 4 (four) times daily.   40 tablet   0    BP 116/73  Pulse 96  Temp(Src) 98 F (36.7 C) (Oral)  Resp 22  Ht 5\' 2"  (1.575 m)  Wt 172 lb (78.019 kg)  BMI 31.45 kg/m2  SpO2 100% Physical Exam  Nursing note and vitals reviewed. Constitutional: She appears well-developed and well-nourished.  HENT:  Head: Normocephalic and atraumatic.  Mouth/Throat: Oropharynx is clear and moist.  Eyes: EOM are normal. Pupils are equal, round, and reactive to light.  Neck: Normal range of motion. Neck supple.  Cardiovascular: Normal rate, regular rhythm and normal heart sounds.   Pulmonary/Chest: Effort normal and breath sounds normal. She has no wheezes.  Musculoskeletal: Normal range of motion.  Tenderness along the sole of the right foot, tenderness to the heel of the right foot. Plantar  flexion causes pain. Normal rom in the right ankle without tenderness. Normal flexion and tension of the knee without tenderness. No rash.  Neurological: She is alert.  Skin: Skin is warm and dry.  Psychiatric: She has a normal mood and affect. Her behavior is normal.    ED Course  Procedures (including critical care time) Medications - No data to display  DIAGNOSTIC STUDIES: Oxygen Saturation is 100% on RA, normal by my interpretation.    COORDINATION OF CARE: 3:44 PM- Discussed treatment plan with pt which includes pain meds, antiinflammatories, referral to foot specialist, and to continue with exercises. Pt agrees to plan.    Labs Review Labs Reviewed - No data to display Imaging Review Dg Foot  Complete Right  08/17/2013   CLINICAL DATA:  Heel spur.  EXAM: RIGHT FOOT COMPLETE - 3+ VIEW  COMPARISON:  None.  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 No malalignment at the Lisfranc joint.  Plantar calcaneal spur noted.  No acute bony findings.  IMPRESSION: 1. Plantar calcaneal spur. No acute findings.   Electronically Signed   By: Herbie Baltimore   On: 08/17/2013 15:29    MDM   1. Plantar fasciitis of right foot    BP 116/73  Pulse 96  Temp(Src) 98 F (36.7 C) (Oral)  Resp 22  Ht 5\' 2"  (1.575 m)  Wt 172 lb (78.019 kg)  BMI 31.45 kg/m2  SpO2 100%  LMP 08/11/2013  I have reviewed nursing notes and vital signs. I personally reviewed the imaging tests through PACS system  I reviewed available ER/hospitalization records thought the EMR   I personally performed the services described in this documentation, which was scribed in my presence. The recorded information has been reviewed and is accurate.     Fayrene Helper, PA-C 08/17/13 1605

## 2013-08-18 NOTE — ED Provider Notes (Signed)
Medical screening examination/treatment/procedure(s) were performed by non-physician practitioner and as supervising physician I was immediately available for consultation/collaboration.   Gwyneth Sprout, MD 08/18/13 1356

## 2013-08-27 ENCOUNTER — Encounter (HOSPITAL_COMMUNITY): Payer: Self-pay | Admitting: Emergency Medicine

## 2013-08-27 ENCOUNTER — Emergency Department (HOSPITAL_COMMUNITY): Payer: Medicaid Other

## 2013-08-27 ENCOUNTER — Emergency Department (HOSPITAL_COMMUNITY)
Admission: EM | Admit: 2013-08-27 | Discharge: 2013-08-27 | Disposition: A | Discharge status: Home / Self Care | Payer: Medicaid Other | Attending: Emergency Medicine | Admitting: Emergency Medicine

## 2013-08-27 DIAGNOSIS — Y929 Unspecified place or not applicable: Secondary | ICD-10-CM | POA: Insufficient documentation

## 2013-08-27 DIAGNOSIS — S59909A Unspecified injury of unspecified elbow, initial encounter: Secondary | ICD-10-CM | POA: Insufficient documentation

## 2013-08-27 DIAGNOSIS — S46909A Unspecified injury of unspecified muscle, fascia and tendon at shoulder and upper arm level, unspecified arm, initial encounter: Secondary | ICD-10-CM | POA: Insufficient documentation

## 2013-08-27 DIAGNOSIS — W108XXA Fall (on) (from) other stairs and steps, initial encounter: Secondary | ICD-10-CM | POA: Insufficient documentation

## 2013-08-27 DIAGNOSIS — S6990XA Unspecified injury of unspecified wrist, hand and finger(s), initial encounter: Secondary | ICD-10-CM | POA: Insufficient documentation

## 2013-08-27 DIAGNOSIS — S4980XA Other specified injuries of shoulder and upper arm, unspecified arm, initial encounter: Secondary | ICD-10-CM | POA: Insufficient documentation

## 2013-08-27 DIAGNOSIS — Z791 Long term (current) use of non-steroidal anti-inflammatories (NSAID): Secondary | ICD-10-CM | POA: Insufficient documentation

## 2013-08-27 DIAGNOSIS — G8929 Other chronic pain: Secondary | ICD-10-CM | POA: Insufficient documentation

## 2013-08-27 DIAGNOSIS — IMO0002 Reserved for concepts with insufficient information to code with codable children: Secondary | ICD-10-CM | POA: Insufficient documentation

## 2013-08-27 DIAGNOSIS — Y9301 Activity, walking, marching and hiking: Secondary | ICD-10-CM | POA: Insufficient documentation

## 2013-08-27 DIAGNOSIS — S39012A Strain of muscle, fascia and tendon of lower back, initial encounter: Secondary | ICD-10-CM

## 2013-08-27 DIAGNOSIS — S5010XA Contusion of unspecified forearm, initial encounter: Secondary | ICD-10-CM | POA: Insufficient documentation

## 2013-08-27 DIAGNOSIS — S40029A Contusion of unspecified upper arm, initial encounter: Secondary | ICD-10-CM | POA: Insufficient documentation

## 2013-08-27 DIAGNOSIS — Z91040 Latex allergy status: Secondary | ICD-10-CM | POA: Insufficient documentation

## 2013-08-27 DIAGNOSIS — S335XXA Sprain of ligaments of lumbar spine, initial encounter: Secondary | ICD-10-CM | POA: Insufficient documentation

## 2013-08-27 DIAGNOSIS — Z79899 Other long term (current) drug therapy: Secondary | ICD-10-CM | POA: Insufficient documentation

## 2013-08-27 MED ORDER — HYDROCODONE-ACETAMINOPHEN 5-325 MG PO TABS: 1.0000 | ORAL_TABLET | ORAL | Status: DC | PRN

## 2013-08-27 MED ORDER — ORPHENADRINE CITRATE ER 100 MG PO TB12: 100.0000 mg | ORAL_TABLET | Freq: Two times a day (BID) | ORAL | Status: DC

## 2013-08-27 MED ORDER — ORPHENADRINE CITRATE ER 100 MG PO TB12
100.0000 mg | ORAL_TABLET | Freq: Two times a day (BID) | ORAL | Status: DC
  Administered 2013-08-27: 100 mg via ORAL
  Filled 2013-08-27: qty 1

## 2013-08-27 MED ORDER — HYDROCODONE-ACETAMINOPHEN 5-325 MG PO TABS
1.0000 | ORAL_TABLET | Freq: Once | ORAL | Status: AC
  Administered 2013-08-27: 1 via ORAL
  Filled 2013-08-27: qty 1

## 2013-08-27 NOTE — ED Notes (Signed)
Pt c/o pain lower back and arms. Bruising noted to both upper arms, left greater than right.

## 2013-08-27 NOTE — ED Notes (Signed)
Pt sts moving furniture on Friday and fell being pinned with furniture; pt sts pain in lower back and back of arms

## 2013-08-27 NOTE — ED Provider Notes (Signed)
CSN: [id number]     Arrival date & time 08/27/13  1342 History   This chart was scribed for non-physician practitioner Cherrie Distance, PA-C working with Vida Roller, MD by Valera Castle, ED scribe. This patient was seen in room TR10C/TR10C and the patient's care was started at 3:17 PM.    Chief Complaint  Patient presents with  . Fall  . Back Pain    The history is provided by the patient. No language interpreter was used.   HPI Comments: Beth Jacobs is a 42 y.o. female who presents to the Emergency Department complaining of sudden, moderate, constant lower back pain, onset 4 days ago when she fell while carrying a crib walking down stairs. She also reports associated pain to the back of her bilateral arms. She reports that the pain was not so bad initially, but that since the incident the pain has increased. She reports that she is able to ambulate. She denies hitting her head or LOC. She reports taking Tramadol for the pain PTA, with no relief. She reports having the Tramadol for a spur in her foot. She states they were moving furniture, because that they had to find a new home for her son's condition. She denies any abdominal pain, chest pain, bladder or bowel incontinence, neck pain, weakness, numbness, and any other associated symptoms. She denies smoking, and EtOH use. She has no known allergies. She denies any pertinent medical history.    Past Medical History  Diagnosis Date  . Preterm labor   . History of allergic reaction 02/26/2013   Past Surgical History  Procedure Laterality Date  . No past surgeries     Family History  Problem Relation Age of Onset  . Other Neg Hx   . Cerebral palsy Son     born at [redacted] weeks gestation   History  Substance Use Topics  . Smoking status: Never Smoker   . Smokeless tobacco: Not on file  . Alcohol Use: No   OB History   Grav Para Term Preterm Abortions TAB SAB Ect Mult Living   6 4 3 1 2 1 1   4      Review of Systems  HENT:  Negative for neck pain.   Genitourinary:       Bladder or bowel incontinence.   Musculoskeletal:       Pain to the back of bilateral arms.  Neurological: Negative for syncope.  All other systems reviewed and are negative.    Allergies  Bee venom; Latex; and Tomato  Home Medications   Current Outpatient Rx  Name  Route  Sig  Dispense  Refill  . ibuprofen (ADVIL,MOTRIN) 800 MG tablet   Oral   Take 1 tablet (800 mg total) by mouth 3 (three) times daily.   21 tablet   0   . Iron TABS   Oral   Take 1 tablet by mouth daily.         . predniSONE (DELTASONE) 10 MG tablet   Oral   Take 2 tablets (20 mg total) by mouth daily.   15 tablet   0    Triage Vitals: BP 131/76  Pulse 88  Temp(Src) 98.2 F (36.8 C) (Oral)  Resp 20  Ht 5\' 2"  (1.575 m)  Wt 204 lb 3 oz (92.619 kg)  BMI 37.34 kg/m2  SpO2 100%  LMP 08/11/2013  Physical Exam  Nursing note and vitals reviewed. Constitutional: She is oriented to person, place, and time. She appears well-developed  and well-nourished. No distress.  HENT:  Head: Normocephalic and atraumatic.  Eyes: EOM are normal.  Neck: Neck supple. No tracheal deviation present.  Cardiovascular: Normal rate.   Pulmonary/Chest: Effort normal. No respiratory distress.  Musculoskeletal: Normal range of motion.  Mild midline tenderness to palpation at L4-5, so SI joint tenderness, full ROM, sensation and strength intact.  5/5 hip flexors, 5/5 quads, 5/5 hamstrings  Neurological: She is alert and oriented to person, place, and time.  Skin: Skin is warm and dry.  Superficial bruising noted to bilateral forearms, and left upper arm.  Psychiatric: She has a normal mood and affect. Her behavior is normal.    ED Course  Procedures (including critical care time)  DIAGNOSTIC STUDIES: Oxygen Saturation is 100% on room air, normal by my interpretation.    COORDINATION OF CARE: 3:22 PM-Discussed treatment plan which includes a back x-ray with pt at  bedside and pt agreed to plan.     Labs Review Labs Reviewed - No data to display Imaging Review No results found.  No orders of the defined types were placed in this encounter.   Results for orders placed during the hospital encounter of 02/25/13  CBC WITH DIFFERENTIAL      Result Value Range   WBC 11.9 (*) 4.0 - 10.5 K/uL   RBC 4.04  3.87 - 5.11 MIL/uL   Hemoglobin 11.9 (*) 12.0 - 15.0 g/dL   HCT 16.1  09.6 - 04.5 %   MCV 90.1  78.0 - 100.0 fL   MCH 29.5  26.0 - 34.0 pg   MCHC 32.7  30.0 - 36.0 g/dL   RDW 40.9  81.1 - 91.4 %   Platelets 421 (*) 150 - 400 K/uL   Neutrophils Relative % 73  43 - 77 %   Neutro Abs 8.7 (*) 1.7 - 7.7 K/uL   Lymphocytes Relative 18  12 - 46 %   Lymphs Abs 2.2  0.7 - 4.0 K/uL   Monocytes Relative 8  3 - 12 %   Monocytes Absolute 0.9  0.1 - 1.0 K/uL   Eosinophils Relative 1  0 - 5 %   Eosinophils Absolute 0.1  0.0 - 0.7 K/uL   Basophils Relative 0  0 - 1 %   Basophils Absolute 0.0  0.0 - 0.1 K/uL  COMPREHENSIVE METABOLIC PANEL      Result Value Range   Sodium 134 (*) 135 - 145 mEq/L   Potassium 4.1  3.5 - 5.1 mEq/L   Chloride 102  96 - 112 mEq/L   CO2 25  19 - 32 mEq/L   Glucose, Bld 97  70 - 99 mg/dL   BUN 9  6 - 23 mg/dL   Creatinine, Ser 7.82  0.50 - 1.10 mg/dL   Calcium 9.0  8.4 - 95.6 mg/dL   Total Protein 7.6  6.0 - 8.3 g/dL   Albumin 3.3 (*) 3.5 - 5.2 g/dL   AST 15  0 - 37 U/L   ALT 16  0 - 35 U/L   Alkaline Phosphatase 86  39 - 117 U/L   Total Bilirubin 0.2 (*) 0.3 - 1.2 mg/dL   GFR calc non Af Amer 73 (*) >90 mL/min   GFR calc Af Amer 85 (*) >90 mL/min  HCG, SERUM, QUALITATIVE      Result Value Range   Preg, Serum NEGATIVE  NEGATIVE  CBC      Result Value Range   WBC 9.0  4.0 - 10.5 K/uL  RBC 3.68 (*) 3.87 - 5.11 MIL/uL   Hemoglobin 10.9 (*) 12.0 - 15.0 g/dL   HCT 16.1 (*) 09.6 - 04.5 %   MCV 90.5  78.0 - 100.0 fL   MCH 29.6  26.0 - 34.0 pg   MCHC 32.7  30.0 - 36.0 g/dL   RDW 40.9  81.1 - 91.4 %   Platelets 410 (*)  150 - 400 K/uL  FERRITIN      Result Value Range   Ferritin 35  10 - 291 ng/mL  IRON AND TIBC      Result Value Range   Iron 29 (*) 42 - 135 ug/dL   TIBC 782  956 - 213 ug/dL   Saturation Ratios 11 (*) 20 - 55 %   UIBC 232  125 - 400 ug/dL  VITAMIN Y86      Result Value Range   Vitamin B-12 329  211 - 911 pg/mL  BASIC METABOLIC PANEL      Result Value Range   Sodium 134 (*) 135 - 145 mEq/L   Potassium 3.7  3.5 - 5.1 mEq/L   Chloride 101  96 - 112 mEq/L   CO2 28  19 - 32 mEq/L   Glucose, Bld 102 (*) 70 - 99 mg/dL   BUN 10  6 - 23 mg/dL   Creatinine, Ser 5.78  0.50 - 1.10 mg/dL   Calcium 9.1  8.4 - 46.9 mg/dL   GFR calc non Af Amer 81 (*) >90 mL/min   GFR calc Af Amer >90  >90 mL/min  CBC      Result Value Range   WBC 5.4  4.0 - 10.5 K/uL   RBC 3.78 (*) 3.87 - 5.11 MIL/uL   Hemoglobin 11.0 (*) 12.0 - 15.0 g/dL   HCT 62.9 (*) 52.8 - 41.3 %   MCV 90.2  78.0 - 100.0 fL   MCH 29.1  26.0 - 34.0 pg   MCHC 32.3  30.0 - 36.0 g/dL   RDW 24.4  01.0 - 27.2 %   Platelets 411 (*) 150 - 400 K/uL   Dg Lumbar Spine Complete  08/27/2013   CLINICAL DATA:  Low back pain  EXAM: LUMBAR SPINE - COMPLETE 4+ VIEW  COMPARISON:  None.  FINDINGS: There is no evidence of lumbar spine fracture. Alignment is normal. Intervertebral disc spaces are maintained. Partial lumbarization of the S1 level.  IMPRESSION: Negative.   Electronically Signed   By: Christiana Pellant M.D.   On: 08/27/2013 16:09   Dg Foot Complete Right  08/17/2013   CLINICAL DATA:  Heel spur.  EXAM: RIGHT FOOT COMPLETE - 3+ VIEW  COMPARISON:  None.  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 No malalignment at the Lisfranc joint.  Plantar calcaneal spur noted.  No acute bony findings.  IMPRESSION: 1. Plantar calcaneal spur. No acute findings.   Electronically Signed   By: Herbie Baltimore   On: 08/17/2013 15:29      MDM  Lumbar strain  Patient with history of chronic right foot pain here with lower back pain s/p fall -  there are no alarming signs to suggest fracture, epidural hematoma, cauda equina.  Will give short course pain medication and muscle relaxer.  I personally performed the services described in this documentation, which was scribed in my presence. The recorded information has been reviewed and is accurate.    Izola Price Marisue Humble, PA-C 08/27/13 1626

## 2013-08-27 NOTE — ED Notes (Signed)
Patient transported to X-ray 

## 2013-08-28 NOTE — ED Provider Notes (Signed)
Medical screening examination/treatment/procedure(s) were performed by non-physician practitioner and as supervising physician I was immediately available for consultation/collaboration.    Ainslie Mazurek D Lillyn Wieczorek, MD 08/28/13 0010 

## 2013-09-03 ENCOUNTER — Encounter (HOSPITAL_COMMUNITY): Payer: Self-pay | Admitting: Emergency Medicine

## 2013-09-03 ENCOUNTER — Emergency Department (HOSPITAL_COMMUNITY)
Admission: EM | Admit: 2013-09-03 | Discharge: 2013-09-03 | Disposition: A | Discharge status: Home / Self Care | Payer: Medicaid Other | Attending: Emergency Medicine | Admitting: Emergency Medicine

## 2013-09-03 ENCOUNTER — Emergency Department (HOSPITAL_COMMUNITY): Payer: Medicaid Other

## 2013-09-03 DIAGNOSIS — Y929 Unspecified place or not applicable: Secondary | ICD-10-CM | POA: Insufficient documentation

## 2013-09-03 DIAGNOSIS — S93401A Sprain of unspecified ligament of right ankle, initial encounter: Secondary | ICD-10-CM

## 2013-09-03 DIAGNOSIS — Z79899 Other long term (current) drug therapy: Secondary | ICD-10-CM | POA: Insufficient documentation

## 2013-09-03 DIAGNOSIS — Y939 Activity, unspecified: Secondary | ICD-10-CM | POA: Insufficient documentation

## 2013-09-03 DIAGNOSIS — Z91040 Latex allergy status: Secondary | ICD-10-CM | POA: Insufficient documentation

## 2013-09-03 DIAGNOSIS — S93499A Sprain of other ligament of unspecified ankle, initial encounter: Secondary | ICD-10-CM | POA: Insufficient documentation

## 2013-09-03 DIAGNOSIS — X500XXA Overexertion from strenuous movement or load, initial encounter: Secondary | ICD-10-CM | POA: Insufficient documentation

## 2013-09-03 DIAGNOSIS — S86001A Unspecified injury of right Achilles tendon, initial encounter: Secondary | ICD-10-CM

## 2013-09-03 MED ORDER — NAPROXEN 250 MG PO TABS
500.0000 mg | ORAL_TABLET | Freq: Once | ORAL | Status: AC
  Administered 2013-09-03: 500 mg via ORAL
  Filled 2013-09-03: qty 2

## 2013-09-03 MED ORDER — HYDROCODONE-ACETAMINOPHEN 5-325 MG PO TABS
1.0000 | ORAL_TABLET | Freq: Once | ORAL | Status: AC
  Administered 2013-09-03: 1 via ORAL
  Filled 2013-09-03: qty 1

## 2013-09-03 MED ORDER — HYDROCODONE-ACETAMINOPHEN 5-325 MG PO TABS: ORAL_TABLET | ORAL | Status: DC

## 2013-09-03 MED ORDER — NAPROXEN 500 MG PO TABS: 500.0000 mg | ORAL_TABLET | Freq: Two times a day (BID) | ORAL | Status: DC

## 2013-09-03 NOTE — ED Notes (Addendum)
Patient states her ankle injury started last Wed.  Has a heel spur, purchased a brace and was wearing it.  When she took the brace off noticed her heel was swollen and discolored.  States last Wed her right heel got caught in the broken cement at her apartment.

## 2013-09-03 NOTE — ED Notes (Signed)
Pt back from X-ray.  

## 2013-09-03 NOTE — Progress Notes (Signed)
Orthopedic Tech Progress Note Patient Details:  Beth Jacobs 09/06/1971 [id number]  Ortho Devices Type of Ortho Device: ASO;Crutches Ortho Device/Splint Location: RLE Ortho Device/Splint Interventions: Ordered;Application   Yesica Kemler Craig 09/03/2013, 5:38 PM  

## 2013-09-03 NOTE — ED Provider Notes (Signed)
CSN: [id number]     Arrival date & time 09/03/13  1625 History  This chart was scribed for non-physician practitioner, Rhea Bleacher, PA-C working with Junius Argyle, MD by Greggory Stallion, ED scribe. This patient was seen in room TR06C/TR06C and the patient's care was started at 5:09 PM.   Chief Complaint  Patient presents with  . Ankle Pain   The history is provided by the patient. No language interpreter was used.   HPI Comments: Beth Jacobs is a 42 y.o. female who presents to the Emergency Department complaining of right ankle injury that occurred 5 days ago. She states her right heel got caught in broken cement and she twisted her ankle. Pt has sudden onset, constant right ankle pain with associated mild swelling. She states she also has a lot of right calf pain. Pt states she wrapped her ankle with an ACE wrap, used an ankle brace and taken tramadol with no relief. Bearing weight worsens the pain.   Past Medical History  Diagnosis Date  . Preterm labor   . History of allergic reaction 02/26/2013   Past Surgical History  Procedure Laterality Date  . No past surgeries     Family History  Problem Relation Age of Onset  . Other Neg Hx   . Cerebral palsy Son     born at [redacted] weeks gestation   History  Substance Use Topics  . Smoking status: Never Smoker   . Smokeless tobacco: Not on file  . Alcohol Use: No   OB History   Grav Para Term Preterm Abortions TAB SAB Ect Mult Living   6 4 3 1 2 1 1   4      Review of Systems  Constitutional: Negative for activity change.  Musculoskeletal: Positive for arthralgias, joint swelling and myalgias. Negative for back pain and neck pain.  Skin: Negative for wound.  Neurological: Negative for weakness and numbness.    Allergies  Bee venom; Latex; and Tomato  Home Medications   Current Outpatient Rx  Name  Route  Sig  Dispense  Refill  . Iron TABS   Oral   Take 1 tablet by mouth daily.         . traMADol (ULTRAM) 50 MG  tablet   Oral   Take 50 mg by mouth every 6 (six) hours as needed for pain.         Marland Kitchen HYDROcodone-acetaminophen (NORCO/VICODIN) 5-325 MG per tablet      Take 1-2 tablets every 6 hours as needed for severe pain   10 tablet   0   . naproxen (NAPROSYN) 500 MG tablet   Oral   Take 1 tablet (500 mg total) by mouth 2 (two) times daily.   20 tablet   0    BP 107/70  Pulse 70  Temp(Src) 97.9 F (36.6 C) (Oral)  Resp 20  Ht 5\' 2"  (1.575 m)  Wt 204 lb (92.534 kg)  BMI 37.3 kg/m2  SpO2 97%  LMP 08/11/2013  Breastfeeding? No  Physical Exam  Nursing note and vitals reviewed. Constitutional: She appears well-developed and well-nourished.  HENT:  Head: Normocephalic and atraumatic.  Eyes: Conjunctivae are normal.  Neck: Normal range of motion. Neck supple.  Cardiovascular:  Pulses:      Dorsalis pedis pulses are 2+ on the right side, and 2+ on the left side.       Posterior tibial pulses are 2+ on the right side, and 2+ on the left side.  Musculoskeletal: She exhibits edema and tenderness.       Right knee: Normal.       Right ankle: She exhibits decreased range of motion and swelling. No tenderness. Achilles tendon exhibits pain. Achilles tendon exhibits no defect and normal Thompson's test results.       Right lower leg: She exhibits tenderness. She exhibits no bony tenderness and no swelling.       Right foot: She exhibits decreased range of motion and swelling. She exhibits no tenderness and no bony tenderness.  She denies pain with palpation over the fibular head of the affected side. She denies pain in the hip of the affected side.  Neurological: She is alert.  Distal motor, sensation, and vascular intact.   Skin: Skin is warm and dry.  Psychiatric: She has a normal mood and affect.    ED Course  Procedures (including critical care time)  DIAGNOSTIC STUDIES: Oxygen Saturation is 97% on RA, normal by my interpretation.    COORDINATION OF CARE: 5:15 PM-Discussed  treatment plan which includes pain medication, crutches, and continued use of her ankle brace with pt at bedside and pt agreed to plan. Advised pt to follow up with orthopedics.   Labs Review Labs Reviewed - No data to display Imaging Review Dg Ankle Complete Right  09/03/2013   CLINICAL DATA:  Fall. Pain in right ankle  EXAM: RIGHT ANKLE - COMPLETE 3+ VIEW  COMPARISON:  None.  FINDINGS: There is no evidence of fracture, dislocation, or joint effusion. There is no evidence of arthropathy or other focal bone abnormality. Soft tissues are unremarkable.  IMPRESSION: Negative.   Electronically Signed   By: Signa Kell M.D.   On: 09/03/2013 17:05    EKG Interpretation   None      Vital signs reviewed and are as follows: Filed Vitals:   09/03/13 1747  BP: 118/68  Pulse: 82  Temp: 98 F (36.7 C)  Resp: 17   Crutches and ASO by orthopedic tech. Urged followup with orthopedist.  Patient counseled on use of narcotic pain medications. Counseled not to combine these medications with others containing tylenol. Urged not to drink alcohol, drive, or perform any other activities that requires focus while taking these medications. The patient verbalizes understanding and agrees with the plan.     MDM   1. Ankle sprain, right, initial encounter   2. Achilles tendon injury, right, initial encounter    Patient with ankle injury, negative x-ray. She has significan however significant tenderness over her Achilles tendon however Thompson test is normal and there are no palpable defects. Patient can slightly dorsiflex and plantar flex her right foot. She may have sustained partial injury to Achilles. Feel orthopedic followup is indicated as patient is unable to ambulate well 2/2 her injury. Foot and leg are neurovascularly intact.   I personally performed the services described in this documentation, which was scribed in my presence. The recorded information has been reviewed and is  accurate.   Renne Crigler, PA-C 09/03/13 2006

## 2013-09-03 NOTE — Progress Notes (Signed)
Orthopedic Tech Progress Note Patient Details:  Beth Jacobs 02/24/1971 [id number]  Ortho Devices Type of Ortho Device: ASO;Crutches Ortho Device/Splint Location: RLE Ortho Device/Splint Interventions: Ordered;Application   Jennye Moccasin 09/03/2013, 5:38 PM

## 2013-09-04 NOTE — ED Provider Notes (Signed)
Medical screening examination/treatment/procedure(s) were performed by non-physician practitioner and as supervising physician I was immediately available for consultation/collaboration.   Moria Brophy S Traevion Poehler, MD 09/04/13 1228 

## 2013-09-28 ENCOUNTER — Emergency Department (HOSPITAL_COMMUNITY)
Admission: EM | Admit: 2013-09-28 | Discharge: 2013-09-28 | Disposition: A | Discharge status: Home / Self Care | Payer: Medicaid Other | Attending: Emergency Medicine | Admitting: Emergency Medicine

## 2013-09-28 ENCOUNTER — Encounter (HOSPITAL_COMMUNITY): Payer: Self-pay | Admitting: Emergency Medicine

## 2013-09-28 ENCOUNTER — Emergency Department (HOSPITAL_COMMUNITY): Payer: Medicaid Other

## 2013-09-28 DIAGNOSIS — Z79899 Other long term (current) drug therapy: Secondary | ICD-10-CM | POA: Insufficient documentation

## 2013-09-28 DIAGNOSIS — Z8751 Personal history of pre-term labor: Secondary | ICD-10-CM | POA: Insufficient documentation

## 2013-09-28 DIAGNOSIS — M25579 Pain in unspecified ankle and joints of unspecified foot: Secondary | ICD-10-CM | POA: Insufficient documentation

## 2013-09-28 DIAGNOSIS — S99911D Unspecified injury of right ankle, subsequent encounter: Secondary | ICD-10-CM

## 2013-09-28 DIAGNOSIS — G8911 Acute pain due to trauma: Secondary | ICD-10-CM | POA: Insufficient documentation

## 2013-09-28 DIAGNOSIS — Z91040 Latex allergy status: Secondary | ICD-10-CM | POA: Insufficient documentation

## 2013-09-28 DIAGNOSIS — R609 Edema, unspecified: Secondary | ICD-10-CM | POA: Insufficient documentation

## 2013-09-28 MED ORDER — HYDROCODONE-ACETAMINOPHEN 5-325 MG PO TABS: 2.0000 | ORAL_TABLET | ORAL | Status: DC | PRN

## 2013-09-28 MED ORDER — HYDROCODONE-ACETAMINOPHEN 5-325 MG PO TABS
2.0000 | ORAL_TABLET | Freq: Once | ORAL | Status: AC
  Administered 2013-09-28: 2 via ORAL
  Filled 2013-09-28: qty 2

## 2013-09-28 NOTE — ED Provider Notes (Signed)
CSN: [id number]     Arrival date & time 09/28/13  1459 History  This chart was scribed for non-physician practitioner, Emilia Beck, PA-C,working with Donnetta Hutching, MD, by Karle Plumber, ED Scribe.  This patient was seen in room TR11C/TR11C and the patient's care was started at 4:35 PM.  Chief Complaint  Patient presents with  . Leg Pain   Patient is a 42 y.o. female presenting with leg pain. The history is provided by the patient. No language interpreter was used.  Leg Pain Location:  Ankle Time since incident:  3 weeks Injury: yes   Mechanism of injury: fall   Fall:    Fall occurred:  Down stairs   Entrapped after fall: no   Ankle location:  R ankle Pain details:    Radiates to:  Does not radiate   Severity:  Severe   Duration:  3 weeks Dislocation: no    HPI Comments:  Beth Jacobs is a 42 y.o. female who presents to the Emergency Department complaining of a right ankle injury onset three weeks ago after falling down the stairs at her home. She states she was seen here in the ED and was told that she needed to follow up with the orthopedist for possible achilles tear, which she has not done. Pt states she has not had relief from her pain since her visit her. Pt was given an ankle brace at her last visit but states it is uncomfortable to wear. She states her ankle is tender to palpation and is unable to flex secondary to pain.   Past Medical History  Diagnosis Date  . Preterm labor   . History of allergic reaction 02/26/2013   Past Surgical History  Procedure Laterality Date  . No past surgeries     Family History  Problem Relation Age of Onset  . Other Neg Hx   . Cerebral palsy Son     born at [redacted] weeks gestation   History  Substance Use Topics  . Smoking status: Never Smoker   . Smokeless tobacco: Not on file  . Alcohol Use: No   OB History   Grav Para Term Preterm Abortions TAB SAB Ect Mult Living   6 4 3 1 2 1 1   4      Review of Systems  Musculoskeletal:        Pain and swelling to right ankle. Tender to palpation.   All other systems reviewed and are negative.    Allergies  Bee venom; Latex; and Tomato  Home Medications   Current Outpatient Rx  Name  Route  Sig  Dispense  Refill  . cyclobenzaprine (FLEXERIL) 10 MG tablet   Oral   Take 10 mg by mouth 3 (three) times daily as needed for muscle spasms.         . Iron TABS   Oral   Take 1 tablet by mouth daily.         . naproxen (NAPROSYN) 500 MG tablet   Oral   Take 1 tablet (500 mg total) by mouth 2 (two) times daily.   20 tablet   0   . oxyCODONE-acetaminophen (PERCOCET/ROXICET) 5-325 MG per tablet   Oral   Take 1 tablet by mouth every 4 (four) hours as needed for severe pain.          Triage Vitals: BP 113/52  Pulse 78  Temp(Src) 97.7 F (36.5 C) (Oral)  Resp 19  Wt 198 lb 7 oz (90.011 kg)  SpO2 100%  LMP 09/14/2013 Physical Exam  Nursing note and vitals reviewed. Constitutional: She is oriented to person, place, and time. She appears well-developed and well-nourished. No distress.  HENT:  Head: Normocephalic and atraumatic.  Eyes: Conjunctivae are normal. No scleral icterus.  Neck: Neck supple.  Cardiovascular: Normal rate and intact distal pulses.   Pulmonary/Chest: Effort normal. No stridor. No respiratory distress.  Abdominal: Normal appearance. She exhibits no distension.  Musculoskeletal: She exhibits edema and tenderness.  Posterior right ankle tender to palpation. Mild edema noted of right ankle. No obvious deformity. ROM limited due to pain.  Neurological: She is alert and oriented to person, place, and time.  Skin: Skin is warm and dry. No rash noted.  Psychiatric: She has a normal mood and affect. Her behavior is normal.    ED Course  Procedures (including critical care time) DIAGNOSTIC STUDIES: Oxygen Saturation is 100% on RA, normal by my interpretation.   COORDINATION OF CARE: 4:38 PM- Advised pt to apply hot compresses and elevate  ankle. Will give pt medication for pain and provide an orthopedic referral. Pt verbalizes understanding and agrees to plan.  Medications - No data to display  Labs Review Labs Reviewed - No data to display Imaging Review Dg Ankle Complete Right  09/28/2013   CLINICAL DATA:  Right ankle injury, posterior pain.  EXAM: RIGHT ANKLE - COMPLETE 3+ VIEW  COMPARISON:  None.  FINDINGS: Right ankle demonstrates no fracture or dislocation. The ankle mortise is intact. There is a tiny plantar calcaneal spur. The soft tissues are normal.  IMPRESSION: No acute osseous injury of the right ankle.   Electronically Signed   By: Elige Ko   On: 09/28/2013 16:08    EKG Interpretation   None       MDM   1. Right ankle injury, subsequent encounter    Patient referred to Orthopedic for further evaluation. Xray unremarkable for acute changes. Patient will be discharged with pain medication. No neurovascular compromise.   I personally performed the services described in this documentation, which was scribed in my presence. The recorded information has been reviewed and is accurate.    Emilia Beck, PA-C 09/28/13 2327

## 2013-09-28 NOTE — ED Notes (Signed)
One month ago injured right ankle by falling off steps at home, seen here and DX with achilles tear, pain is not better and has been unable to foollow up with ortho. Right ankle with mild swelling, using crutches for support. CMS intact.

## 2013-09-28 NOTE — ED Notes (Signed)
Pt given d/c instructions and verbalized understanding. NAD at this time.  

## 2013-10-02 NOTE — ED Provider Notes (Signed)
Medical screening examination/treatment/procedure(s) were performed by non-physician practitioner and as supervising physician I was immediately available for consultation/collaboration.  EKG Interpretation   None        Milea Klink, MD 10/02/13 1552 

## 2013-11-05 ENCOUNTER — Encounter (HOSPITAL_COMMUNITY): Payer: Self-pay | Admitting: Emergency Medicine

## 2013-11-05 ENCOUNTER — Emergency Department (HOSPITAL_COMMUNITY)
Admission: EM | Admit: 2013-11-05 | Discharge: 2013-11-05 | Disposition: A | Discharge status: Home / Self Care | Payer: Medicaid Other | Attending: Emergency Medicine | Admitting: Emergency Medicine

## 2013-11-05 ENCOUNTER — Emergency Department (HOSPITAL_COMMUNITY): Payer: Medicaid Other

## 2013-11-05 DIAGNOSIS — Z91040 Latex allergy status: Secondary | ICD-10-CM | POA: Insufficient documentation

## 2013-11-05 DIAGNOSIS — W010XXA Fall on same level from slipping, tripping and stumbling without subsequent striking against object, initial encounter: Secondary | ICD-10-CM | POA: Insufficient documentation

## 2013-11-05 DIAGNOSIS — Y9289 Other specified places as the place of occurrence of the external cause: Secondary | ICD-10-CM | POA: Insufficient documentation

## 2013-11-05 DIAGNOSIS — S93499A Sprain of other ligament of unspecified ankle, initial encounter: Secondary | ICD-10-CM | POA: Insufficient documentation

## 2013-11-05 DIAGNOSIS — W19XXXA Unspecified fall, initial encounter: Secondary | ICD-10-CM

## 2013-11-05 DIAGNOSIS — Z79899 Other long term (current) drug therapy: Secondary | ICD-10-CM | POA: Insufficient documentation

## 2013-11-05 DIAGNOSIS — Y9301 Activity, walking, marching and hiking: Secondary | ICD-10-CM | POA: Insufficient documentation

## 2013-11-05 DIAGNOSIS — S86011A Strain of right Achilles tendon, initial encounter: Secondary | ICD-10-CM

## 2013-11-05 MED ORDER — HYDROCODONE-ACETAMINOPHEN 5-325 MG PO TABS
2.0000 | ORAL_TABLET | Freq: Once | ORAL | Status: AC
  Administered 2013-11-05: 2 via ORAL
  Filled 2013-11-05: qty 2

## 2013-11-05 MED ORDER — MELOXICAM 15 MG PO TABS: 15.0000 mg | ORAL_TABLET | Freq: Every day | ORAL | Status: DC

## 2013-11-05 MED ORDER — HYDROCODONE-ACETAMINOPHEN 5-325 MG PO TABS: 1.0000 | ORAL_TABLET | ORAL | Status: DC | PRN

## 2013-11-05 MED ORDER — ONDANSETRON 4 MG PO TBDP
4.0000 mg | ORAL_TABLET | Freq: Once | ORAL | Status: AC
  Administered 2013-11-05: 4 mg via ORAL
  Filled 2013-11-05: qty 1

## 2013-11-05 NOTE — ED Provider Notes (Signed)
CSN: [id number]     Arrival date & time 11/05/13  1914 History  This chart was scribed for non-physician practitioner Ivonne Andrew, PA-C working with Dagmar Hait, MD by Landis Gandy, ED Scribe. This patient was seen in room WTR9/WTR9 and the patient's care was started at 1834.     Chief Complaint  Patient presents with  . Leg Pain    The history is provided by the patient. No language interpreter was used.   HPI Comments: Beth Jacobs is a 42 y.o. female who presents to the Emergency Department complaining of a fall that resulted in right ankle injury. She states that she was walking in her backyard when she slipped on leaves, twisting her right ankle.  Pt has sudden onset, constant, unchanged right posterior ankle pain. She has associated paresthesias in the right thigh. The right ankle pain is worsened with movement. She has not taken any medications for these symptoms today. She denies any numbness or weakness in the right foot. She has a history of a suspected achilles tendon injury that happened two months ago. The patient was referred to an orthopedist butstates that he would not see her until her Medicaid card arrived. Patient was taking hydrocodone and Aleve previously for injuries with good relief of symptoms.    Past Medical History  Diagnosis Date  . Preterm labor   . History of allergic reaction 02/26/2013   Past Surgical History  Procedure Laterality Date  . No past surgeries     Family History  Problem Relation Age of Onset  . Other Neg Hx   . Cerebral palsy Son     born at [redacted] weeks gestation   History  Substance Use Topics  . Smoking status: Never Smoker   . Smokeless tobacco: Not on file  . Alcohol Use: No   OB History   Grav Para Term Preterm Abortions TAB SAB Ect Mult Living   6 4 3 1 2 1 1   4      Review of Systems  Musculoskeletal: Positive for arthralgias (right ankle).  Neurological: Negative for weakness and numbness.       Paresthesias  to right thigh.      Allergies  Bee venom; Latex; and Tomato  Home Medications   Current Outpatient Rx  Name  Route  Sig  Dispense  Refill  . Iron TABS   Oral   Take 1 tablet by mouth daily.         Marland Kitchen oxyCODONE-acetaminophen (PERCOCET/ROXICET) 5-325 MG per tablet   Oral   Take 1 tablet by mouth every 4 (four) hours as needed for severe pain.          Triage Vitals: BP 107/45  Pulse 81  Temp(Src) 98.2 F (36.8 C) (Oral)  Resp 18  SpO2 100%  LMP 10/11/2013 Physical Exam  Nursing note and vitals reviewed. Constitutional: She is oriented to person, place, and time. She appears well-developed and well-nourished. No distress.  HENT:  Head: Normocephalic and atraumatic.  Eyes: Conjunctivae and EOM are normal. No scleral icterus.  Neck: Normal range of motion.  Cardiovascular: Normal rate and intact distal pulses.   Good pulses in right lower extremity.   Pulmonary/Chest: Effort normal. No respiratory distress.  Musculoskeletal: Normal range of motion.  Diffuse tenderness around heel. No significant pain or deformity of the medial or lateral malleolus. No proximal fibular tenderness. Tenderness and swelling along achilles tendon. No mass or balling in right calf muscle.  Neurological: She is alert and oriented to person, place, and time.  Skin: Skin is warm and dry. No rash noted. She is not diaphoretic. No erythema. No pallor.  Dry cracking skin around the heel. No obvious bruising.     Psychiatric: She has a normal mood and affect. Her behavior is normal.    ED Course  Procedures   DIAGNOSTIC STUDIES: Oxygen Saturation is 100% on RA, normal by my interpretation.    COORDINATION OF CARE: 8:04 PM-  Pt advised of plan for treatment and pt agrees.  8:31 PM -Attending physician was consulted, who also evaluated patient. He suggests ordering an MRI.   MRI demonstrates partial tears throughout the acuities. No complete tear or rupture. Will place patient in  short posterior splint for immobilization and slight plantar flexion. Crutches provided for nonweightbearing. Orthopedic referral provided.  Imaging Review Dg Ankle Complete Right  11/05/2013   CLINICAL DATA:  Pain  EXAM: RIGHT ANKLE - COMPLETE 3+ VIEW  COMPARISON:  September 28, 2013  FINDINGS: Frontal, oblique, and lateral views were obtained. No fracture or effusion. Ankle mortise appears intact. There is a minimal spur arising from the inferior calcaneus.  IMPRESSION: No fracture.  Mortise intact.  No appreciable arthropathy.   Electronically Signed   By: Bretta Bang M.D.   On: 11/05/2013 20:18   Mr Ankle Right  Wo Contrast  11/05/2013   CLINICAL DATA:  Rule out Achilles rupture or injury.  EXAM: MRI OF THE RIGHT ANKLE WITHOUT CONTRAST  TECHNIQUE: Multiplanar, multisequence MR imaging of the ankle was performed. No intravenous contrast was administered.  COMPARISON:  None.  FINDINGS: TENDONS  Peroneal: Intact.  Posteromedial: Intact.  Anterior: Intact.  Achilles: Markedly thickened and rounded in appearance, measuring up to 16 mm AP. Fluid signal intensity present within the midportion of the tendon, consistent with partial tears. The fibers at the calcaneal attachment are intact and appear normal. Mild paratendon edematous change.  Plantar Fascia: Unremarkable  LIGAMENTS  Lateral: Intact  Medial: Intact  CARTILAGE  Ankle Joint: Smooth.  Subtalar Joints/Sinus Tarsi: No fat infiltration.  Bones: No fracture or marrow edema.  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   Electronically Signed   By: Tiburcio Pea M.D.   On: 11/05/2013 21:51     MDM   1. Achilles tendon tear, right, initial encounter   2. Fall, initial encounter     I personally performed the services described in this documentation, which was scribed in my presence. The recorded information has been reviewed and is accurate.   Angus Seller, PA-C 11/05/13 2233

## 2013-11-05 NOTE — ED Notes (Signed)
Ortho tech paged  

## 2013-11-05 NOTE — ED Provider Notes (Signed)
Medical screening examination/treatment/procedure(s) were conducted as a shared visit with non-physician practitioner(s) and myself.  I personally evaluated the patient during the encounter.  EKG Interpretation   None        Patient here with R ankle pain/injury. Patient seen by PCP previously for possible achilles injury. Acutely injured it today. R ankle with swelling posterior to malleoli, Thompson test with mild raising of foot. MRI shows partial tendon rupture. Immobilized, Ortho referral.  Dagmar Hait, MD 11/05/13 [phone]

## 2013-11-05 NOTE — ED Notes (Signed)
Patient transported to MRI 

## 2013-11-05 NOTE — ED Notes (Signed)
Ortho Tech at bedside.  

## 2013-11-05 NOTE — ED Notes (Addendum)
Per EMS, Pt, from home, c/o RLE pain after "slipping on some leaves" this afternoon.  Pain score 10/10.  Pt reports to EMS that she "tore her Achilles' tendon a month ago and the doctor will not do surgery."  Pt reports to this RN that "tear" happen two weeks ago.  Sts "the surgeon will not do surgery, because I do not have insurance."  Pt is not wearing brace that was provided at 10/3 visit.

## 2013-11-05 NOTE — ED Notes (Signed)
PT returned from MRI.

## 2013-12-12 ENCOUNTER — Emergency Department (HOSPITAL_COMMUNITY)
Admission: EM | Admit: 2013-12-12 | Discharge: 2013-12-12 | Disposition: A | Discharge status: Home / Self Care | Payer: Medicaid Other | Attending: Emergency Medicine | Admitting: Emergency Medicine

## 2013-12-12 ENCOUNTER — Encounter (HOSPITAL_COMMUNITY): Payer: Self-pay | Admitting: Emergency Medicine

## 2013-12-12 DIAGNOSIS — S86019A Strain of unspecified Achilles tendon, initial encounter: Secondary | ICD-10-CM

## 2013-12-12 DIAGNOSIS — Z79899 Other long term (current) drug therapy: Secondary | ICD-10-CM | POA: Insufficient documentation

## 2013-12-12 DIAGNOSIS — Y929 Unspecified place or not applicable: Secondary | ICD-10-CM | POA: Insufficient documentation

## 2013-12-12 DIAGNOSIS — Z791 Long term (current) use of non-steroidal anti-inflammatories (NSAID): Secondary | ICD-10-CM | POA: Insufficient documentation

## 2013-12-12 DIAGNOSIS — S96819A Strain of other specified muscles and tendons at ankle and foot level, unspecified foot, initial encounter: Principal | ICD-10-CM

## 2013-12-12 DIAGNOSIS — Z91040 Latex allergy status: Secondary | ICD-10-CM | POA: Insufficient documentation

## 2013-12-12 DIAGNOSIS — S93499A Sprain of other ligament of unspecified ankle, initial encounter: Secondary | ICD-10-CM | POA: Insufficient documentation

## 2013-12-12 DIAGNOSIS — Y939 Activity, unspecified: Secondary | ICD-10-CM | POA: Insufficient documentation

## 2013-12-12 DIAGNOSIS — X58XXXA Exposure to other specified factors, initial encounter: Secondary | ICD-10-CM | POA: Insufficient documentation

## 2013-12-12 MED ORDER — OXYCODONE-ACETAMINOPHEN 5-325 MG PO TABS: 1.0000 | ORAL_TABLET | ORAL | Status: DC | PRN

## 2013-12-12 MED ORDER — OXYCODONE-ACETAMINOPHEN 5-325 MG PO TABS
2.0000 | ORAL_TABLET | Freq: Once | ORAL | Status: AC
  Administered 2013-12-12: 2 via ORAL
  Filled 2013-12-12: qty 2

## 2013-12-12 MED ORDER — HYDROCODONE-ACETAMINOPHEN 5-325 MG PO TABS: 2.0000 | ORAL_TABLET | Freq: Once | ORAL | Status: DC

## 2013-12-12 NOTE — ED Notes (Signed)
Pt has a ride home.  

## 2013-12-12 NOTE — ED Notes (Signed)
Pt states tore right achilles tendon months ago; splint fell apart; presents with continued right ankle pain

## 2013-12-12 NOTE — Discharge Instructions (Signed)
Follow up with Orthopedic doctor for further evaluation and management of your achilles tendon tear. Take Percocet as needed for pain.

## 2013-12-12 NOTE — ED Provider Notes (Signed)
CSN: [id number]     Arrival date & time 12/12/13  1451 History  This chart was scribed for non-physician practitioner Alvina Chou, PA-C working with Alfonzo Feller, DO by Ludger Nutting, ED Scribe. This patient was seen in room WTR5/WTR5 and the patient's care was started at 1451.    Chief Complaint  Patient presents with  . Ankle Pain    The history is provided by the patient. No language interpreter was used.    HPI Comments: Beth Jacobs is a 43 y.o. female who presents to the Emergency Department complaining of constant, gradually worsened right ankle pain that began 3 days ago but initially began over 1 month ago. Pt states she has been seen in the ED for this pain on 11/05/13 and was referred to an orthopedist at that time. She states she was unable to make an appointment with orthopedics because she lost her medicaid card. She denies any knew injuries or trauma to the affected area since the initially injury. She denies numbness or weakness.   Past Medical History  Diagnosis Date  . Preterm labor   . History of allergic reaction 02/26/2013   Past Surgical History  Procedure Laterality Date  . No past surgeries     Family History  Problem Relation Age of Onset  . Other Neg Hx   . Cerebral palsy Son     born at [redacted] weeks gestation   History  Substance Use Topics  . Smoking status: Never Smoker   . Smokeless tobacco: Not on file  . Alcohol Use: No   OB History   Grav Para Term Preterm Abortions TAB SAB Ect Mult Living   6 4 3 1 2 1 1   4      Review of Systems  Constitutional: Negative for fever, chills and fatigue.  HENT: Negative for trouble swallowing.   Eyes: Negative for visual disturbance.  Respiratory: Negative for shortness of breath.   Cardiovascular: Negative for chest pain and palpitations.  Gastrointestinal: Negative for nausea, vomiting, abdominal pain and diarrhea.  Genitourinary: Negative for dysuria and difficulty urinating.  Musculoskeletal:  Positive for arthralgias (right ankle pain). Negative for neck pain.  Skin: Negative for color change.  Neurological: Negative for dizziness, weakness and numbness.  Psychiatric/Behavioral: Negative for dysphoric mood.    Allergies  Bee venom; Latex; and Tomato  Home Medications   Current Outpatient Rx  Name  Route  Sig  Dispense  Refill  . HYDROcodone-acetaminophen (NORCO/VICODIN) 5-325 MG per tablet   Oral   Take 1 tablet by mouth every 4 (four) hours as needed for moderate pain.   10 tablet   0   . Iron TABS   Oral   Take 1 tablet by mouth daily.         . meloxicam (MOBIC) 15 MG tablet   Oral   Take 1 tablet (15 mg total) by mouth daily.   30 tablet   0   . oxyCODONE-acetaminophen (PERCOCET/ROXICET) 5-325 MG per tablet   Oral   Take 1 tablet by mouth every 4 (four) hours as needed for severe pain.          BP 142/58  Pulse 97  Temp(Src) 98.1 F (36.7 C) (Oral)  Resp 18  SpO2 100%  LMP 12/11/2013 Physical Exam  Nursing note and vitals reviewed. Constitutional: She is oriented to person, place, and time. She appears well-developed and well-nourished.  HENT:  Head: Normocephalic and atraumatic.  Eyes: Conjunctivae and EOM are normal.  Neck: Normal range of motion.  Cardiovascular: Normal rate and intact distal pulses.   Pulmonary/Chest: Effort normal.  Abdominal: She exhibits no distension.  Musculoskeletal: She exhibits tenderness. She exhibits no edema.  + Thompson test on right leg. Posterior right ankle tenderness to palpation. Limited ROM due to pain.   Neurological: She is alert and oriented to person, place, and time.  Sensation intact distal to injury.   Skin: Skin is warm and dry.  Psychiatric: She has a normal mood and affect.    ED Course  Procedures (including critical care time)  DIAGNOSTIC STUDIES: Oxygen Saturation is 100% on RA, normal by my interpretation.    COORDINATION OF CARE: 3:23 PM Will prescribe Percocet for pain control.  Discussed treatment plan with pt at bedside and pt agreed to plan.   Labs Review Labs Reviewed - No data to display Imaging Review No results found.  EKG Interpretation   None       MDM   1. Achilles tendon tear    Patient will have pain medication and recommended follow up with Orthopedics. Patient denies any new injury. No imaging indicated at this time.   I personally performed the services described in this documentation, which was scribed in my presence. The recorded information has been reviewed and is accurate.   Alvina Chou, PA-C 12/12/13 1751

## 2013-12-13 NOTE — ED Provider Notes (Signed)
Medical screening examination/treatment/procedure(s) were performed by non-physician practitioner and as supervising physician I was immediately available for consultation/collaboration.  EKG Interpretation   None        Jakyren Fluegge R. Jaye Polidori, MD 12/13/13 0002 

## 2013-12-17 ENCOUNTER — Encounter (HOSPITAL_COMMUNITY): Payer: Self-pay | Admitting: Emergency Medicine

## 2013-12-17 ENCOUNTER — Emergency Department (HOSPITAL_COMMUNITY): Payer: Medicaid Other

## 2013-12-17 ENCOUNTER — Emergency Department (HOSPITAL_COMMUNITY)
Admission: EM | Admit: 2013-12-17 | Discharge: 2013-12-17 | Disposition: A | Discharge status: Home / Self Care | Payer: Medicaid Other | Attending: Emergency Medicine | Admitting: Emergency Medicine

## 2013-12-17 DIAGNOSIS — S59919A Unspecified injury of unspecified forearm, initial encounter: Principal | ICD-10-CM

## 2013-12-17 DIAGNOSIS — Y939 Activity, unspecified: Secondary | ICD-10-CM | POA: Insufficient documentation

## 2013-12-17 DIAGNOSIS — Z91040 Latex allergy status: Secondary | ICD-10-CM | POA: Insufficient documentation

## 2013-12-17 DIAGNOSIS — Z8751 Personal history of pre-term labor: Secondary | ICD-10-CM | POA: Insufficient documentation

## 2013-12-17 DIAGNOSIS — S59909A Unspecified injury of unspecified elbow, initial encounter: Secondary | ICD-10-CM | POA: Insufficient documentation

## 2013-12-17 DIAGNOSIS — W010XXA Fall on same level from slipping, tripping and stumbling without subsequent striking against object, initial encounter: Secondary | ICD-10-CM | POA: Insufficient documentation

## 2013-12-17 DIAGNOSIS — W19XXXA Unspecified fall, initial encounter: Secondary | ICD-10-CM

## 2013-12-17 DIAGNOSIS — S6990XA Unspecified injury of unspecified wrist, hand and finger(s), initial encounter: Principal | ICD-10-CM | POA: Insufficient documentation

## 2013-12-17 DIAGNOSIS — Y929 Unspecified place or not applicable: Secondary | ICD-10-CM | POA: Insufficient documentation

## 2013-12-17 DIAGNOSIS — S59901A Unspecified injury of right elbow, initial encounter: Secondary | ICD-10-CM

## 2013-12-17 DIAGNOSIS — Z79899 Other long term (current) drug therapy: Secondary | ICD-10-CM | POA: Insufficient documentation

## 2013-12-17 MED ORDER — OXYCODONE-ACETAMINOPHEN 5-325 MG PO TABS: 2.0000 | ORAL_TABLET | ORAL | Status: DC | PRN

## 2013-12-17 MED ORDER — DIAZEPAM 5 MG PO TABS
5.0000 mg | ORAL_TABLET | Freq: Once | ORAL | Status: AC
  Administered 2013-12-17: 5 mg via ORAL
  Filled 2013-12-17: qty 1

## 2013-12-17 MED ORDER — KETOROLAC TROMETHAMINE 60 MG/2ML IM SOLN
60.0000 mg | Freq: Once | INTRAMUSCULAR | Status: AC
  Administered 2013-12-17: 60 mg via INTRAMUSCULAR
  Filled 2013-12-17: qty 2

## 2013-12-17 NOTE — Discharge Instructions (Signed)
Take Percocet as needed for pain. Refer to attached documents for more information. Follow up with your doctor or Orthopedic doctor for further evaluation or pain medication refills.

## 2013-12-17 NOTE — ED Notes (Signed)
Pt reports pain to achilles tendon on right foot. Has appt with ortho surgeon, states has tear to tendon. Pain unbearable.

## 2013-12-17 NOTE — ED Provider Notes (Signed)
CSN: [id number]     Arrival date & time 12/17/13  1627 History  This chart was scribed for non-physician practitioner, Alvina Chou, PA-C working with Saddie Benders. Dorna Mai, MD by Frederich Balding, ED scribe. This patient was seen in room TR09C/TR09C and the patient's care was started at [facility].  Chief Complaint  Patient presents with  . Ankle Pain   The history is provided by the patient. No language interpreter was used.   HPI Comments: Beth Jacobs is a 43 y.o. female who presents to the Emergency Department complaining of a fall that occurred around 3:30 PM today. Pt slipped and fell down the steps. She is having aching right elbow pain and continuing right ankle pain. Pt has been evaluated for her ankle several times in the past and diagnosed with a tear in her achilles tendon. She states she has an appointment with an orthopedic surgeon February 2.   Past Medical History  Diagnosis Date  . Preterm labor   . History of allergic reaction 02/26/2013   Past Surgical History  Procedure Laterality Date  . No past surgeries     Family History  Problem Relation Age of Onset  . Other Neg Hx   . Cerebral palsy Son     born at [redacted] weeks gestation   History  Substance Use Topics  . Smoking status: Never Smoker   . Smokeless tobacco: Not on file  . Alcohol Use: No   OB History   Grav Para Term Preterm Abortions TAB SAB Ect Mult Living   6 4 3 1 2 1 1   4      Review of Systems  Musculoskeletal: Positive for arthralgias.  All other systems reviewed and are negative.    Allergies  Bee venom; Latex; and Tomato  Home Medications   Current Outpatient Rx  Name  Route  Sig  Dispense  Refill  . Iron TABS   Oral   Take 1 tablet by mouth daily.         Marland Kitchen oxyCODONE-acetaminophen (PERCOCET/ROXICET) 5-325 MG per tablet   Oral   Take 1-2 tablets by mouth every 4 (four) hours as needed for severe pain.   20 tablet   0    BP 120/61  Pulse 82  Temp(Src) 98 F (36.7 C) (Oral)   Resp 16  SpO2 100%  LMP 12/11/2013  Physical Exam  Nursing note and vitals reviewed. Constitutional: She is oriented to person, place, and time. She appears well-developed and well-nourished. No distress.  HENT:  Head: Normocephalic and atraumatic.  Eyes: EOM are normal.  Neck: Neck supple. No tracheal deviation present.  Cardiovascular: Normal rate.   Pulmonary/Chest: Effort normal. No respiratory distress.  Musculoskeletal: Normal range of motion.  Right elbow tenderness to palpation. No obvious deformity. Full ROM.   Neurological: She is alert and oriented to person, place, and time.  Skin: Skin is warm and dry.  Psychiatric: She has a normal mood and affect. Her behavior is normal.    ED Course  Procedures (including critical care time)  DIAGNOSTIC STUDIES: Oxygen Saturation is 100% on RA, normal by my interpretation.    COORDINATION OF CARE: 6:14 PM-Discussed treatment plan which includes elbow xray with pt at bedside and pt agreed to plan.   Labs Review Labs Reviewed - No data to display Imaging Review Dg Elbow Complete Right  12/17/2013   CLINICAL DATA:  Elbow pain  EXAM: RIGHT ELBOW - COMPLETE 3+ VIEW  COMPARISON:  None.  FINDINGS:  There is no evidence of fracture, dislocation, or joint effusion. There is no evidence of arthropathy or other focal bone abnormality. Soft tissues are unremarkable.  IMPRESSION: Negative.   Electronically Signed   By: Daryll Brod M.D.   On: 12/17/2013 19:18   Dg Forearm Right  12/17/2013   CLINICAL DATA:  Fall with right forearm pain.  EXAM: RIGHT FOREARM - 2 VIEW  COMPARISON:  None.  FINDINGS: There is no evidence of fracture or other focal bone lesions. Soft tissues are unremarkable.  IMPRESSION: No acute fracture.   Electronically Signed   By: Aletta Edouard M.D.   On: 12/17/2013 19:22   Dg Wrist Complete Right  12/17/2013   CLINICAL DATA:  Fall with right wrist pain.  EXAM: RIGHT WRIST - COMPLETE 3+ VIEW  COMPARISON:  None.  FINDINGS:  No acute fracture or dislocation is identified. Small corticated fragment near the distal ulna has the appearance of an old injury or benign calcification. Borderline scapholunate distance of 3 mm may be reflective of acute or prior ligamentous injury.  Soft tissues are unremarkable. No evidence of bony lesion or erosion.  IMPRESSION: No acute fracture identified. Top normal scapholunate distance, as above.   Electronically Signed   By: Aletta Edouard M.D.   On: 12/17/2013 19:22    EKG Interpretation   None       MDM   1. Fall   2. Injury of right elbow     7:47 PM Xrays unremarkable for acute changes. Patient will be discharged with pain medication for injuries. Vitals stable and patient afebrile. Patient advised to ice injuries. No further evaluation needed at this time.   I personally performed the services described in this documentation, which was scribed in my presence. The recorded information has been reviewed and is accurate.   Alvina Chou, [place] 12/17/13 1953

## 2013-12-17 NOTE — ED Notes (Signed)
Pt given update on wait.

## 2013-12-18 NOTE — ED Provider Notes (Signed)
Medical screening examination/treatment/procedure(s) were performed by non-physician practitioner and as supervising physician I was immediately available for consultation/collaboration.  EKG Interpretation   None         Saddie Benders. Dorna Mai, MD 12/18/13 2103

## 2013-12-26 ENCOUNTER — Encounter (HOSPITAL_COMMUNITY): Payer: Self-pay | Admitting: Emergency Medicine

## 2013-12-26 ENCOUNTER — Emergency Department (HOSPITAL_COMMUNITY)
Admission: EM | Admit: 2013-12-26 | Discharge: 2013-12-26 | Disposition: A | Discharge status: Home / Self Care | Payer: Medicaid Other | Attending: Emergency Medicine | Admitting: Emergency Medicine

## 2013-12-26 DIAGNOSIS — R109 Unspecified abdominal pain: Secondary | ICD-10-CM | POA: Insufficient documentation

## 2013-12-26 DIAGNOSIS — N898 Other specified noninflammatory disorders of vagina: Secondary | ICD-10-CM | POA: Insufficient documentation

## 2013-12-26 DIAGNOSIS — Z3202 Encounter for pregnancy test, result negative: Secondary | ICD-10-CM | POA: Insufficient documentation

## 2013-12-26 DIAGNOSIS — F1420 Cocaine dependence, uncomplicated: Secondary | ICD-10-CM

## 2013-12-26 DIAGNOSIS — Z79899 Other long term (current) drug therapy: Secondary | ICD-10-CM | POA: Insufficient documentation

## 2013-12-26 DIAGNOSIS — N390 Urinary tract infection, site not specified: Secondary | ICD-10-CM | POA: Insufficient documentation

## 2013-12-26 DIAGNOSIS — Z91040 Latex allergy status: Secondary | ICD-10-CM | POA: Insufficient documentation

## 2013-12-26 DIAGNOSIS — Z8751 Personal history of pre-term labor: Secondary | ICD-10-CM | POA: Insufficient documentation

## 2013-12-26 LAB — WET PREP, GENITAL
Clue Cells Wet Prep HPF POC: NONE SEEN
Yeast Wet Prep HPF POC: NONE SEEN

## 2013-12-26 LAB — CBC WITH DIFFERENTIAL/PLATELET
BASOS ABS: 0 10*3/uL (ref 0.0–0.1)
Basophils Relative: 0 % (ref 0–1)
Eosinophils Absolute: 0.1 10*3/uL (ref 0.0–0.7)
Eosinophils Relative: 2 % (ref 0–5)
HEMATOCRIT: 39.5 % (ref 36.0–46.0)
Hemoglobin: 13 g/dL (ref 12.0–15.0)
LYMPHS ABS: 1.4 10*3/uL (ref 0.7–4.0)
LYMPHS PCT: 37 % (ref 12–46)
MCH: 29.7 pg (ref 26.0–34.0)
MCHC: 32.9 g/dL (ref 30.0–36.0)
MCV: 90.4 fL (ref 78.0–100.0)
MONO ABS: 0.4 10*3/uL (ref 0.1–1.0)
Monocytes Relative: 10 % (ref 3–12)
Neutro Abs: 2 10*3/uL (ref 1.7–7.7)
Neutrophils Relative %: 52 % (ref 43–77)
PLATELETS: 370 10*3/uL (ref 150–400)
RBC: 4.37 MIL/uL (ref 3.87–5.11)
RDW: 13.6 % (ref 11.5–15.5)
WBC: 3.9 10*3/uL — AB (ref 4.0–10.5)

## 2013-12-26 LAB — RAPID URINE DRUG SCREEN, HOSP PERFORMED
Amphetamines: NOT DETECTED
Barbiturates: NOT DETECTED
Benzodiazepines: NOT DETECTED
Cocaine: POSITIVE — AB
OPIATES: NOT DETECTED
Tetrahydrocannabinol: NOT DETECTED

## 2013-12-26 LAB — URINALYSIS, ROUTINE W REFLEX MICROSCOPIC
BILIRUBIN URINE: NEGATIVE
Glucose, UA: NEGATIVE mg/dL
Ketones, ur: NEGATIVE mg/dL
NITRITE: NEGATIVE
Protein, ur: NEGATIVE mg/dL
SPECIFIC GRAVITY, URINE: 1.029 (ref 1.005–1.030)
UROBILINOGEN UA: 1 mg/dL (ref 0.0–1.0)
pH: 5.5 (ref 5.0–8.0)

## 2013-12-26 LAB — URINE MICROSCOPIC-ADD ON

## 2013-12-26 LAB — BASIC METABOLIC PANEL
BUN: 11 mg/dL (ref 6–23)
CHLORIDE: 103 meq/L (ref 96–112)
CO2: 26 meq/L (ref 19–32)
Calcium: 9.1 mg/dL (ref 8.4–10.5)
Creatinine, Ser: 1 mg/dL (ref 0.50–1.10)
GFR calc Af Amer: 80 mL/min — ABNORMAL LOW (ref >90)
GFR calc non Af Amer: 69 mL/min — ABNORMAL LOW (ref >90)
Glucose, Bld: 79 mg/dL (ref 70–99)
Potassium: 3.9 mEq/L (ref 3.7–5.3)
SODIUM: 142 meq/L (ref 137–147)

## 2013-12-26 LAB — ETHANOL

## 2013-12-26 LAB — PREGNANCY, URINE: PREG TEST UR: NEGATIVE

## 2013-12-26 MED ORDER — ACETAMINOPHEN 325 MG PO TABS: 650.0000 mg | ORAL_TABLET | ORAL | Status: DC | PRN

## 2013-12-26 MED ORDER — ONDANSETRON HCL 4 MG PO TABS
4.0000 mg | ORAL_TABLET | Freq: Three times a day (TID) | ORAL | Status: DC | PRN
Start: 2013-12-26 — End: 2013-12-26

## 2013-12-26 MED ORDER — SULFAMETHOXAZOLE-TRIMETHOPRIM 800-160 MG PO TABS: 1.0000 | ORAL_TABLET | Freq: Two times a day (BID) | ORAL | Status: DC

## 2013-12-26 MED ORDER — METRONIDAZOLE 500 MG PO TABS
2000.0000 mg | ORAL_TABLET | Freq: Once | ORAL | Status: AC
  Administered 2013-12-26: 2000 mg via ORAL
  Filled 2013-12-26: qty 4

## 2013-12-26 MED ORDER — IBUPROFEN 200 MG PO TABS: 600.0000 mg | ORAL_TABLET | Freq: Three times a day (TID) | ORAL | Status: DC | PRN

## 2013-12-26 NOTE — ED Notes (Signed)
SPOKE TO MELISSA AT [facility]. SHE ADVISES THEY ARE NOT EXPECTING THIS PATIENT IN THEIR TREATMENT PROGRAM. SHE ALSO ADVISES THEY DO NOT DETOX COCAINE. MELISSA ADVISES THEY CURRENTLY HAVE A WAITING LIST OF APPROX 20 FEMALES WAITING TO GET INTO THEIR TREATMENT PROGRAM.

## 2013-12-26 NOTE — ED Notes (Addendum)
Pt here for lower abd pain deneis dysuria  Or vag d/c also seeking med clearance for cocaine denies SI or HI

## 2013-12-26 NOTE — ED Notes (Signed)
Pt c/o lower abd pain that started yesterday morning. Reports she has had some urinary frequency, denies burning sensation. Reports she has been sexually active and would like a STD check. sts minimal vaginal d/c. Pt also her for medical clearance so she can go to rehab to detox from cocaine. Last use cocaine yesterday, usually uses 2X a week, "no idea how much". Denies use of any other drugs. Denies use of ETOH. Pt in nad, skin warm and dry, resp e/u.

## 2013-12-26 NOTE — ED Provider Notes (Signed)
CSN: [id number]     Arrival date & time 12/26/13  1015 History   First MD Initiated Contact with Patient 12/26/13 1041     Chief Complaint  Patient presents with  . Abdominal Pain  . Medical Clearance   (Consider location/radiation/quality/duration/timing/severity/associated sxs/prior Treatment) Patient is a 43 y.o. female presenting with abdominal pain and drug problem. The history is provided by the patient. No language interpreter was used.  Abdominal Pain Pain location:  Suprapubic Pain quality: cramping   Pain radiates to:  Does not radiate Duration:  1 day Timing:  Constant Associated symptoms: vaginal bleeding   Associated symptoms: no chills, no constipation, no diarrhea, no dysuria, no fever, no nausea, no vaginal discharge and no vomiting   Associated symptoms comment:  Lower abdominal pain since yesterday. LMP 12/11/13, normal. No vaginal discharge, or dysuria. No change in bowel habit, fever or N, V.  Drug Problem Associated symptoms include abdominal pain. Pertinent negatives include no chills, fever, nausea or vomiting. Associated symptoms comments: She reports cocaine dependence that she is requesting help with. She has been in touch with [facility] who sent her here for medical clearance. She denies SI/HI, hallucinations. .    Past Medical History  Diagnosis Date  . Preterm labor   . History of allergic reaction 02/26/2013   Past Surgical History  Procedure Laterality Date  . No past surgeries     Family History  Problem Relation Age of Onset  . Other Neg Hx   . Cerebral palsy Son     born at [redacted] weeks gestation   History  Substance Use Topics  . Smoking status: Never Smoker   . Smokeless tobacco: Not on file  . Alcohol Use: No   OB History   Grav Para Term Preterm Abortions TAB SAB Ect Mult Living   6 4 3 1 2 1 1   4      Review of Systems  Constitutional: Negative for fever and chills.  HENT: Negative.   Respiratory: Negative.   Cardiovascular: Negative.    Gastrointestinal: Positive for abdominal pain. Negative for nausea, vomiting, diarrhea, constipation and blood in stool.  Genitourinary: Positive for vaginal bleeding. Negative for dysuria and vaginal discharge.  Musculoskeletal: Negative.  Negative for back pain.  Skin: Negative.   Neurological: Negative.     Allergies  Bee venom; Latex; and Tomato  Home Medications   Current Outpatient Rx  Name  Route  Sig  Dispense  Refill  . Iron TABS   Oral   Take 1 tablet by mouth daily.         Marland Kitchen oxyCODONE-acetaminophen (PERCOCET/ROXICET) 5-325 MG per tablet   Oral   Take 2 tablets by mouth every 4 (four) hours as needed for severe pain.   12 tablet   0    BP 109/44  Pulse 72  Temp(Src) 98.2 F (36.8 C)  Resp 16  SpO2 98%  LMP 12/11/2013 Physical Exam  Constitutional: She is oriented to person, place, and time. She appears well-developed and well-nourished.  HENT:  Head: Normocephalic.  Neck: Normal range of motion. Neck supple.  Cardiovascular: Normal rate and regular rhythm.   Pulmonary/Chest: Effort normal and breath sounds normal.  Abdominal: Soft. Bowel sounds are normal. There is no tenderness. There is no rebound and no guarding.  Genitourinary: Vagina normal and uterus normal. No vaginal discharge found.  No adnexal mass or tenderness.  Musculoskeletal: Normal range of motion.  Neurological: She is alert and oriented to person, place, and time.  Skin: Skin is warm and dry. No rash noted.  Psychiatric: She has a normal mood and affect.    ED Course  Procedures (including critical care time) Labs Review Labs Reviewed  CBC WITH DIFFERENTIAL - Abnormal; Notable for the following:    WBC 3.9 (*)    All other components within normal limits  WET PREP, GENITAL  GC/CHLAMYDIA PROBE AMP  BASIC METABOLIC PANEL  ETHANOL  URINE RAPID DRUG SCREEN (HOSP PERFORMED)  URINALYSIS, ROUTINE W REFLEX MICROSCOPIC  PREGNANCY, URINE   Imaging Review No results found.  EKG  Interpretation   None       MDM  No diagnosis found. 1. Abdominal pain 2. Cocaine dependence  Discussed patient with [facility] who states they do have a female bed available but that she would have to be referred, she could not refer herself. Will have [facility] get involved.     Dewaine Oats, PA-C 12/26/13 1323

## 2013-12-26 NOTE — Discharge Instructions (Signed)
Substance Abuse Your exam indicates that you have a problem with substance abuse. Substance abuse is the misuse of alcohol or drugs that causes problems in family life, friendships, and work relationships. Substance abuse is the most important cause of premature illness, disability, and death in our society. It is also the greatest threat to a person's mental and spiritual well being. Substance abuse can start out in an innocent way, such as social drinking or taking a little extra medication prescribed by your doctor. No one starts out with the intention of becoming an alcoholic or an addict. Substance abuse victims cannot control their use of alcohol or drugs. They may become intoxicated daily or go on weekend binges. Often there is a strong desire to quit, but attempts to stop using often fail. Encounters with law enforcement or conflicts with family members, friends, and work associates are signs of a potential problem. Recovery is always possible, although the craving for some drugs makes it difficult to quit without assistance. Many treatment programs are available to help people stop abusing alcohol or drugs. The first step in treatment is to admit you have a problem. This is a major hurdle because denial is a powerful force with substance abuse. Alcoholics Anonymous, Narcotics Anonymous, Cocaine Anonymous, and other recovery groups and programs can be very useful in helping people to quit. If you do not feel okay about your drug or alcohol use and if it is causing you trouble, we want to encourage you to talk about it with your doctor or with someone from a recovery group who can help you. You could also call the [employer] on Drug Abuse at 1-800-662-HELP. It is up to you to take the first step. AL-ANON and ALA-TEEN are support groups for friends and family members of an alcohol or drug dependent person. The people who love and care for the alcoholic or addicted person often need help, too.  For information about these organizations, check your phone directory or call a local alcohol or drug treatment center. Document Released: 12/16/2004 Document Revised: 01/31/2012 Document Reviewed: 11/09/2008 [facility] Patient Information 2014 [place].  Urinary Tract Infection Urinary tract infections (UTIs) can develop anywhere along your urinary tract. Your urinary tract is your body's drainage system for removing wastes and extra water. Your urinary tract includes two kidneys, two ureters, a bladder, and a urethra. Your kidneys are a pair of bean-shaped organs. Each kidney is about the size of your fist. They are located below your ribs, one on each side of your spine. CAUSES Infections are caused by microbes, which are microscopic organisms, including fungi, viruses, and bacteria. These organisms are so small that they can only be seen through a microscope. Bacteria are the microbes that most commonly cause UTIs. SYMPTOMS  Symptoms of UTIs may vary by age and gender of the patient and by the location of the infection. Symptoms in young women typically include a frequent and intense urge to urinate and a painful, burning feeling in the bladder or urethra during urination. Older women and men are more likely to be tired, shaky, and weak and have muscle aches and abdominal pain. A fever may mean the infection is in your kidneys. Other symptoms of a kidney infection include pain in your back or sides below the ribs, nausea, and vomiting. DIAGNOSIS To diagnose a UTI, your caregiver will ask you about your symptoms. Your caregiver also will ask to provide a urine sample. The urine sample will be tested for bacteria  and Markovitz blood cells. Hanf blood cells are made by your body to help fight infection. TREATMENT  Typically, UTIs can be treated with medication. Because most UTIs are caused by a bacterial infection, they usually can be treated with the use of antibiotics. The choice of antibiotic and  length of treatment depend on your symptoms and the type of bacteria causing your infection. HOME CARE INSTRUCTIONS  If you were prescribed antibiotics, take them exactly as your caregiver instructs you. Finish the medication even if you feel better after you have only taken some of the medication.  Drink enough water and fluids to keep your urine clear or pale yellow.  Avoid caffeine, tea, and carbonated beverages. They tend to irritate your bladder.  Empty your bladder often. Avoid holding urine for long periods of time.  Empty your bladder before and after sexual intercourse.  After a bowel movement, women should cleanse from front to back. Use each tissue only once. SEEK MEDICAL CARE IF:   You have back pain.  You develop a fever.  Your symptoms do not begin to resolve within 3 days. SEEK IMMEDIATE MEDICAL CARE IF:   You have severe back pain or lower abdominal pain.  You develop chills.  You have nausea or vomiting.  You have continued burning or discomfort with urination. MAKE SURE YOU:   Understand these instructions.  Will watch your condition.  Will get help right away if you are not doing well or get worse. Document Released: 08/18/2005 Document Revised: 05/09/2012 Document Reviewed: 12/17/2011 [facility] Patient Information 2014 [place].  Results for orders placed during the hospital encounter of 12/26/13  WET PREP, GENITAL      Result Value Range   Yeast Wet Prep HPF POC NONE SEEN  NONE SEEN   Trich, Wet Prep FEW (*) NONE SEEN   Clue Cells Wet Prep HPF POC NONE SEEN  NONE SEEN   WBC, Wet Prep HPF POC FEW (*) NONE SEEN  CBC WITH DIFFERENTIAL      Result Value Range   WBC 3.9 (*) 4.0 - 10.5 K/uL   RBC 4.37  3.87 - 5.11 MIL/uL   Hemoglobin 13.0  12.0 - 15.0 g/dL   HCT 39.5  36.0 - 46.0 %   MCV 90.4  78.0 - 100.0 fL   MCH 29.7  26.0 - 34.0 pg   MCHC 32.9  30.0 - 36.0 g/dL   RDW 13.6  11.5 - 15.5 %   Platelets 370  150 - 400 K/uL   Neutrophils  Relative % 52  43 - 77 %   Neutro Abs 2.0  1.7 - 7.7 K/uL   Lymphocytes Relative 37  12 - 46 %   Lymphs Abs 1.4  0.7 - 4.0 K/uL   Monocytes Relative 10  3 - 12 %   Monocytes Absolute 0.4  0.1 - 1.0 K/uL   Eosinophils Relative 2  0 - 5 %   Eosinophils Absolute 0.1  0.0 - 0.7 K/uL   Basophils Relative 0  0 - 1 %   Basophils Absolute 0.0  0.0 - 0.1 K/uL  BASIC METABOLIC PANEL      Result Value Range   Sodium 142  137 - 147 mEq/L   Potassium 3.9  3.7 - 5.3 mEq/L   Chloride 103  96 - 112 mEq/L   CO2 26  19 - 32 mEq/L   Glucose, Bld 79  70 - 99 mg/dL   BUN 11  6 - 23 mg/dL  Creatinine, Ser 1.00  0.50 - 1.10 mg/dL   Calcium 9.1  8.4 - 10.5 mg/dL   GFR calc non Af Amer 69 (*) >90 mL/min   GFR calc Af Amer 80 (*) >90 mL/min  ETHANOL      Result Value Range   Alcohol, Ethyl (B) <11  0 - 11 mg/dL  URINE RAPID DRUG SCREEN (HOSP PERFORMED)      Result Value Range   Opiates NONE DETECTED  NONE DETECTED   Cocaine POSITIVE (*) NONE DETECTED   Benzodiazepines NONE DETECTED  NONE DETECTED   Amphetamines NONE DETECTED  NONE DETECTED   Tetrahydrocannabinol NONE DETECTED  NONE DETECTED   Barbiturates NONE DETECTED  NONE DETECTED  URINALYSIS, ROUTINE W REFLEX MICROSCOPIC      Result Value Range   Color, Urine YELLOW  YELLOW   APPearance HAZY (*) CLEAR   Specific Gravity, Urine 1.029  1.005 - 1.030   pH 5.5  5.0 - 8.0   Glucose, UA NEGATIVE  NEGATIVE mg/dL   Hgb urine dipstick SMALL (*) NEGATIVE   Bilirubin Urine NEGATIVE  NEGATIVE   Ketones, ur NEGATIVE  NEGATIVE mg/dL   Protein, ur NEGATIVE  NEGATIVE mg/dL   Urobilinogen, UA 1.0  0.0 - 1.0 mg/dL   Nitrite NEGATIVE  NEGATIVE   Leukocytes, UA SMALL (*) NEGATIVE  PREGNANCY, URINE      Result Value Range   Preg Test, Ur NEGATIVE  NEGATIVE  URINE MICROSCOPIC-ADD ON      Result Value Range   Squamous Epithelial / LPF FEW (*) RARE   WBC, UA 11-20  <3 WBC/hpf   RBC / HPF 3-6  <3 RBC/hpf   Bacteria, UA FEW (*) RARE   Urine-Other MUCOUS  PRESENT

## 2013-12-27 LAB — URINE CULTURE

## 2013-12-27 LAB — GC/CHLAMYDIA PROBE AMP
CT PROBE, AMP APTIMA: NEGATIVE
GC PROBE AMP APTIMA: NEGATIVE

## 2013-12-28 NOTE — ED Provider Notes (Signed)
Medical screening examination/treatment/procedure(s) were performed by non-physician practitioner and as supervising physician I was immediately available for consultation/collaboration.  EKG Interpretation   None         Mervin Kung, MD 12/28/13 [phone]

## 2013-12-31 ENCOUNTER — Emergency Department (HOSPITAL_COMMUNITY): Payer: Medicaid Other

## 2013-12-31 ENCOUNTER — Emergency Department (HOSPITAL_COMMUNITY)
Admission: EM | Admit: 2013-12-31 | Discharge: 2013-12-31 | Disposition: A | Discharge status: Home / Self Care | Payer: Medicaid Other | Attending: Emergency Medicine | Admitting: Emergency Medicine

## 2013-12-31 ENCOUNTER — Encounter (HOSPITAL_COMMUNITY): Payer: Self-pay | Admitting: Emergency Medicine

## 2013-12-31 DIAGNOSIS — S79919A Unspecified injury of unspecified hip, initial encounter: Secondary | ICD-10-CM | POA: Insufficient documentation

## 2013-12-31 DIAGNOSIS — Y9389 Activity, other specified: Secondary | ICD-10-CM | POA: Insufficient documentation

## 2013-12-31 DIAGNOSIS — Z91040 Latex allergy status: Secondary | ICD-10-CM | POA: Insufficient documentation

## 2013-12-31 DIAGNOSIS — S4990XA Unspecified injury of shoulder and upper arm, unspecified arm, initial encounter: Secondary | ICD-10-CM

## 2013-12-31 DIAGNOSIS — S79929A Unspecified injury of unspecified thigh, initial encounter: Principal | ICD-10-CM

## 2013-12-31 DIAGNOSIS — Y92410 Unspecified street and highway as the place of occurrence of the external cause: Secondary | ICD-10-CM | POA: Insufficient documentation

## 2013-12-31 DIAGNOSIS — S4980XA Other specified injuries of shoulder and upper arm, unspecified arm, initial encounter: Secondary | ICD-10-CM | POA: Insufficient documentation

## 2013-12-31 DIAGNOSIS — S46909A Unspecified injury of unspecified muscle, fascia and tendon at shoulder and upper arm level, unspecified arm, initial encounter: Secondary | ICD-10-CM | POA: Insufficient documentation

## 2013-12-31 DIAGNOSIS — Z79899 Other long term (current) drug therapy: Secondary | ICD-10-CM | POA: Insufficient documentation

## 2013-12-31 DIAGNOSIS — Z8751 Personal history of pre-term labor: Secondary | ICD-10-CM | POA: Insufficient documentation

## 2013-12-31 DIAGNOSIS — W19XXXA Unspecified fall, initial encounter: Secondary | ICD-10-CM

## 2013-12-31 HISTORY — DX: Strain of unspecified achilles tendon, initial encounter: S86.019A

## 2013-12-31 MED ORDER — IBUPROFEN 600 MG PO TABS: 600.0000 mg | ORAL_TABLET | Freq: Four times a day (QID) | ORAL | Status: DC | PRN

## 2013-12-31 MED ORDER — HYDROCODONE-ACETAMINOPHEN 5-325 MG PO TABS: 2.0000 | ORAL_TABLET | ORAL | Status: DC | PRN

## 2013-12-31 MED ORDER — METHOCARBAMOL 500 MG PO TABS: 500.0000 mg | ORAL_TABLET | Freq: Two times a day (BID) | ORAL | Status: DC
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
## 2014-01-03 NOTE — ED Provider Notes (Signed)
Medical screening examination/treatment/procedure(s) were performed by non-physician practitioner and as supervising physician I was immediately available for consultation/collaboration.  EKG Interpretation   None         Blanchard Kelch, MD 01/03/14 [phone]

## 2014-01-12 ENCOUNTER — Emergency Department (HOSPITAL_COMMUNITY)
Admission: EM | Admit: 2014-01-12 | Discharge: 2014-01-12 | Disposition: A | Discharge status: Home / Self Care | Payer: Medicaid Other | Attending: Emergency Medicine | Admitting: Emergency Medicine

## 2014-01-12 ENCOUNTER — Emergency Department (HOSPITAL_COMMUNITY): Payer: Medicaid Other

## 2014-01-12 ENCOUNTER — Encounter (HOSPITAL_COMMUNITY): Payer: Self-pay | Admitting: Emergency Medicine

## 2014-01-12 DIAGNOSIS — Z91040 Latex allergy status: Secondary | ICD-10-CM | POA: Insufficient documentation

## 2014-01-12 DIAGNOSIS — S39012A Strain of muscle, fascia and tendon of lower back, initial encounter: Secondary | ICD-10-CM

## 2014-01-12 DIAGNOSIS — S93409A Sprain of unspecified ligament of unspecified ankle, initial encounter: Secondary | ICD-10-CM

## 2014-01-12 DIAGNOSIS — Z8751 Personal history of pre-term labor: Secondary | ICD-10-CM | POA: Insufficient documentation

## 2014-01-12 DIAGNOSIS — Z79899 Other long term (current) drug therapy: Secondary | ICD-10-CM | POA: Insufficient documentation

## 2014-01-12 DIAGNOSIS — W010XXA Fall on same level from slipping, tripping and stumbling without subsequent striking against object, initial encounter: Secondary | ICD-10-CM | POA: Insufficient documentation

## 2014-01-12 DIAGNOSIS — S335XXA Sprain of ligaments of lumbar spine, initial encounter: Secondary | ICD-10-CM | POA: Insufficient documentation

## 2014-01-12 DIAGNOSIS — Y9301 Activity, walking, marching and hiking: Secondary | ICD-10-CM | POA: Insufficient documentation

## 2014-01-12 DIAGNOSIS — Y929 Unspecified place or not applicable: Secondary | ICD-10-CM | POA: Insufficient documentation

## 2014-01-12 MED ORDER — OXYCODONE-ACETAMINOPHEN 5-325 MG PO TABS: 1.0000 | ORAL_TABLET | Freq: Four times a day (QID) | ORAL | Status: DC | PRN

## 2014-01-12 MED ORDER — IBUPROFEN 800 MG PO TABS: 800.0000 mg | ORAL_TABLET | Freq: Three times a day (TID) | ORAL | Status: DC | PRN

## 2014-01-12 MED ORDER — OXYCODONE-ACETAMINOPHEN 5-325 MG PO TABS
1.0000 | ORAL_TABLET | Freq: Once | ORAL | Status: AC
  Administered 2014-01-12: 1 via ORAL
  Filled 2014-01-12: qty 1

## 2014-01-12 NOTE — Discharge Instructions (Signed)
Return here as needed. Follow up with the doctor provided the x-rays were normal

## 2014-01-12 NOTE — ED Notes (Signed)
Pt presents with c/o lower back pain and right leg pain/ankle pain that occurred after a fall about one hour ago. Pt says she slipped on the ice and landed on her back. Pt says she has not walked more than a few steps since the fall.

## 2014-01-12 NOTE — ED Provider Notes (Signed)
CSN: [id number]     Arrival date & time 01/12/14  1439 History   First MD Initiated Contact with Patient 01/12/14 1615     Chief Complaint  Patient presents with  . Fall  . Back Pain  . Leg Pain     (Consider location/radiation/quality/duration/timing/severity/associated sxs/prior Treatment) Patient is a 43 y.o. female presenting with fall, back pain, and leg pain.  Fall  Back Pain Associated symptoms: leg pain   Leg Pain Associated symptoms: back pain    HPI Comments: Beth Jacobs is a 43 y.o. female with PMH of torn right achilles tendon who presents to the Emergency Department complaining of right ankle pain after a fall. Patient fell while walking on ice and fell onto her lower back and also sustained an inversion sprain of her right ankle. She ranks the pain as an 10/10 and it hurts to move it. Her lower back pain is ranked a 8/10, is sharp, and doesn't radiate. Denies hitting head during her fall. She recently sustained an achilles tendon tear approximately 2 months ago, for which she is postponing surgery because of fear. Patient denies nausea, vomiting, diarrhea, syncope, SOB, chest pain, and paresthesias.   Past Medical History  Diagnosis Date  . Preterm labor   . History of allergic reaction 02/26/2013  . Torn Achilles tendon    Past Surgical History  Procedure Laterality Date  . No past surgeries     Family History  Problem Relation Age of Onset  . Other Neg Hx   . Cerebral palsy Son     born at [redacted] weeks gestation   History  Substance Use Topics  . Smoking status: Never Smoker   . Smokeless tobacco: Not on file  . Alcohol Use: No   OB History   Grav Para Term Preterm Abortions TAB SAB Ect Mult Living   6 4 3 1 2 1 1   4      Review of Systems  Musculoskeletal: Positive for back pain.   All other systems negative except as documented in the HPI. All pertinent positives and negatives as reviewed in the HPI.    Allergies  Bee venom;  Hydrocodone-acetaminophen; Latex; and Tomato  Home Medications   Current Outpatient Rx  Name  Route  Sig  Dispense  Refill  . ferrous sulfate 325 (65 FE) MG tablet   Oral   Take 325 mg by mouth daily with breakfast.         . ibuprofen (ADVIL,MOTRIN) 600 MG tablet   Oral   Take 1 tablet (600 mg total) by mouth every 6 (six) hours as needed.   30 tablet   0   . methocarbamol (ROBAXIN) 500 MG tablet   Oral   Take 1 tablet (500 mg total) by mouth 2 (two) times daily.   20 tablet   0   . sulfamethoxazole-trimethoprim (SEPTRA DS) 800-160 MG per tablet   Oral   Take 1 tablet by mouth every 12 (twelve) hours.   10 tablet   0    BP 118/90  Pulse 82  Temp(Src) 97.5 F (36.4 C) (Oral)  Resp 18  SpO2 100% Physical Exam  Nursing note and vitals reviewed. Constitutional: She is oriented to person, place, and time. She appears well-developed and well-nourished. No distress.  HENT:  Head: Normocephalic and atraumatic.  Neck: Normal range of motion. Neck supple.  Cardiovascular: Normal rate and regular rhythm.   Pulmonary/Chest: Effort normal and breath sounds normal.  Musculoskeletal:  Right ankle: She exhibits decreased range of motion and swelling. She exhibits normal pulse. Tenderness. Lateral malleolus and AITFL tenderness found. Achilles tendon exhibits pain and abnormal Thompson's test results.       Lumbar back: She exhibits tenderness and pain.  Neurological: She is alert and oriented to person, place, and time.  Skin: Skin is warm and dry. She is not diaphoretic.     ED Course  Procedures (including critical care time) Labs Review Labs Reviewed - No data to display Imaging Review Dg Ankle Complete Right  01/12/2014   CLINICAL DATA:  Fall, ankle pain  EXAM: RIGHT ANKLE - COMPLETE 3+ VIEW  COMPARISON:  11/05/2013  FINDINGS: Normal alignment without fracture. Malleoli, talus and calcaneus appear intact. 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  IMPRESSION: No acute osseous finding or fracture   Electronically Signed   By: Daryll Brod M.D.   On: 01/12/2014 16:26    Patient be treated for ankle sprain and lumbar strain.  Told to return here as needed.  Given orthopedic followup.  Ice and elevate her ankle    Brent General, PA-C 01/13/14 2019

## 2014-01-13 NOTE — ED Provider Notes (Signed)
History/physical exam/procedure(s) were performed by non-physician practitioner and as supervising physician I was immediately available for consultation/collaboration. I have reviewed all notes and am in agreement with care and plan.   Shaune Pollack, MD 01/13/14 2123

## 2014-01-16 ENCOUNTER — Encounter (HOSPITAL_COMMUNITY): Payer: Self-pay | Admitting: Emergency Medicine

## 2014-01-16 ENCOUNTER — Emergency Department (HOSPITAL_COMMUNITY)
Admission: EM | Admit: 2014-01-16 | Discharge: 2014-01-16 | Disposition: A | Discharge status: Home / Self Care | Payer: Medicaid Other | Attending: Emergency Medicine | Admitting: Emergency Medicine

## 2014-01-16 DIAGNOSIS — Z8751 Personal history of pre-term labor: Secondary | ICD-10-CM | POA: Insufficient documentation

## 2014-01-16 DIAGNOSIS — Z87828 Personal history of other (healed) physical injury and trauma: Secondary | ICD-10-CM | POA: Insufficient documentation

## 2014-01-16 DIAGNOSIS — M5430 Sciatica, unspecified side: Secondary | ICD-10-CM | POA: Insufficient documentation

## 2014-01-16 DIAGNOSIS — Y9289 Other specified places as the place of occurrence of the external cause: Secondary | ICD-10-CM | POA: Insufficient documentation

## 2014-01-16 DIAGNOSIS — M5431 Sciatica, right side: Secondary | ICD-10-CM

## 2014-01-16 DIAGNOSIS — IMO0002 Reserved for concepts with insufficient information to code with codable children: Secondary | ICD-10-CM | POA: Insufficient documentation

## 2014-01-16 DIAGNOSIS — Z79899 Other long term (current) drug therapy: Secondary | ICD-10-CM | POA: Insufficient documentation

## 2014-01-16 DIAGNOSIS — Y9389 Activity, other specified: Secondary | ICD-10-CM | POA: Insufficient documentation

## 2014-01-16 DIAGNOSIS — Z91040 Latex allergy status: Secondary | ICD-10-CM | POA: Insufficient documentation

## 2014-01-16 MED ORDER — DIAZEPAM 5 MG PO TABS
5.0000 mg | ORAL_TABLET | Freq: Once | ORAL | Status: AC
  Administered 2014-01-16: 5 mg via ORAL
  Filled 2014-01-16: qty 1

## 2014-01-16 MED ORDER — IBUPROFEN 600 MG PO TABS: 600.0000 mg | ORAL_TABLET | Freq: Four times a day (QID) | ORAL | Status: DC | PRN

## 2014-01-16 MED ORDER — PREDNISONE 20 MG PO TABS: 40.0000 mg | ORAL_TABLET | Freq: Every day | ORAL | Status: DC

## 2014-01-16 MED ORDER — IBUPROFEN 800 MG PO TABS
800.0000 mg | ORAL_TABLET | Freq: Once | ORAL | Status: AC
  Administered 2014-01-16: 800 mg via ORAL
  Filled 2014-01-16: qty 1

## 2014-01-16 MED ORDER — DIAZEPAM 5 MG PO TABS: 5.0000 mg | ORAL_TABLET | Freq: Two times a day (BID) | ORAL | Status: DC

## 2014-01-16 MED ORDER — OXYCODONE-ACETAMINOPHEN 5-325 MG PO TABS: 1.0000 | ORAL_TABLET | Freq: Four times a day (QID) | ORAL | Status: DC | PRN

## 2014-01-16 MED ORDER — OXYCODONE-ACETAMINOPHEN 5-325 MG PO TABS
1.0000 | ORAL_TABLET | Freq: Once | ORAL | Status: AC
  Administered 2014-01-16: 1 via ORAL
  Filled 2014-01-16: qty 1

## 2014-01-16 NOTE — ED Notes (Signed)
Pts signigicant other to drive home.

## 2014-01-16 NOTE — Discharge Instructions (Signed)
Only take pain medication for severe pain.  Be aware that oxycodone and valium may cause drowsiness. Do not drive or operate heavy machinery while taking. Follow up with primary care for continued back pain. You may need referral to physical therapy if pain not improving.
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
## 2014-01-17 NOTE — ED Provider Notes (Signed)
Medical screening examination/treatment/procedure(s) were performed by non-physician practitioner and as supervising physician I was immediately available for consultation/collaboration.  Abou Sterkel, MD 01/17/14 1559 

## 2014-01-20 ENCOUNTER — Emergency Department (HOSPITAL_COMMUNITY)
Admission: EM | Admit: 2014-01-20 | Discharge: 2014-01-20 | Disposition: A | Discharge status: Home / Self Care | Payer: Medicaid Other | Attending: Emergency Medicine | Admitting: Emergency Medicine

## 2014-01-20 ENCOUNTER — Encounter (HOSPITAL_COMMUNITY): Payer: Self-pay | Admitting: Emergency Medicine

## 2014-01-20 DIAGNOSIS — Z8751 Personal history of pre-term labor: Secondary | ICD-10-CM | POA: Insufficient documentation

## 2014-01-20 DIAGNOSIS — IMO0002 Reserved for concepts with insufficient information to code with codable children: Secondary | ICD-10-CM | POA: Insufficient documentation

## 2014-01-20 DIAGNOSIS — M25579 Pain in unspecified ankle and joints of unspecified foot: Secondary | ICD-10-CM

## 2014-01-20 DIAGNOSIS — Z91040 Latex allergy status: Secondary | ICD-10-CM | POA: Insufficient documentation

## 2014-01-20 DIAGNOSIS — Z79899 Other long term (current) drug therapy: Secondary | ICD-10-CM | POA: Insufficient documentation

## 2014-01-20 NOTE — ED Notes (Signed)
At approx 0953, pt arrived to [facility] room #8. Upon RN entering room, pt asked if the man sitting at the desk is the person who is going to be taking care of her. RN advised if she was referring to [place], [place], then yes. Pt then got up out of the stretcher chair, picked up her coat, and attempted to walk out of room, stating she was going to go to [facility] because she was not going to see him. Pt stated she did not like the care he provided to her husband. Stating he should have been given rx for the same med that he gave him while in the ED because it took his pain away. RN encouraged pt to stay, advising her that maybe another provider could possibly provide her care. Pt agreed and sat back down. RN advised Gertie Fey of pt's concerns. Gertie Fey advised he would go speak w/EDP.

## 2014-01-20 NOTE — ED Notes (Signed)
Pt c/o chronic right foot pain - states has torn Achilles tendon x 3 months. States pain worse since last pm. Tramadol/Ibuprofen not helping.

## 2014-01-20 NOTE — ED Provider Notes (Signed)
CSN: [id number]     Arrival date & time 01/20/14  2130 History   First MD Initiated Contact with Patient 01/20/14 [phone]     Chief Complaint  Patient presents with  . Foot Pain     (Consider location/radiation/quality/duration/timing/severity/associated sxs/prior Treatment) HPI Patient presents with concerns of ongoing ankle pain. Pain is in the posterior of the right ankle.  Patient states that she tore her Achilles tendon several months ago. She has not followed up with orthopedics.  She has had persistent pain, including worsening of the pain today.  The pain is focally in this area, with rotation toward the anterior of the foot.  No new trauma or fall, no no dysesthesia or weakness. Patient previously was prescribed narcotics on several occasions by multiple providers. She currently has no narcotics, and is not obtaining substantial relief with OTC medications.    Past Medical History  Diagnosis Date  . Preterm labor   . History of allergic reaction 02/26/2013  . Torn Achilles tendon    Past Surgical History  Procedure Laterality Date  . No past surgeries     Family History  Problem Relation Age of Onset  . Other Neg Hx   . Cerebral palsy Son     born at [redacted] weeks gestation   History  Substance Use Topics  . Smoking status: Never Smoker   . Smokeless tobacco: Not on file  . Alcohol Use: No   OB History   Grav Para Term Preterm Abortions TAB SAB Ect Mult Living   6 4 3 1 2 1 1   4      Review of Systems  All other systems reviewed and are negative.      Allergies  Bee venom; Hydrocodone-acetaminophen; Latex; and Tomato  Home Medications   Current Outpatient Rx  Name  Route  Sig  Dispense  Refill  . diazepam (VALIUM) 5 MG tablet   Oral   Take 1 tablet (5 mg total) by mouth 2 (two) times daily.   10 tablet   0   . ferrous sulfate 325 (65 FE) MG tablet   Oral   Take 325 mg by mouth daily with breakfast.         . ibuprofen (ADVIL,MOTRIN) 600 MG  tablet   Oral   Take 1 tablet (600 mg total) by mouth every 6 (six) hours as needed.   30 tablet   0   . oxyCODONE-acetaminophen (PERCOCET/ROXICET) 5-325 MG per tablet   Oral   Take 1 tablet by mouth every 6 (six) hours as needed for severe pain.   6 tablet   0   . predniSONE (DELTASONE) 20 MG tablet   Oral   Take 2 tablets (40 mg total) by mouth daily.   10 tablet   0    BP 110/51  Pulse 84  Temp(Src) 98.1 F (36.7 C) (Oral)  Resp 18  Ht 5\' 2"  (1.575 m)  Wt 167 lb (75.751 kg)  BMI 30.54 kg/m2  SpO2 98%  Breastfeeding? No Physical Exam  Nursing note and vitals reviewed. Constitutional: She is oriented to person, place, and time. She appears well-developed and well-nourished. No distress.  HENT:  Head: Normocephalic and atraumatic.  Eyes: Conjunctivae and EOM are normal.  Cardiovascular: Normal rate and regular rhythm.   Pulmonary/Chest: Effort normal and breath sounds normal. No stridor. No respiratory distress.  Abdominal: She exhibits no distension.  Musculoskeletal: She exhibits no edema.       Feet:  Neurological: She  is alert and oriented to person, place, and time. No cranial nerve deficit.  Skin: Skin is warm and dry.  Psychiatric: She has a normal mood and affect.    ED Course  Procedures (including critical care time)  After the initial evaluation I reviewed the patient's chart.  She is told visit in 6 months, including multiple visits for similar concerns.  Patient was seen one week ago, and 2 weeks ago for musculoskeletal pain.  Patient has received narcotics on multiple prior occasions. When I discussed with her the need to complete her orthopedic followup, to use ice, anti-inflammatories regularly. Patient requests narcotics, but was informed that narcotics should not come from multiple providers, and the patient needs to follow up with her orthopedist.   MDM   This female presents several months after injuring her ankle, now with concerns of  ongoing pain.  Patient is neurovascularly intact, though she does have decreased strength in the ankle with both flexion and extension.  Patient has previously missed multiple orthopedic appointments, has been seen and evaluated for similar ankle pain multiple times in the recent past.  Patient was encouraged to follow up with orthopedics.  She was provided explicit pain control guidelines including anti-inflammatories, ice.  She was discharged in stable condition, though she does continue to have pain to follow up with orthopedics.   Carmin Muskrat, MD 01/20/14 1037

## 2014-01-20 NOTE — ED Notes (Signed)
Pt requested Director Number regarding no pain meds rx given to pt and pt upset with quality of care from medical staff.

## 2014-01-20 NOTE — Discharge Instructions (Signed)
As discussed, it is very important that you followup with an orthopedist for further evaluation and management of your ankle pain.  Please use ibuprofen, 800 mg, 3 times daily, ice, for 20 minutes per application, 4 times daily.   Please use the crutches you have previously been provided, as well as the brace that was previously provided for additional comfort.  Return here for additional concerning changes in your condition.   Cryotherapy Cryotherapy means treatment with cold. Ice or gel packs can be used to reduce both pain and swelling. Ice is the most helpful within the first 24 to 48 hours after an injury or flareup from overusing a muscle or joint. Sprains, strains, spasms, burning pain, shooting pain, and aches can all be eased with ice. Ice can also be used when recovering from surgery. Ice is effective, has very few side effects, and is safe for most people to use. PRECAUTIONS  Ice is not a safe treatment option for people with:  Raynaud's phenomenon. This is a condition affecting small blood vessels in the extremities. Exposure to cold may cause your problems to return.  Cold hypersensitivity. There are many forms of cold hypersensitivity, including:  Cold urticaria. Red, itchy hives appear on the skin when the tissues begin to warm after being iced.  Cold erythema. This is a red, itchy rash caused by exposure to cold.  Cold hemoglobinuria. Red blood cells break down when the tissues begin to warm after being iced. The hemoglobin that carry oxygen are passed into the urine because they cannot combine with blood proteins fast enough.  Numbness or altered sensitivity in the area being iced. If you have any of the following conditions, do not use ice until you have discussed cryotherapy with your caregiver:  Heart conditions, such as arrhythmia, angina, or chronic heart disease.  High blood pressure.  Healing wounds or open skin in the area being iced.  Current  infections.  Rheumatoid arthritis.  Poor circulation.  Diabetes. Ice slows the blood flow in the region it is applied. This is beneficial when trying to stop inflamed tissues from spreading irritating chemicals to surrounding tissues. However, if you expose your skin to cold temperatures for too long or without the proper protection, you can damage your skin or nerves. Watch for signs of skin damage due to cold. HOME CARE INSTRUCTIONS Follow these tips to use ice and cold packs safely.  Place a dry or damp towel between the ice and skin. A damp towel will cool the skin more quickly, so you may need to shorten the time that the ice is used.  For a more rapid response, add gentle compression to the ice.  Ice for no more than 10 to 20 minutes at a time. The bonier the area you are icing, the less time it will take to get the benefits of ice.  Check your skin after 5 minutes to make sure there are no signs of a poor response to cold or skin damage.  Rest 20 minutes or more in between uses.  Once your skin is numb, you can end your treatment. You can test numbness by very lightly touching your skin. The touch should be so light that you do not see the skin dimple from the pressure of your fingertip. When using ice, most people will feel these normal sensations in this order: cold, burning, aching, and numbness.  Do not use ice on someone who cannot communicate their responses to pain, such as small children or  people with dementia. HOW TO MAKE AN ICE PACK Ice packs are the most common way to use ice therapy. Other methods include ice massage, ice baths, and cryo-sprays. Muscle creams that cause a cold, tingly feeling do not offer the same benefits that ice offers and should not be used as a substitute unless recommended by your caregiver. To make an ice pack, do one of the following:  Place crushed ice or a bag of frozen vegetables in a sealable plastic bag. Squeeze out the excess air. Place  this bag inside another plastic bag. Slide the bag into a pillowcase or place a damp towel between your skin and the bag.  Mix 3 parts water with 1 part rubbing alcohol. Freeze the mixture in a sealable plastic bag. When you remove the mixture from the freezer, it will be slushy. Squeeze out the excess air. Place this bag inside another plastic bag. Slide the bag into a pillowcase or place a damp towel between your skin and the bag. SEEK MEDICAL CARE IF:  You develop Heine spots on your skin. This may give the skin a blotchy (mottled) appearance.  Your skin turns blue or pale.  Your skin becomes waxy or hard.  Your swelling gets worse. MAKE SURE YOU:   Understand these instructions.  Will watch your condition.  Will get help right away if you are not doing well or get worse. Document Released: 07/05/2011 Document Revised: 01/31/2012 Document Reviewed: 07/05/2011 [facility] Patient Information 2014 [place], [place].  Ankle Pain Ankle pain is a common symptom. The bones, cartilage, tendons, and muscles of the ankle joint perform a lot of work each day. The ankle joint holds your body weight and allows you to move around. Ankle pain can occur on either side or back of 1 or both ankles. Ankle pain may be sharp and burning or dull and aching. There may be tenderness, stiffness, redness, or warmth around the ankle. The pain occurs more often when a person walks or puts pressure on the ankle. CAUSES  There are many reasons ankle pain can develop. It is important to work with your caregiver to identify the cause since many conditions can impact the bones, cartilage, muscles, and tendons. Causes for ankle pain include:  Injury, including a break (fracture), sprain, or strain often due to a fall, sports, or a high-impact activity.  Swelling (inflammation) of a tendon (tendonitis).  Achilles tendon rupture.  Ankle instability after repeated sprains and strains.  Poor foot alignment.  Pressure  on a nerve (tarsal tunnel syndrome).  Arthritis in the ankle or the lining of the ankle.  Crystal formation in the ankle (gout or pseudogout). DIAGNOSIS  A diagnosis is based on your medical history, your symptoms, results of your physical exam, and results of diagnostic tests. Diagnostic tests may include X-ray exams or a computerized magnetic scan (magnetic resonance imaging, MRI). TREATMENT  Treatment will depend on the cause of your ankle pain and may include:  Keeping pressure off the ankle and limiting activities.  Using crutches or other walking support (a cane or brace).  Using rest, ice, compression, and elevation.  Participating in physical therapy or home exercises.  Wearing shoe inserts or special shoes.  Losing weight.  Taking medications to reduce pain or swelling or receiving an injection.  Undergoing surgery. HOME CARE INSTRUCTIONS   Only take over-the-counter or prescription medicines for pain, discomfort, or fever as directed by your caregiver.  Put ice on the injured area.  Put ice in a  plastic bag.  Place a towel between your skin and the bag.  Leave the ice on for 15-20 minutes at a time, 03-04 times a day.  Keep your leg raised (elevated) when possible to lessen swelling.  Avoid activities that cause ankle pain.  Follow specific exercises as directed by your caregiver.  Record how often you have ankle pain, the location of the pain, and what it feels like. This information may be helpful to you and your caregiver.  Ask your caregiver about returning to work or sports and whether you should drive.  Follow up with your caregiver for further examination, therapy, or testing as directed. SEEK MEDICAL CARE IF:   Pain or swelling continues or worsens beyond 1 week.  You have an oral temperature above 102 F (38.9 C).  You are feeling unwell or have chills.  You are having an increasingly difficult time with walking.  You have loss of  sensation or other new symptoms.  You have questions or concerns. MAKE SURE YOU:   Understand these instructions.  Will watch your condition.  Will get help right away if you are not doing well or get worse. Document Released: 04/28/2010 Document Revised: 01/31/2012 Document Reviewed: 04/28/2010 [facility] Patient Information 2014 [place].

## 2014-08-29 ENCOUNTER — Emergency Department (HOSPITAL_COMMUNITY)
Admission: EM | Admit: 2014-08-29 | Discharge: 2014-08-29 | Disposition: A | Discharge status: Home / Self Care | Payer: Medicaid Other | Attending: Emergency Medicine | Admitting: Emergency Medicine

## 2014-08-29 ENCOUNTER — Emergency Department (HOSPITAL_COMMUNITY): Payer: Medicaid Other

## 2014-08-29 ENCOUNTER — Encounter (HOSPITAL_COMMUNITY): Payer: Self-pay | Admitting: Emergency Medicine

## 2014-08-29 DIAGNOSIS — M7712 Lateral epicondylitis, left elbow: Secondary | ICD-10-CM

## 2014-08-29 DIAGNOSIS — M25522 Pain in left elbow: Secondary | ICD-10-CM

## 2014-08-29 DIAGNOSIS — Z87828 Personal history of other (healed) physical injury and trauma: Secondary | ICD-10-CM | POA: Insufficient documentation

## 2014-08-29 DIAGNOSIS — Z79899 Other long term (current) drug therapy: Secondary | ICD-10-CM | POA: Insufficient documentation

## 2014-08-29 DIAGNOSIS — M13842 Other specified arthritis, left hand: Secondary | ICD-10-CM | POA: Insufficient documentation

## 2014-08-29 DIAGNOSIS — Z91040 Latex allergy status: Secondary | ICD-10-CM | POA: Insufficient documentation

## 2014-08-29 DIAGNOSIS — M19042 Primary osteoarthritis, left hand: Secondary | ICD-10-CM

## 2014-08-29 MED ORDER — MELOXICAM 7.5 MG PO TABS: 7.5000 mg | ORAL_TABLET | Freq: Every day | ORAL | Status: DC

## 2014-08-29 MED ORDER — TRAMADOL HCL 50 MG PO TABS: 50.0000 mg | ORAL_TABLET | Freq: Four times a day (QID) | ORAL | Status: DC | PRN

## 2014-08-29 NOTE — ED Provider Notes (Signed)
CSN: [id number]     Arrival date & time 08/29/14  2956 History   First MD Initiated Contact with Patient 08/29/14 1001     Chief Complaint  Patient presents with  . Elbow Pain     (Consider location/radiation/quality/duration/timing/severity/associated sxs/prior Treatment) HPI Pt is a 43yo female with c/o intermittent left elbow pain x1 month, minimal relief with Tylenol and ibuprofen. Pt states pain is a dull ache, stabbing and throbbing at times, 8/10 at worst. Worse with heavy lifting and repetitive movements. Pt states last night she attempted to pick up a pot but dropped it due to pain in her elbow. Pt also reports having arthritis in her left hand. States she she is cooking, like stirring or holding a pot, her hand will get stuck in the same position for a few seconds until she massages her hand.  Pt is right hand dominant.  Denies change in skin color, no erythema, ecchymosis or warmth. Denies previous injury or surgery to left elbow or hand.   Past Medical History  Diagnosis Date  . Preterm labor   . History of allergic reaction 02/26/2013  . Torn Achilles tendon    Past Surgical History  Procedure Laterality Date  . No past surgeries     Family History  Problem Relation Age of Onset  . Other Neg Hx   . Cerebral palsy Son     born at [redacted] weeks gestation   History  Substance Use Topics  . Smoking status: Never Smoker   . Smokeless tobacco: Not on file  . Alcohol Use: No   OB History   Grav Para Term Preterm Abortions TAB SAB Ect Mult Living   6 4 3 1 2 1 1   4      Review of Systems  Constitutional: Negative for fever and chills.  Gastrointestinal: Negative for nausea and vomiting.  Musculoskeletal: Positive for arthralgias and myalgias. Negative for back pain, joint swelling, neck pain and neck stiffness.       Left elbow and left hand  Skin: Negative for color change and wound.  All other systems reviewed and are negative.     Allergies  Bee venom;  Hydrocodone-acetaminophen; Latex; and Tomato  Home Medications   Prior to Admission medications   Medication Sig Start Date End Date Taking? Authorizing Provider  ferrous sulfate 325 (65 FE) MG tablet Take 325 mg by mouth daily with breakfast.    Historical Provider, MD  ibuprofen (ADVIL,MOTRIN) 600 MG tablet Take 600-1,200 mg by mouth every 6 (six) hours as needed for headache or mild pain.    Historical Provider, MD  meloxicam (MOBIC) 7.5 MG tablet Take 1 tablet (7.5 mg total) by mouth daily. Take 1-2 tabs daily for 7 days, then daily as needed for pain. 08/29/14   Noland Fordyce, PA-C  traMADol (ULTRAM) 50 MG tablet Take 1 tablet (50 mg total) by mouth every 6 (six) hours as needed. 08/29/14   Noland Fordyce, PA-C   BP 132/68  Pulse 86  Temp(Src) 98.1 F (36.7 C) (Oral)  Resp 16  SpO2 100%  LMP 08/11/2014 Physical Exam  Nursing note and vitals reviewed. Constitutional: She is oriented to person, place, and time. She appears well-developed and well-nourished.  HENT:  Head: Normocephalic and atraumatic.  Eyes: EOM are normal.  Neck: Normal range of motion.  Cardiovascular: Normal rate.   Pulses:      Radial pulses are 2+ on the left side.  Cap refill left hand: <3 seconds  Pulmonary/Chest:  Effort normal.  Musculoskeletal: Normal range of motion. She exhibits tenderness. She exhibits no edema.  Left elbow: FROM , tenderness along the lateral epicondyle and brachioradials musculature. Increased pain with internal and external rotation left lower arm against resistance.  5/5 grip strength bilaterally. FROM left shoulder, wrist and hand.    Neurological: She is alert and oriented to person, place, and time.  Sensation in tact to left arm and hand.  Skin: Skin is warm and dry. No erythema.  Skin in tact. No ecchymosis, erythema, or warmth. No red streaking, induration, or evidence of underlying infection.  Psychiatric: She has a normal mood and affect. Her behavior is normal.    ED  Course  Procedures (including critical care time) Labs Review Labs Reviewed - No data to display  Imaging Review Dg Elbow Complete Left  08/29/2014   CLINICAL DATA:  A initial evaluation for left elbow pain, chronic, no known injury  EXAM: LEFT ELBOW - COMPLETE 3+ VIEW  COMPARISON:  None.  FINDINGS: There is no evidence of fracture, dislocation, or joint effusion. There is no evidence of arthropathy or other focal bone abnormality. Soft tissues are unremarkable.  IMPRESSION: Negative.   Electronically Signed   By: Skipper Cliche M.D.   On: 08/29/2014 09:53     EKG Interpretation None      MDM   Final diagnoses:  Lateral epicondylitis (tennis elbow), left  Left elbow pain  Arthritis of left hand    Pt is a 43yo female presenting to ED with c/o left elbow and hand pain that started 1 month ago.  Hx and exam consistent with arthritis of left hand and lateral epicondylitis of left elbow.  Discussed use of OTC brace for tennis elbow, Rx: tramadol and mobic. Advised to f/u with PCP and [employer] in 1-2 weeks if symptoms not improving. Home care instructions provided. Pt verbalized understanding and agreement with tx plan.     Noland Fordyce, PA-C 08/29/14 1114

## 2014-08-29 NOTE — ED Notes (Signed)
Bed: WTR8 Expected date:  Expected time:  Means of arrival:  Comments: 

## 2014-08-29 NOTE — ED Notes (Signed)
Per pt, states left elbow pain for over a month-no injury-no relief with OTC meds

## 2014-08-30 NOTE — ED Provider Notes (Signed)
Medical screening examination/treatment/procedure(s) were performed by non-physician practitioner and as supervising physician I was immediately available for consultation/collaboration.   EKG Interpretation None        Jekhi Bolin, MD 08/30/14 1257 

## 2014-09-23 ENCOUNTER — Encounter (HOSPITAL_COMMUNITY): Payer: Self-pay | Admitting: Emergency Medicine

## 2014-12-19 ENCOUNTER — Encounter (HOSPITAL_COMMUNITY): Payer: Self-pay | Admitting: *Deleted

## 2014-12-19 ENCOUNTER — Emergency Department (HOSPITAL_COMMUNITY)
Admission: EM | Admit: 2014-12-19 | Discharge: 2014-12-19 | Disposition: A | Discharge status: Home / Self Care | Payer: Medicaid Other | Attending: Emergency Medicine | Admitting: Emergency Medicine

## 2014-12-19 DIAGNOSIS — Z791 Long term (current) use of non-steroidal anti-inflammatories (NSAID): Secondary | ICD-10-CM | POA: Insufficient documentation

## 2014-12-19 DIAGNOSIS — Z79899 Other long term (current) drug therapy: Secondary | ICD-10-CM | POA: Insufficient documentation

## 2014-12-19 DIAGNOSIS — Z91040 Latex allergy status: Secondary | ICD-10-CM | POA: Insufficient documentation

## 2014-12-19 DIAGNOSIS — K0889 Other specified disorders of teeth and supporting structures: Secondary | ICD-10-CM

## 2014-12-19 DIAGNOSIS — Z8751 Personal history of pre-term labor: Secondary | ICD-10-CM | POA: Insufficient documentation

## 2014-12-19 DIAGNOSIS — K088 Other specified disorders of teeth and supporting structures: Secondary | ICD-10-CM | POA: Insufficient documentation

## 2014-12-19 MED ORDER — IBUPROFEN 800 MG PO TABS: 800.0000 mg | ORAL_TABLET | Freq: Three times a day (TID) | ORAL | Status: DC | PRN

## 2014-12-19 MED ORDER — PENICILLIN V POTASSIUM 500 MG PO TABS: 500.0000 mg | ORAL_TABLET | Freq: Three times a day (TID) | ORAL | Status: DC

## 2014-12-19 MED ORDER — HYDROCODONE-ACETAMINOPHEN 5-325 MG PO TABS: 2.0000 | ORAL_TABLET | Freq: Four times a day (QID) | ORAL | Status: DC | PRN

## 2014-12-19 NOTE — ED Notes (Addendum)
Pt reports R upper and lower dental pain x 3 weeks.  Pt reports broken teeth.  States pain is worse when air hits them.  Pt reports taking tramadol twice today without relief.

## 2014-12-19 NOTE — ED Notes (Signed)
See triage note.

## 2014-12-19 NOTE — ED Provider Notes (Signed)
CSN: [id number]     Arrival date & time 12/19/14  1910 History   First MD Initiated Contact with Patient 12/19/14 1912     This chart was scribed for non-physician practitioner, Domenic Moras PA-C working with Blanchie Dessert, MD by Forrestine Him, ED Scribe. This patient was seen in room WTR5/WTR5 and the patient's care was started at 7:21 PM.   No chief complaint on file.  HPI  HPI Comments: Beth Jacobs is a 44 y.o. female who presents to the Emergency Department complaining of constant, moderate, ongoing upper and lower dental pain x 3 weeks that has progressively worsened. Pain is exacerbated with cold air, eating, and chewing. Pt attributes symptoms to a tooth that recently chipped 3 weeks ago. She has tried salt water rinses without any relief. Pt has also tried Tramadol and Ibuprofen without any improvement for discomfort. No recent fever or chills.  Past Medical History  Diagnosis Date  . Preterm labor   . History of allergic reaction 02/26/2013  . Torn Achilles tendon    Past Surgical History  Procedure Laterality Date  . No past surgeries     Family History  Problem Relation Age of Onset  . Other Neg Hx   . Cerebral palsy Son     born at [redacted] weeks gestation   History  Substance Use Topics  . Smoking status: Never Smoker   . Smokeless tobacco: Not on file  . Alcohol Use: No   OB History    Gravida Para Term Preterm AB TAB SAB Ectopic Multiple Living   6 4 3 1 2 1 1   4      Review of Systems  Constitutional: Negative for fever and chills.  HENT: Positive for dental problem.       Allergies  Bee venom; Hydrocodone-acetaminophen; Latex; and Tomato  Home Medications   Prior to Admission medications   Medication Sig Start Date End Date Taking? Authorizing Provider  ferrous sulfate 325 (65 FE) MG tablet Take 325 mg by mouth daily with breakfast.    Historical Provider, MD  ibuprofen (ADVIL,MOTRIN) 600 MG tablet Take 600-1,200 mg by mouth every 6 (six) hours as  needed for headache or mild pain.    Historical Provider, MD  meloxicam (MOBIC) 7.5 MG tablet Take 1 tablet (7.5 mg total) by mouth daily. Take 1-2 tabs daily for 7 days, then daily as needed for pain. 08/29/14   Noland Fordyce, PA-C  traMADol (ULTRAM) 50 MG tablet Take 1 tablet (50 mg total) by mouth every 6 (six) hours as needed. 08/29/14   Noland Fordyce, PA-C   Triage Vitals: There were no vitals taken for this visit.   Physical Exam  Constitutional: She is oriented to person, place, and time. She appears well-developed and well-nourished.  HENT:  Head: Normocephalic.  Partial dental fracture involving 30% of tooth number 2 with tenderness to palpation  No obvious abscess noted No trismus Tooth number 30 with tenderness to palpation without any obvious abscess  Eyes: EOM are normal.  Neck: Normal range of motion.  Cardiovascular: Normal rate, regular rhythm and normal heart sounds.   Pulmonary/Chest: Effort normal.  Abdominal: She exhibits no distension.  Musculoskeletal: Normal range of motion.  Lymphadenopathy:    She has no cervical adenopathy.  Neurological: She is alert and oriented to person, place, and time.  Psychiatric: She has a normal mood and affect.  Nursing note and vitals reviewed.   ED Course  Procedures (including critical care time)  DIAGNOSTIC  STUDIES: Oxygen Saturation is 100% on room air, normal by my interpretation.    COORDINATION OF CARE: 7:22 PM- Will give Penicillin, Ibuprofen, and Vicodin to help manage symptoms. Advised pt to follow up with a dentist. Discussed treatment plan with pt at bedside and pt agreed to plan.     Labs Review Labs Reviewed - No data to display  Imaging Review No results found.   EKG Interpretation None      MDM   Final diagnoses:  Pain, dental    BP 123/89 mmHg  Pulse 77  Temp(Src) 98.9 F (37.2 C)  Resp 16  SpO2 100%  LMP 12/18/2014   I personally performed the services described in this documentation,  which was scribed in my presence. The recorded information has been reviewed and is accurate.    Domenic Moras, PA-C 12/19/14 1929  Debby Freiberg, MD 12/20/14 [phone]

## 2014-12-19 NOTE — Discharge Instructions (Signed)

## 2015-02-19 ENCOUNTER — Encounter (HOSPITAL_COMMUNITY): Payer: Self-pay | Admitting: Emergency Medicine

## 2015-02-19 ENCOUNTER — Emergency Department (HOSPITAL_COMMUNITY)
Admission: EM | Admit: 2015-02-19 | Discharge: 2015-02-19 | Disposition: A | Discharge status: Home / Self Care | Payer: Medicaid Other | Attending: Emergency Medicine | Admitting: Emergency Medicine

## 2015-02-19 DIAGNOSIS — Z87828 Personal history of other (healed) physical injury and trauma: Secondary | ICD-10-CM | POA: Insufficient documentation

## 2015-02-19 DIAGNOSIS — Z792 Long term (current) use of antibiotics: Secondary | ICD-10-CM | POA: Insufficient documentation

## 2015-02-19 DIAGNOSIS — K088 Other specified disorders of teeth and supporting structures: Secondary | ICD-10-CM | POA: Insufficient documentation

## 2015-02-19 DIAGNOSIS — Z79899 Other long term (current) drug therapy: Secondary | ICD-10-CM | POA: Insufficient documentation

## 2015-02-19 DIAGNOSIS — K0381 Cracked tooth: Secondary | ICD-10-CM | POA: Insufficient documentation

## 2015-02-19 DIAGNOSIS — Z91040 Latex allergy status: Secondary | ICD-10-CM | POA: Insufficient documentation

## 2015-02-19 DIAGNOSIS — Z791 Long term (current) use of non-steroidal anti-inflammatories (NSAID): Secondary | ICD-10-CM | POA: Insufficient documentation

## 2015-02-19 DIAGNOSIS — K0889 Other specified disorders of teeth and supporting structures: Secondary | ICD-10-CM

## 2015-02-19 MED ORDER — BUPIVACAINE HCL 0.5 % IJ SOLN
1.8000 mL | Freq: Once | INTRAMUSCULAR | Status: DC
  Filled 2015-02-19: qty 1.8

## 2015-02-19 MED ORDER — TRAMADOL HCL 50 MG PO TABS: 50.0000 mg | ORAL_TABLET | Freq: Four times a day (QID) | ORAL | Status: DC | PRN

## 2015-02-19 MED ORDER — BUPIVACAINE-EPINEPHRINE (PF) 0.5% -1:200000 IJ SOLN
1.8000 mL | Freq: Once | INTRAMUSCULAR | Status: AC
  Administered 2015-02-19: 1.8 mL
  Filled 2015-02-19: qty 1.8

## 2015-02-19 NOTE — ED Provider Notes (Signed)
CSN: [id number]     Arrival date & time 02/19/15  1621 History   First MD Initiated Contact with Patient 02/19/15 1653     Chief Complaint  Patient presents with  . Dental Pain    Patient is a 44 y.o. female presenting with tooth pain. The history is provided by the patient. No language interpreter was used.  Dental Pain  This chart was scribed for non-physician practitioner Lucien Mons, PA-C, working with Blanchie Dessert, MD, by Thea Alken, ED Scribe. This patient was seen in room WTR8/WTR8 and the patient's care was started at 14:35 PM.  Beth Jacobs is a 44 y.o. female who presents to the Emergency Department complaining of intermittent, worsening right lower dental pain that began 4 months. Pt states dental pain worsened 2 days ago and reports she is currently in the worse pain that she has ever felt. Pt rates dental pain 10/10. She reports trouble sleeping last night due to the pain. Pt has tried ibuprofen and extra strength tylenol without relief to pain as well as hot and cold compresses. She states she currently does not have insurance to be seen by a dentist. Pt denies fever and difficulty swallowing.     Past Medical History  Diagnosis Date  . Preterm labor   . History of allergic reaction 02/26/2013  . Torn Achilles tendon    Past Surgical History  Procedure Laterality Date  . No past surgeries    . Dilation and evacuation    . Wisdom tooth extraction     Family History  Problem Relation Age of Onset  . Other Neg Hx   . Cerebral palsy Son     born at [redacted] weeks gestation   History  Substance Use Topics  . Smoking status: Never Smoker   . Smokeless tobacco: Not on file  . Alcohol Use: No   OB History    Gravida Para Term Preterm AB TAB SAB Ectopic Multiple Living   6 4 3 1 2 1 1   4      Review of Systems  HENT: Positive for dental problem.   All other systems reviewed and are negative.  Allergies  Bee venom; Hydrocodone-acetaminophen; Latex; and Tomato  Home  Medications   Prior to Admission medications   Medication Sig Start Date End Date Taking? Authorizing Provider  acetaminophen (TYLENOL) 500 MG tablet Take 1,000 mg by mouth every 6 (six) hours as needed for moderate pain (pain).   Yes Historical Provider, MD  ferrous sulfate 325 (65 FE) MG tablet Take 325 mg by mouth daily with breakfast.   Yes Historical Provider, MD  ibuprofen (ADVIL,MOTRIN) 800 MG tablet Take 1 tablet (800 mg total) by mouth every 8 (eight) hours as needed for moderate pain. 12/19/14  Yes Domenic Moras, PA-C  HYDROcodone-acetaminophen (NORCO/VICODIN) 5-325 MG per tablet Take 2 tablets by mouth every 6 (six) hours as needed for moderate pain or severe pain. Patient not taking: Reported on 02/19/2015 12/19/14   Domenic Moras, PA-C  meloxicam (MOBIC) 7.5 MG tablet Take 1 tablet (7.5 mg total) by mouth daily. Take 1-2 tabs daily for 7 days, then daily as needed for pain. Patient not taking: Reported on 02/19/2015 08/29/14   Noland Fordyce, PA-C  penicillin v potassium (VEETID) 500 MG tablet Take 1 tablet (500 mg total) by mouth 3 (three) times daily. Patient not taking: Reported on 02/19/2015 12/19/14   Domenic Moras, PA-C  traMADol (ULTRAM) 50 MG tablet Take 1 tablet (50 mg total) by  mouth every 6 (six) hours as needed. Patient not taking: Reported on 02/19/2015 08/29/14   Noland Fordyce, PA-C  traMADol (ULTRAM) 50 MG tablet Take 1 tablet (50 mg total) by mouth every 6 (six) hours as needed. 02/19/15   Allis Quirarte M Lashea Goda, PA-C   BP 138/98 mmHg  Pulse 93  Temp(Src) 98.6 F (37 C) (Oral)  Resp 18  SpO2 98% Physical Exam  Constitutional: She is oriented to person, place, and time. She appears well-developed and well-nourished. No distress.  HENT:  Head: Normocephalic and atraumatic.  Mouth/Throat: Oropharynx is clear and moist.    Eyes: Conjunctivae and EOM are normal.  Neck: Normal range of motion. Neck supple.  Cardiovascular: Normal rate, regular rhythm and normal heart sounds.    Pulmonary/Chest: Effort normal and breath sounds normal. No respiratory distress.  Musculoskeletal: Normal range of motion. She exhibits no edema.  Neurological: She is alert and oriented to person, place, and time. No sensory deficit.  Skin: Skin is warm and dry.  Psychiatric: She has a normal mood and affect. Her behavior is normal.  Nursing note and vitals reviewed.   ED Course  NERVE BLOCK Date/Time: 02/19/2015 5:26 PM Performed by: Carman Ching Authorized by: Carman Ching Consent: Verbal consent obtained. Risks and benefits: risks, benefits and alternatives were discussed Consent given by: patient Indications: pain relief Body area: face/mouth Nerve: inferior alveolar Laterality: right Patient sedated: no Preparation: Patient was prepped and draped in the usual sterile fashion. Patient position: supine Needle gauge: 28 G Location technique: anatomical landmarks Local anesthetic: bupivacaine 0.5% with epinephrine Anesthetic total: 1.8 ml Outcome: pain improved Patient tolerance: Patient tolerated the procedure well with no immediate complications   (including critical care time) DIAGNOSTIC STUDIES: Oxygen Saturation is 98% on RA, normal by my interpretation.    COORDINATION OF CARE: 5:35 PM- Pt advised of plan for treatment and pt agrees. Labs Review Labs Reviewed - No data to display  Imaging Review No results found.   EKG Interpretation None      MDM   Final diagnoses:  Pain, dental   Dental pain without associated infection. No dental abscess or signs of Ludwig angina. Dental block completed with relief of pain. Resources given for dental follow-up. Rx tramadol for pain. Stable for discharge. Return precautions given. Patient states understanding of treatment care plan and is agreeable.  I personally performed the services described in this documentation, which was scribed in my presence. The recorded information has been reviewed and is  accurate.  Carman Ching, PA-C 02/19/15 1735  Blanchie Dessert, MD 02/20/15 [phone]

## 2015-02-19 NOTE — ED Notes (Signed)
Pt c/o right lower dental pain since Monday.  Pt states making her whole face hurt.

## 2015-02-19 NOTE — Discharge Instructions (Signed)
Take tramadol as directed for pain. Follow up with the dentist. Call within 48 hours to schedule an appointment.  Dental Pain A tooth ache may be caused by cavities (tooth decay). Cavities expose the nerve of the tooth to air and hot or cold temperatures. It may come from an infection or abscess (also called a boil or furuncle) around your tooth. It is also often caused by dental caries (tooth decay). This causes the pain you are having. DIAGNOSIS  Your caregiver can diagnose this problem by exam. TREATMENT   If caused by an infection, it may be treated with medications which kill germs (antibiotics) and pain medications as prescribed by your caregiver. Take medications as directed.  Only take over-the-counter or prescription medicines for pain, discomfort, or fever as directed by your caregiver.  Whether the tooth ache today is caused by infection or dental disease, you should see your dentist as soon as possible for further care. SEEK MEDICAL CARE IF: The exam and treatment you received today has been provided on an emergency basis only. This is not a substitute for complete medical or dental care. If your problem worsens or new problems (symptoms) appear, and you are unable to meet with your dentist, call or return to this location. SEEK IMMEDIATE MEDICAL CARE IF:   You have a fever.  You develop redness and swelling of your face, jaw, or neck.  You are unable to open your mouth.  You have severe pain uncontrolled by pain medicine. MAKE SURE YOU:   Understand these instructions.  Will watch your condition.  Will get help right away if you are not doing well or get worse. Document Released: 11/08/2005 Document Revised: 01/31/2012 Document Reviewed: 06/26/2008 [facility] Patient Information 2015 [place], [place]. This information is not intended to replace advice given to you by your health care provider. Make sure you discuss any questions you have with your health care provider.

## 2015-02-20 ENCOUNTER — Encounter (HOSPITAL_COMMUNITY): Payer: Self-pay

## 2015-02-20 ENCOUNTER — Emergency Department (HOSPITAL_COMMUNITY)
Admission: EM | Admit: 2015-02-20 | Discharge: 2015-02-20 | Disposition: A | Discharge status: Home / Self Care | Payer: Medicaid Other | Attending: Emergency Medicine | Admitting: Emergency Medicine

## 2015-02-20 DIAGNOSIS — Z87828 Personal history of other (healed) physical injury and trauma: Secondary | ICD-10-CM | POA: Insufficient documentation

## 2015-02-20 DIAGNOSIS — K047 Periapical abscess without sinus: Secondary | ICD-10-CM

## 2015-02-20 DIAGNOSIS — Z79899 Other long term (current) drug therapy: Secondary | ICD-10-CM | POA: Insufficient documentation

## 2015-02-20 DIAGNOSIS — Z91040 Latex allergy status: Secondary | ICD-10-CM | POA: Insufficient documentation

## 2015-02-20 DIAGNOSIS — Z791 Long term (current) use of non-steroidal anti-inflammatories (NSAID): Secondary | ICD-10-CM | POA: Insufficient documentation

## 2015-02-20 DIAGNOSIS — K029 Dental caries, unspecified: Secondary | ICD-10-CM | POA: Insufficient documentation

## 2015-02-20 MED ORDER — PENICILLIN V POTASSIUM 500 MG PO TABS
500.0000 mg | ORAL_TABLET | Freq: Once | ORAL | Status: AC
  Administered 2015-02-20: 500 mg via ORAL
  Filled 2015-02-20: qty 1

## 2015-02-20 MED ORDER — HYDROMORPHONE HCL 1 MG/ML IJ SOLN
0.5000 mg | Freq: Once | INTRAMUSCULAR | Status: AC
  Administered 2015-02-20: 0.5 mg via INTRAMUSCULAR
  Filled 2015-02-20: qty 1

## 2015-02-20 MED ORDER — KETOROLAC TROMETHAMINE 30 MG/ML IJ SOLN
30.0000 mg | Freq: Once | INTRAMUSCULAR | Status: AC
  Administered 2015-02-20: 30 mg via INTRAMUSCULAR
  Filled 2015-02-20: qty 1

## 2015-02-20 MED ORDER — PENICILLIN V POTASSIUM 500 MG PO TABS: 500.0000 mg | ORAL_TABLET | Freq: Four times a day (QID) | ORAL | Status: DC

## 2015-02-20 MED ORDER — OXYCODONE-ACETAMINOPHEN 5-325 MG PO TABS: 1.0000 | ORAL_TABLET | Freq: Four times a day (QID) | ORAL | Status: DC | PRN

## 2015-02-20 NOTE — ED Notes (Signed)
Patient verbalizes understanding of discharge instructions, prescription medications, home care, and follow up care. Patient ambulatory out of department at this time with family

## 2015-02-20 NOTE — ED Provider Notes (Signed)
CSN: [id number]     Arrival date & time 02/20/15  2633 History   First MD Initiated Contact with Patient 02/20/15 [phone]     Chief Complaint  Patient presents with  . Dental Pain     (Consider location/radiation/quality/duration/timing/severity/associated sxs/prior Treatment) Patient is a 44 y.o. female presenting with tooth pain. The history is provided by the patient.  Dental Pain Location:  Lower Lower teeth location:  31/RL 2nd molar Quality:  Pulsating Severity:  Mild Onset quality:  Gradual Timing:  Constant Progression:  Worsening Chronicity:  Recurrent Context: dental caries   Relieved by:  Nothing Worsened by:  Nothing tried Ineffective treatments: ultram. Associated symptoms: facial swelling   Associated symptoms: no fever     Past Medical History  Diagnosis Date  . Preterm labor   . History of allergic reaction 02/26/2013  . Torn Achilles tendon    Past Surgical History  Procedure Laterality Date  . No past surgeries    . Dilation and evacuation    . Wisdom tooth extraction     Family History  Problem Relation Age of Onset  . Other Neg Hx   . Cerebral palsy Son     born at [redacted] weeks gestation   History  Substance Use Topics  . Smoking status: Never Smoker   . Smokeless tobacco: Not on file  . Alcohol Use: No   OB History    Gravida Para Term Preterm AB TAB SAB Ectopic Multiple Living   6 4 3 1 2 1 1   4      Review of Systems  Constitutional: Negative for fever.  HENT: Positive for dental problem and facial swelling.   All other systems reviewed and are negative.     Allergies  Bee venom; Hydrocodone-acetaminophen; Latex; and Tomato  Home Medications   Prior to Admission medications   Medication Sig Start Date End Date Taking? Authorizing Provider  acetaminophen (TYLENOL) 500 MG tablet Take 1,000 mg by mouth every 6 (six) hours as needed for moderate pain (pain).   Yes Historical Provider, MD  ferrous sulfate 325 (65 FE) MG tablet Take 325  mg by mouth daily with breakfast.   Yes Historical Provider, MD  ibuprofen (ADVIL,MOTRIN) 800 MG tablet Take 1 tablet (800 mg total) by mouth every 8 (eight) hours as needed for moderate pain. 12/19/14  Yes Domenic Moras, PA-C  traMADol (ULTRAM) 50 MG tablet Take 1 tablet (50 mg total) by mouth every 6 (six) hours as needed. 02/19/15  Yes Robyn M Hess, PA-C  HYDROcodone-acetaminophen (NORCO/VICODIN) 5-325 MG per tablet Take 2 tablets by mouth every 6 (six) hours as needed for moderate pain or severe pain. Patient not taking: Reported on 02/19/2015 12/19/14   Domenic Moras, PA-C  meloxicam (MOBIC) 7.5 MG tablet Take 1 tablet (7.5 mg total) by mouth daily. Take 1-2 tabs daily for 7 days, then daily as needed for pain. Patient not taking: Reported on 02/19/2015 08/29/14   Noland Fordyce, PA-C  oxyCODONE-acetaminophen (PERCOCET/ROXICET) 5-325 MG per tablet Take 1 tablet by mouth every 6 (six) hours as needed for severe pain. 02/20/15   Junius Creamer, NP  penicillin v potassium (VEETID) 500 MG tablet Take 1 tablet (500 mg total) by mouth 4 (four) times daily. 02/20/15   Junius Creamer, NP  traMADol (ULTRAM) 50 MG tablet Take 1 tablet (50 mg total) by mouth every 6 (six) hours as needed. Patient not taking: Reported on 02/19/2015 08/29/14   Noland Fordyce, PA-C   BP 133/82 mmHg  Pulse 102  Temp(Src) 98.1 F (36.7 C) (Oral)  Resp 18  SpO2 98%  LMP 02/04/2015 Physical Exam  Constitutional: She appears well-developed and well-nourished.  HENT:  Head: Normocephalic.  Right Ear: External ear normal.  Left Ear: External ear normal.  Mouth/Throat: Dental caries present.    Eyes: Pupils are equal, round, and reactive to light.  Neck: Normal range of motion.  Cardiovascular: Normal rate.   Pulmonary/Chest: Effort normal.  Abdominal: Soft.  Vitals reviewed.   ED Course  Procedures (including critical care time) Labs Review Labs Reviewed - No data to display  Imaging Review No results found.   EKG  Interpretation None      MDM   Final diagnoses:  Dental cavity  Dental abscess         Junius Creamer, NP 02/20/15 [place], MD 02/20/15 [phone]

## 2015-02-20 NOTE — ED Notes (Signed)
Pt was seen for the same yesterday, she's on her third dose of tramadol and the lidocaine is wearing off, pt states that she can't sleep.

## 2015-04-04 ENCOUNTER — Emergency Department (HOSPITAL_COMMUNITY)
Admission: EM | Admit: 2015-04-04 | Discharge: 2015-04-04 | Disposition: A | Discharge status: Home / Self Care | Payer: Medicaid Other | Attending: Emergency Medicine | Admitting: Emergency Medicine

## 2015-04-04 ENCOUNTER — Emergency Department (HOSPITAL_COMMUNITY): Payer: Medicaid Other

## 2015-04-04 ENCOUNTER — Encounter (HOSPITAL_COMMUNITY): Payer: Self-pay | Admitting: Emergency Medicine

## 2015-04-04 DIAGNOSIS — Y998 Other external cause status: Secondary | ICD-10-CM | POA: Insufficient documentation

## 2015-04-04 DIAGNOSIS — Y92093 Driveway of other non-institutional residence as the place of occurrence of the external cause: Secondary | ICD-10-CM | POA: Insufficient documentation

## 2015-04-04 DIAGNOSIS — S3992XA Unspecified injury of lower back, initial encounter: Secondary | ICD-10-CM | POA: Insufficient documentation

## 2015-04-04 DIAGNOSIS — Z791 Long term (current) use of non-steroidal anti-inflammatories (NSAID): Secondary | ICD-10-CM | POA: Insufficient documentation

## 2015-04-04 DIAGNOSIS — Z91040 Latex allergy status: Secondary | ICD-10-CM | POA: Insufficient documentation

## 2015-04-04 DIAGNOSIS — Y9389 Activity, other specified: Secondary | ICD-10-CM | POA: Insufficient documentation

## 2015-04-04 DIAGNOSIS — S0083XA Contusion of other part of head, initial encounter: Secondary | ICD-10-CM

## 2015-04-04 DIAGNOSIS — Z792 Long term (current) use of antibiotics: Secondary | ICD-10-CM | POA: Insufficient documentation

## 2015-04-04 DIAGNOSIS — Z79899 Other long term (current) drug therapy: Secondary | ICD-10-CM | POA: Insufficient documentation

## 2015-04-04 DIAGNOSIS — S40012A Contusion of left shoulder, initial encounter: Secondary | ICD-10-CM | POA: Insufficient documentation

## 2015-04-04 MED ORDER — TRAMADOL HCL 50 MG PO TABS
50.0000 mg | ORAL_TABLET | Freq: Once | ORAL | Status: AC
  Administered 2015-04-04: 50 mg via ORAL
  Filled 2015-04-04: qty 1

## 2015-04-04 MED ORDER — DIAZEPAM 5 MG PO TABS: 5.0000 mg | ORAL_TABLET | Freq: Two times a day (BID) | ORAL | Status: DC

## 2015-04-04 MED ORDER — NAPROXEN 500 MG PO TABS: 500.0000 mg | ORAL_TABLET | Freq: Two times a day (BID) | ORAL | Status: DC

## 2015-04-04 MED ORDER — TRAMADOL HCL 50 MG PO TABS: 50.0000 mg | ORAL_TABLET | Freq: Four times a day (QID) | ORAL | Status: DC | PRN

## 2015-04-04 MED ORDER — DIAZEPAM 5 MG PO TABS
5.0000 mg | ORAL_TABLET | Freq: Once | ORAL | Status: AC
  Administered 2015-04-04: 5 mg via ORAL
  Filled 2015-04-04: qty 1
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
## 2015-04-04 NOTE — Discharge Instructions (Signed)
Recommend alternating ice and heat to areas of injury 3-4 times per day. Take naproxen and Valium as prescribed. Take tramadol as needed for severe pain. Be sure to stretch your shoulder multiple times during the day to prevent "frozen shoulder", also known as adhesive capsulitis. Follow-up with your primary doctor for a recheck of symptoms, to ensure proper healing.  Rotator Cuff Injury Rotator cuff injury is any type of injury to the set of muscles and tendons that make up the stabilizing unit of your shoulder. This unit holds the ball of your upper arm bone (humerus) in the socket of your shoulder blade (scapula).  CAUSES Injuries to your rotator cuff most commonly come from sports or activities that cause your arm to be moved repeatedly over your head. Examples of this include throwing, weight lifting, swimming, or racquet sports. Long lasting (chronic) irritation of your rotator cuff can cause soreness and swelling (inflammation), bursitis, and eventual damage to your tendons, such as a tear (rupture). SIGNS AND SYMPTOMS Acute rotator cuff tear:  Sudden tearing sensation followed by severe pain shooting from your upper shoulder down your arm toward your elbow.  Decreased range of motion of your shoulder because of pain and muscle spasm.  Severe pain.  Inability to raise your arm out to the side because of pain and loss of muscle power (large tears). Chronic rotator cuff tear:  Pain that usually is worse at night and may interfere with sleep.  Gradual weakness and decreased shoulder motion as the pain worsens.  Decreased range of motion. Rotator cuff tendinitis:  Deep ache in your shoulder and the outside upper arm over your shoulder.  Pain that comes on gradually and becomes worse when lifting your arm to the side or turning it inward. DIAGNOSIS Rotator cuff injury is diagnosed through a medical history, physical exam, and imaging exam. The medical history helps determine the type  of rotator cuff injury. Your health care provider will look at your injured shoulder, feel the injured area, and ask you to move your shoulder in different positions. X-ray exams typically are done to rule out other causes of shoulder pain, such as fractures. MRI is the exam of choice for the most severe shoulder injuries because the images show muscles and tendons.  TREATMENT  Chronic tear:  Medicine for pain, such as acetaminophen or ibuprofen.  Physical therapy and range-of-motion exercises may be helpful in maintaining shoulder function and strength.  Steroid injections into your shoulder joint.  Surgical repair of the rotator cuff if the injury does not heal with noninvasive treatment. Acute tear:  Anti-inflammatory medicines such as ibuprofen and naproxen to help reduce pain and swelling.  A sling to help support your arm and rest your rotator cuff muscles. Long-term use of a sling is not advised. It may cause significant stiffening of the shoulder joint.  Surgery may be considered within a few weeks, especially in younger, active people, to return the shoulder to full function.  Indications for surgical treatment include the following:  Age younger than 39 years.  Rotator cuff tears that are complete.  Physical therapy, rest, and anti-inflammatory medicines have been used for 6-8 weeks, with no improvement.  Employment or sporting activity that requires constant shoulder use. Tendinitis:  Anti-inflammatory medicines such as ibuprofen and naproxen to help reduce pain and swelling.  A sling to help support your arm and rest your rotator cuff muscles. Long-term use of a sling is not advised. It may cause significant stiffening of the shoulder  joint.  Severe tendinitis may require:  Steroid injections into your shoulder joint.  Physical therapy.  Surgery. HOME CARE INSTRUCTIONS   Apply ice to your injury:  Put ice in a plastic bag.  Place a towel between your skin  and the bag.  Leave the ice on for 20 minutes, 2-3 times a day.  If you have a shoulder immobilizer (sling and straps), wear it until told otherwise by your health care provider.  You may want to sleep on several pillows or in a recliner at night to lessen swelling and pain.  Only take over-the-counter or prescription medicines for pain, discomfort, or fever as directed by your health care provider.  Do simple hand squeezing exercises with a soft rubber ball to decrease hand swelling. SEEK MEDICAL CARE IF:   Your shoulder pain increases, or new pain or numbness develops in your arm, hand, or fingers.  Your hand or fingers are colder than your other hand. SEEK IMMEDIATE MEDICAL CARE IF:   Your arm, hand, or fingers are numb or tingling.  Your arm, hand, or fingers are increasingly swollen and painful, or they turn [place] or blue. MAKE SURE YOU:  Understand these instructions.  Will watch your condition.  Will get help right away if you are not doing well or get worse. Document Released: 11/05/2000 Document Revised: 11/13/2013 Document Reviewed: 06/20/2013 [facility] Patient Information 2015 [place], [place]. This information is not intended to replace advice given to you by your health care provider. Make sure you discuss any questions you have with your health care provider.

## 2015-04-21 ENCOUNTER — Emergency Department (HOSPITAL_COMMUNITY)
Admission: EM | Admit: 2015-04-21 | Discharge: 2015-04-21 | Disposition: A | Discharge status: Home / Self Care | Payer: Medicaid Other | Attending: Emergency Medicine | Admitting: Emergency Medicine

## 2015-04-21 ENCOUNTER — Encounter (HOSPITAL_COMMUNITY): Payer: Self-pay

## 2015-04-21 DIAGNOSIS — F1410 Cocaine abuse, uncomplicated: Secondary | ICD-10-CM

## 2015-04-21 DIAGNOSIS — Z91040 Latex allergy status: Secondary | ICD-10-CM | POA: Insufficient documentation

## 2015-04-21 DIAGNOSIS — Z79899 Other long term (current) drug therapy: Secondary | ICD-10-CM | POA: Insufficient documentation

## 2015-04-21 DIAGNOSIS — Z87828 Personal history of other (healed) physical injury and trauma: Secondary | ICD-10-CM | POA: Insufficient documentation

## 2015-04-21 DIAGNOSIS — Z8751 Personal history of pre-term labor: Secondary | ICD-10-CM | POA: Insufficient documentation

## 2015-04-21 NOTE — ED Provider Notes (Signed)
CSN: [id number]     Arrival date & time 04/21/15  1700 History   First MD Initiated Contact with Patient 04/21/15 1804     Chief Complaint  Patient presents with  . Cocaine Detox      (Consider location/radiation/quality/duration/timing/severity/associated sxs/prior Treatment) HPI Comments: Patient presents requesting cocaine detox. She states that she uses cocaine intermittently. It varies from daily to once a week. She denies any other drug use. She denies any alcohol use. She states that she smokes the cocaine. She last used this morning. She states that she wants to stop and would [facility] and is requesting a referral. She denies any suicidal or homicidal ideations. She denies any hallucinations. She denies any physical complaints. She denies any chest pain or shortness of breath.   Past Medical History  Diagnosis Date  . Preterm labor   . History of allergic reaction 02/26/2013  . Torn Achilles tendon    Past Surgical History  Procedure Laterality Date  . No past surgeries    . Dilation and evacuation    . Wisdom tooth extraction     Family History  Problem Relation Age of Onset  . Other Neg Hx   . Cerebral palsy Son     born at [redacted] weeks gestation   History  Substance Use Topics  . Smoking status: Never Smoker   . Smokeless tobacco: Not on file  . Alcohol Use: No   OB History    Gravida Para Term Preterm AB TAB SAB Ectopic Multiple Living   6 4 3 1 2 1 1   4      Review of Systems  Constitutional: Positive for fatigue. Negative for fever, chills and diaphoresis.  HENT: Negative for congestion, rhinorrhea and sneezing.   Eyes: Negative.   Respiratory: Negative for cough, chest tightness and shortness of breath.   Cardiovascular: Negative for chest pain and leg swelling.  Gastrointestinal: Negative for nausea, vomiting, abdominal pain, diarrhea and blood in stool.  Genitourinary: Negative for frequency, hematuria, flank pain and difficulty urinating.   Musculoskeletal: Negative for back pain and arthralgias.  Skin: Negative for rash.  Neurological: Negative for dizziness, speech difficulty, weakness, numbness and headaches.      Allergies  Bee venom; Hydrocodone-acetaminophen; Latex; and Tomato  Home Medications   Prior to Admission medications   Medication Sig Start Date End Date Taking? Authorizing Provider  acetaminophen (TYLENOL) 500 MG tablet Take 1,000 mg by mouth every 6 (six) hours as needed for moderate pain (pain).   Yes Historical Provider, MD  diazepam (VALIUM) 5 MG tablet Take 1 tablet (5 mg total) by mouth 2 (two) times daily. Patient not taking: Reported on 04/21/2015 04/04/15   Antonietta Breach, PA-C  HYDROcodone-acetaminophen (NORCO/VICODIN) 5-325 MG per tablet Take 2 tablets by mouth every 6 (six) hours as needed for moderate pain or severe pain. Patient not taking: Reported on 02/19/2015 12/19/14   Domenic Moras, PA-C  ibuprofen (ADVIL,MOTRIN) 800 MG tablet Take 1 tablet (800 mg total) by mouth every 8 (eight) hours as needed for moderate pain. Patient not taking: Reported on 04/21/2015 12/19/14   Domenic Moras, PA-C  meloxicam (MOBIC) 7.5 MG tablet Take 1 tablet (7.5 mg total) by mouth daily. Take 1-2 tabs daily for 7 days, then daily as needed for pain. Patient not taking: Reported on 02/19/2015 08/29/14   Noland Fordyce, PA-C  naproxen (NAPROSYN) 500 MG tablet Take 1 tablet (500 mg total) by mouth 2 (two) times daily. Patient not  taking: Reported on 04/21/2015 04/04/15   Antonietta Breach, PA-C  oxyCODONE-acetaminophen (PERCOCET/ROXICET) 5-325 MG per tablet Take 1 tablet by mouth every 6 (six) hours as needed for severe pain. Patient not taking: Reported on 04/21/2015 02/20/15   Junius Creamer, NP  penicillin v potassium (VEETID) 500 MG tablet Take 1 tablet (500 mg total) by mouth 4 (four) times daily. Patient not taking: Reported on 04/21/2015 02/20/15   Junius Creamer, NP  traMADol (ULTRAM) 50 MG tablet Take 1 tablet (50 mg total) by mouth every 6  (six) hours as needed. Patient not taking: Reported on 04/21/2015 04/04/15   Antonietta Breach, PA-C   BP 135/78 mmHg  Pulse 108  Temp(Src) 98.2 F (36.8 C) (Oral)  Resp 18  SpO2 95%  LMP 03/29/2015 Physical Exam  Constitutional: She is oriented to person, place, and time. She appears well-developed and well-nourished.  HENT:  Head: Normocephalic and atraumatic.  Eyes: Pupils are equal, round, and reactive to light.  Neck: Normal range of motion. Neck supple.  Cardiovascular: Normal rate, regular rhythm and normal heart sounds.   Pulmonary/Chest: Effort normal and breath sounds normal. No respiratory distress. She has no wheezes. She has no rales. She exhibits no tenderness.  Abdominal: Soft. Bowel sounds are normal. There is no tenderness. There is no rebound and no guarding.  Musculoskeletal: Normal range of motion. She exhibits no edema.  Lymphadenopathy:    She has no cervical adenopathy.  Neurological: She is alert and oriented to person, place, and time.  Skin: Skin is warm and dry. No rash noted.  Psychiatric: She has a normal mood and affect.    ED Course  Procedures (including critical care time) Labs Review Labs Reviewed - No data to display  Imaging Review No results found.   EKG Interpretation None      MDM   Final diagnoses:  Cocaine abuse    Patient was initially mildly tachycardic with a heart rate of 108. On my exam her heart rate was in the low 90s. She denies any chest pain palpitations or shortness of breath. She denies any suicide or homicide ideations that would warrant TTS consult today. She was discharged home in good condition and given outpatient referral for substance abuse treatment resources.    Malvin Johns, MD 04/21/15 [phone]

## 2015-04-21 NOTE — ED Notes (Signed)
Pt presents wanting cocaine detox.  Denies SI/HI/AV.  Pt reports last use was this morning.  Sts "it was just a little."  Pt is very restless and agitated.

## 2015-04-21 NOTE — Discharge Instructions (Signed)
Stimulant Use Disorder-Cocaine Cocaine is one of a group of powerful drugs called stimulants. Cocaine has medical uses for stopping nosebleeds and for pain control before minor nose or dental surgery. However, cocaine is misused because of the effects that it produces. These effects include:   A feeling of extreme pleasure.  Alertness.  High energy. Common street names for cocaine include coke, crack, blow, snow, and nose candy. Cocaine is snorted, dissolved in water and injected, or smoked.  Stimulants are addictive because they activate regions of the brain that produce both the pleasurable sensation of "reward" and psychological dependence. Together, these actions account for loss of control and the rapid development of drug dependence. This means you become ill without the drug (withdrawal) and need to keep using it to function.  Stimulant use disorder is use of stimulants that disrupts your daily life. It disrupts relationships with family and friends and how you do your job. Cocaine increases your blood pressure and heart rate. It can cause a heart attack or stroke. Cocaine can also cause death from irregular heart rate or seizures. SYMPTOMS Symptoms of stimulant use disorder with cocaine include:  Use of cocaine in larger amounts or over a longer period of time than intended.  Unsuccessful attempts to cut down or control cocaine use.  A lot of time spent obtaining, using, or recovering from the effects of cocaine.  A strong desire or urge to use cocaine (craving).  Continued use of cocaine in spite of major problems at work, school, or home because of use.  Continued use of cocaine in spite of relationship problems because of use.  Giving up or cutting down on important life activities because of cocaine use.  Use of cocaine over and over in situations when it is physically hazardous, such as driving a car.  Continued use of cocaine in spite of a physical problem that is  likely related to use. Physical problems can include:  Malnutrition.  Nosebleeds.  Chest pain.  High blood pressure.  A hole that develops between the part of your nose that separates your nostrils (perforated nasal septum).  Lung and kidney damage.  Continued use of cocaine in spite of a mental problem that is likely related to use. Mental problems can include:  Schizophrenia-like symptoms.  Depression.  Bipolar mood swings.  Anxiety.  Sleep problems.  Need to use more and more cocaine to get the same effect, or lessened effect over time with use of the same amount of cocaine (tolerance).  Having withdrawal symptoms when cocaine use is stopped, or using cocaine to reduce or avoid withdrawal symptoms. Withdrawal symptoms include:  Depressed or irritable mood.  Low energy or restlessness.  Bad dreams.  Poor or excessive sleep.  Increased appetite. DIAGNOSIS Stimulant use disorder is diagnosed by your health care provider. You may be asked questions about your cocaine use and how it affects your life. A physical exam may be done. A drug screen may be ordered. You may be referred to a mental health professional. The diagnosis of stimulant use disorder requires at least two symptoms within 12 months. The type of stimulant use disorder depends on the number of signs and symptoms you have. The type may be:  Mild. Two or three signs and symptoms.  Moderate. Four or five signs and symptoms.  Severe. Six or more signs and symptoms. TREATMENT Treatment for stimulant use disorder is usually provided by mental health professionals with training in substance use disorders. The following options are  available:  Counseling or talk therapy. Talk therapy addresses the reasons you use cocaine and ways to keep you from using again. Goals of talk therapy include:  Identifying and avoiding triggers for use.  Handling cravings.  Replacing use with healthy activities.  Support  groups. Support groups provide emotional support, advice, and guidance.  Medicine. Certain medicines may decrease cocaine cravings or withdrawal symptoms. HOME CARE INSTRUCTIONS  Take medicines only as directed by your health care provider.  Identify the people and activities that trigger your cocaine use and avoid them.  Keep all follow-up visits as directed by your health care provider. SEEK MEDICAL CARE IF:  Your symptoms get worse or you relapse.  You are not able to take medicines as directed. SEEK IMMEDIATE MEDICAL CARE IF:  You have serious thoughts about hurting yourself or others.  You have a seizure, chest pain, sudden weakness, or loss of speech or vision. [place] on Drug Abuse: motorcyclefax.com  Substance Abuse and Mental Health Services Administration: ktimeonline.com Document Released: 11/05/2000 Document Revised: 03/25/2014 Document Reviewed: 11/21/2013 [facility] Patient Information 2015 [place], [place]. This information is not intended to replace advice given to you by your health care provider. Make sure you discuss any questions you have with your health care provider. Emergency Department Resource Guide 1) Find a Doctor and Pay Out of Pocket Although you won't have to find out who is covered by your insurance plan, it is a good idea to ask around and get recommendations. You will then need to call the office and see if the doctor you have chosen will accept you as a new patient and what types of options they offer for patients who are self-pay. Some doctors offer discounts or will set up payment plans for their patients who do not have insurance, but you will need to ask so you aren't surprised when you get to your appointment.  2) Contact Your Local Health Department Not all health departments have doctors that can see patients for sick visits, but many do, so it is worth a call to see if yours does. If you don't know where your  local health department is, you can check in your phone book. The CDC also has a tool to help you locate your state's health department, and many state websites also have listings of all of their local health departments.  3) Find a [facility] If your illness is not likely to be very severe or complicated, you may want to try a walk in clinic. These are popping up all over the country in pharmacies, drugstores, and shopping centers. They're usually staffed by nurse practitioners or physician assistants that have been trained to treat common illnesses and complaints. They're usually fairly quick and inexpensive. However, if you have serious medical issues or chronic medical problems, these are probably not your best option.   Chronic Pain Problems: Organization         Address     Phone             Notes  [facility]  [phone] Patients need to be referred by their primary care doctor.   Medication Assistance: Organization         Address     Phone             Notes  [facility] Medication [address] [place], [place] [address] [phone] --Must be a resident of [facility] --  Must have NO insurance coverage whatsoever (no Medicaid/ Medicare, etc.) -- The pt. MUST have a primary care doctor that directs their care regularly and follows them in the community   MedAssist  [phone]   [employer]  [phone]    Agencies that provide inexpensive medical care: Organization         Address     Phone             Notes  [place]  [phone]   [facility]    [phone]   [facility] [place], [place] [address] [phone]   [place] [address] [place] [phone]   Planned Parenthood    [phone]   [facility]    [phone]   [place] and [address], [place] Phone:  [phone], Fax:  [phone] Hours of Operation:  9 am - 6 pm, M-F.  Also accepts Medicaid/Medicare and self-pay.  [facility] for [address], [address], [place] Phone: [phone], Fax: [phone]. Hours of Operation:  8:30 am - 5:30 pm, M-F.  Also accepts Medicaid and self-pay.  [facility] [address] [place] Phone: [phone]   [place], [place], [place] [phone], Ext. [phone] Mondays & Thursdays: 7-9 AM.  First 15 patients are seen on a first come, first serve basis.   [facility] [address]  [address] [phone] Accepts Medicaid   [place] Providers:  Organization         Address     Phone             Notes  [facility] [address], [place] [phone] Also accepts self-pay patients.  [facility] [address], [place]  [phone]   [place], [address], [place] [phone]   [facility] [address], [place] [phone]   Lucianne Lei [address], [place]   [phone] Only accepts [place] Access [place] patients after they have their name applied to their card.   Self-Pay (no insurance) in [facility] [place] [facility]:  Organization         Address     Phone             Notes  Sickle Cell Patients, [facility] [place] [phone]   [facility] [place] [phone]   [facility] [place]  [place], [place], [place] [phone]   [facility]/Dr. Osei-Bonsu  [address] [place] or [address], [place] [phone] Phone number for both [place] and [place] locations is the  same.  Urgent Medical and [facility] [address] [place] [phone]   [facility] [address], [place] or [address] [phone]   [facility] [address] [place] [phone], phone; [phone], fax Sees patients 1st and 3rd Saturday of every month.  Must not qualify for public or private insurance (i.e. Medicaid, Medicare,   Health Choice, Veterans' Benefits)  Household income should be no more than 200% of the poverty level The clinic cannot treat you if you are pregnant or think you are pregnant  Sexually transmitted diseases are not treated at the clinic.    Dental Care:  Organization         Address     Phone             Notes  [facility] Department of [facility] [place] [phone] Accepts children up to age 43 who are enrolled in [place] or [place]; pregnant women with a Medicaid card; and children who have applied for Medicaid or [place] Health Choice, but were declined, whose parents can pay a reduced fee at time of service.  [facility] Department of [facility]  [address], [place] [phone] Accepts children up to age 39 who are enrolled in [place] or [place]; pregnant women with a Medicaid card; and children who have applied for Medicaid or [place] Health Choice, but were declined, whose parents can pay a reduced fee at time of service.  [place] Adult Dental Access PROGRAM  [place] [phone] Patients are seen by appointment only. Walk-ins are not accepted. [place] will see patients 52 years of age and older. Monday - Tuesday (8am-5pm) Most Wednesdays (8:30-5pm) $30 per visit, cash only  [facility] Adult Dental Access PROGRAM  [address], [facility] [phone] Patients are seen by appointment only. Walk-ins are not accepted. [place]  will see patients 68 years of age and older. One Wednesday Evening (Monthly: Volunteer Based).  $30 per visit, cash only  [place]  [phone] for adults; Children under age 51, call Graduate Pediatric Dentistry at [phone]. Children aged 45-14, please call [phone] to request a pediatric application.  Dental services are provided in all areas of dental care including fillings, crowns and bridges, complete and partial dentures, implants, gum treatment, root canals, and extractions. Preventive care is also provided. Treatment is provided to both adults and children. Patients are selected via a lottery and there is often a waiting list.   [facility] [address] [place]  [phone] [email or web address]   [facility] [address] [place], [place] [phone], Ext. [phone] Second and Fourth Thursday of each month, opens at 6:30 AM; Clinic ends at 9 AM.  Patients are seen on a first-come first-served basis, and a limited number are seen during each clinic.   [facility]  [address] Hillard Danker [place], [place] [phone]   Eligibility Requirements You must have lived in [place], [place], or [place] counties for at least the last three months.   You cannot be eligible for state or federal sponsored [employer], including [employer], [place], or [employer].   You generally cannot be eligible for healthcare insurance through your employer.    How to apply: Eligibility screenings are held every Tuesday and Wednesday afternoon from 1:00 pm until 4:00 pm. You do not need an appointment for the interview!  [facility] [address] [place], [place]   [place]  [place] Department  [place]  [phone]    Behavioral Health Resources in the Community: Intensive Outpatient  Programs Organization  Address     Phone             Notes  [place] [address] [place], [place] [phone]   [facility] Outpatient [address] [place], [place]   ADS: Alcohol & Drug Svcs [address], [place], [place]   [place] [address]  [place], [place] or [phone]     Substance Abuse Resources Organization         Address     Phone             Notes  Alcohol and Drug Services  [phone]   [place]  [phone]   [facility] [place]   Chinita Pester  [phone]   Residential & Outpatient Substance Abuse Program  [phone]   Psychological Services Organization         Address     Phone             Notes  [facility] [place]  [place]  [phone]   [place] [address] [place] or [phone]    Mobile Crisis Teams Organization         Address     Phone             Notes  [facility]rapeutic [facility]atives, Mobile Crisis Care Unit  [phone]   Assertive Psychotherapeutic Services  [address] [place], [place]   [place] Levels [address] [place] [phone]    Self-Help/Support Groups Organization         Address     Phone             Notes  [place]. of [place] - variety of support groups  [place] Call for more information  Narcotics Anonymous (NA), [facility] [address], [employer] [place]  2 meetings at this location   Special educational needs teacher         Address     Phone             Notes  [facility]AP Residential Treatment [place],    [place]  [phone]   [facility]  [address], [place] [address], [place], [place]   Aiken Orick, [place] [phone] Admissions: 8am-3pm M-F    Incentives Substance [place] [address]    [place], [place] [phone]   [facility] [address] [place], [place], [place]   [facility] [address]  [place], [place]   [facility] Programs - Intensive Outpatient [address] Kristeen [address], [place], [place]   [facility] ([place].,  [place], [place] [phone] or [phone]   [facility]idential Treatment Services (RTS) [address] [place], [place] Accepts Medicaid  [facility] [place] [address],  [place] [phone] Substance Abuse/Addiction Treatment   [facility] Organization         Address     Phone             Notes  [facility]  [phone]   Domenic Schwab, PhD [address] [place], [place]   [phone] or [phone]   [place], [place] [phone]  [facility] [address], [place], [place] [phone] Insurance/Medicaid/sponsorship through [employer] and Families [address] [place], [place] [phone] [place], [place] [phone]    Dr. Adele Schilder  [phone]   [facility] Dept. 1) [address], [place] 2) [address], [place] 3)  [place] [address], [place] [phone]  [phone]   [place] [phone] or [phone] (After Hours)     [place]: Abuse and Market researcher         Address     Phone             Notes  Child/Elder Abuse Hotline  403-812-6603   Family Abuse Services  9516598245 24 hour crisis line  [place] Sexual [place]  [place] Substance Use Organization         Address     Phone             Notes  [employer]   ([phone] 24 hour crisis line  Advance Access  [address] [place] [phone] Monday- Friday, walk-in,  8am-8pm  RTSA Detoxification & Crisis Stabilization  [phone]   Alcoholics Anonymous (  [phone] [employer]  Narcotics Anonymous  [facility] [place] [facility]gent Care Centers Organization         Address     Phone             Notes  [place] Department  [facility]eteran at [place]   [place]   [facility]  [phone] Uninsured patients meeting eligibility requirement  [place]  [phone]      [place]  [place]  [facility]   [place]  [phone]     Additional [facility] Resources Organization         Address     Phone             Notes  [place] and [place]  [place]  [phone] Medicaid, Nutrition, Medicine Assistance, Utility Assistance  [place] 7825811712   [place] [facility]  8657659344   [place]  [place] of [place]  [phone] Adult & family shelter, food, utility & rent assistance  24 Hour crisis line for those facing homelessness  [phone]   [facility] Transit  [phone] Roxy Manns, [facility] public transportation system  Homecare Providers  [phone] HIV/AIDS Case Management, FREE HIV SCREEN  Medication Management  [phone] Ongoing medication assistance for patients meeting  eligibility requirements  Medication Drop Box Locations: [place] Dept., [facility] Police Dept.,  [place] Dept., [facility] office  Safely rid of unused medications  [employer]  8634284129 Crisis assistance, medication, housing, food, utility assistance  [place].  [facility])  [phone]

## 2015-04-21 NOTE — ED Notes (Signed)
Pt escorted to discharge window. Verbalized understanding discharge instructions. In no acute distress.  This RN highlighted inpatient treatment facilities in the area.  Pt verbalized understanding.

## 2015-05-12 ENCOUNTER — Emergency Department (HOSPITAL_COMMUNITY): Payer: Medicaid Other

## 2015-05-12 ENCOUNTER — Emergency Department (HOSPITAL_COMMUNITY)
Admission: EM | Admit: 2015-05-12 | Discharge: 2015-05-12 | Disposition: A | Discharge status: Home / Self Care | Payer: Medicaid Other | Attending: Emergency Medicine | Admitting: Emergency Medicine

## 2015-05-12 ENCOUNTER — Encounter (HOSPITAL_COMMUNITY): Payer: Self-pay | Admitting: Emergency Medicine

## 2015-05-12 DIAGNOSIS — S93402A Sprain of unspecified ligament of left ankle, initial encounter: Secondary | ICD-10-CM

## 2015-05-12 DIAGNOSIS — Y939 Activity, unspecified: Secondary | ICD-10-CM | POA: Insufficient documentation

## 2015-05-12 DIAGNOSIS — Z8751 Personal history of pre-term labor: Secondary | ICD-10-CM | POA: Insufficient documentation

## 2015-05-12 DIAGNOSIS — Y999 Unspecified external cause status: Secondary | ICD-10-CM | POA: Insufficient documentation

## 2015-05-12 DIAGNOSIS — W010XXA Fall on same level from slipping, tripping and stumbling without subsequent striking against object, initial encounter: Secondary | ICD-10-CM | POA: Insufficient documentation

## 2015-05-12 DIAGNOSIS — Z91040 Latex allergy status: Secondary | ICD-10-CM | POA: Insufficient documentation

## 2015-05-12 DIAGNOSIS — Y929 Unspecified place or not applicable: Secondary | ICD-10-CM | POA: Insufficient documentation

## 2015-05-12 MED ORDER — OXYCODONE-ACETAMINOPHEN 5-325 MG PO TABS
1.0000 | ORAL_TABLET | Freq: Once | ORAL | Status: AC
  Administered 2015-05-12: 1 via ORAL
  Filled 2015-05-12: qty 1

## 2015-05-12 MED ORDER — DICLOFENAC SODIUM 50 MG PO TBEC: 50.0000 mg | DELAYED_RELEASE_TABLET | Freq: Two times a day (BID) | ORAL | Status: DC

## 2015-05-12 NOTE — ED Provider Notes (Signed)
CSN: [id number]     Arrival date & time 05/12/15  1912 History   This chart was scribed for non-physician practitioner, Beth Jacobs. Janit Bern, NP working with Lacretia Leigh, MD by Hansel Feinstein, ED scribe. This patient was seen in room TR05C/TR05C and the patient's care was started at 8:23 PM    Chief Complaint  Patient presents with  . Ankle Injury    The patient said her flip flop folded and she twisted her left ankle.  She rates her pain 10/10.   Patient is a 44 y.o. female presenting with lower extremity injury. The history is provided by the patient. No language interpreter was used.  Ankle Injury This is a new problem. The current episode started 6 to 12 hours ago. The problem occurs constantly. The problem has not changed since onset.The symptoms are aggravated by walking. Nothing relieves the symptoms. She has tried nothing for the symptoms. The treatment provided no relief.    HPI Comments: Beth Jacobs is a 44 y.o. female who presents to the Emergency Department complaining of a left  ankle injury after tripping on her flip flop onset at 1200. She states that she tripped and fell forward onto her knees. She states associated moderate, sudden onset left ankle pain. No OTC pain medications tried PTA. Pt states that she vomits when she takes Hydrocodone. She denies numbness. Last visit her 5/30 for cocaine abuse.   Past Medical History  Diagnosis Date  . Preterm labor   . History of allergic reaction 02/26/2013  . Torn Achilles tendon    Past Surgical History  Procedure Laterality Date  . No past surgeries    . Dilation and evacuation    . Wisdom tooth extraction     Family History  Problem Relation Age of Onset  . Other Neg Hx   . Cerebral palsy Son     born at [redacted] weeks gestation   History  Substance Use Topics  . Smoking status: Never Smoker   . Smokeless tobacco: Not on file  . Alcohol Use: No   OB History    Gravida Para Term Preterm AB TAB SAB Ectopic Multiple Living   6 4 3  1 2 1 1   4      Review of Systems  Musculoskeletal: Positive for myalgias and arthralgias.       Left ankle pain  Neurological: Negative for numbness.  All other systems reviewed and are negative.  Allergies  Bee venom; Hydrocodone-acetaminophen; Latex; and Tomato  Home Medications   Prior to Admission medications   Medication Sig Start Date End Date Taking? Authorizing Provider  acetaminophen (TYLENOL) 500 MG tablet Take 1,000 mg by mouth every 6 (six) hours as needed for moderate pain (pain).    Historical Provider, MD  diclofenac (VOLTAREN) 50 MG EC tablet Take 1 tablet (50 mg total) by mouth 2 (two) times daily. 05/12/15   Genita Nilsson Bunnie Pion, NP   BP 137/80 mmHg  Pulse 100  Temp(Src) 98.4 F (36.9 C) (Oral)  Resp 16  Ht 5\' 2"  (1.575 m)  Wt 170 lb (77.111 kg)  BMI 31.09 kg/m2  SpO2 100%  LMP 05/11/2015 Physical Exam  Constitutional: She is oriented to person, place, and time. She appears well-developed and well-nourished.  HENT:  Head: Normocephalic and atraumatic.  Eyes: EOM are normal.  Neck: Normal range of motion. Neck supple.  Cardiovascular: Normal rate.   Pulses:      Dorsalis pedis pulses are 2+ on the right side,  and 2+ on the left side.  Pulmonary/Chest: Effort normal. No respiratory distress.  Musculoskeletal: Normal range of motion. She exhibits tenderness.       Left ankle: She exhibits swelling. She exhibits no laceration and normal pulse. Decreased range of motion: due to pain. Tenderness. Medial malleolus tenderness found. Achilles tendon normal.  Achilles tenderness nl. Tenderness and swelling to the medial aspect of the left ankle. She has pain with ROM and palpation.   Neurological: She is alert and oriented to person, place, and time.  Skin: Skin is warm and dry.  Psychiatric: She has a normal mood and affect. Her behavior is normal.  Nursing note and vitals reviewed.   ED Course  Procedures (including critical care time) DIAGNOSTIC STUDIES: Oxygen  Saturation is 100% on RA, normal by my interpretation.    COORDINATION OF CARE: 8:26 PM Discussed treatment plan with pt at bedside and pt agreed to plan.   Labs Review Labs Reviewed - No data to display  Imaging Review Dg Ankle Complete Left  05/12/2015   CLINICAL DATA:  Patient status post fall.  EXAM: LEFT ANKLE COMPLETE - 3+ VIEW  COMPARISON:  None.  FINDINGS: There is no evidence of fracture, dislocation, or joint effusion. There is no evidence of arthropathy or other focal bone abnormality. Soft tissues are unremarkable.  IMPRESSION: Negative.   Electronically Signed   By: Lovey Newcomer M.D.   On: 05/12/2015 19:58     MDM  44 y.o. female with left ankle pain and swelling s/p injury earlier today. Stable for d/c without neurovascular compromise. ASO, ice, elevation, crutches and follow up with ortho if symptoms persist. Will treat with NSAIDS.   Final diagnoses:  Ankle sprain, left, initial encounter   I personally performed the services described in this documentation, which was scribed in my presence. The recorded information has been reviewed and is accurate.    [place], [place] 05/12/15 2037  Lacretia Leigh, MD 05/12/15 2039

## 2015-05-12 NOTE — ED Notes (Signed)
The patient said her flip flop folded and she twisted her left ankle.  She rates her pain 10/10.  She has not taken anything for the pain.

## 2015-05-28 ENCOUNTER — Emergency Department (HOSPITAL_COMMUNITY): Payer: Medicaid Other

## 2015-05-28 ENCOUNTER — Encounter (HOSPITAL_COMMUNITY): Payer: Self-pay | Admitting: *Deleted

## 2015-05-28 ENCOUNTER — Emergency Department (HOSPITAL_COMMUNITY)
Admission: EM | Admit: 2015-05-28 | Discharge: 2015-05-28 | Disposition: A | Discharge status: Home / Self Care | Payer: Medicaid Other | Attending: Emergency Medicine | Admitting: Emergency Medicine

## 2015-05-28 DIAGNOSIS — Z91040 Latex allergy status: Secondary | ICD-10-CM | POA: Insufficient documentation

## 2015-05-28 DIAGNOSIS — S0083XA Contusion of other part of head, initial encounter: Secondary | ICD-10-CM | POA: Insufficient documentation

## 2015-05-28 DIAGNOSIS — S0012XA Contusion of left eyelid and periocular area, initial encounter: Secondary | ICD-10-CM | POA: Insufficient documentation

## 2015-05-28 DIAGNOSIS — Z791 Long term (current) use of non-steroidal anti-inflammatories (NSAID): Secondary | ICD-10-CM | POA: Insufficient documentation

## 2015-05-28 DIAGNOSIS — Z8751 Personal history of pre-term labor: Secondary | ICD-10-CM | POA: Insufficient documentation

## 2015-05-28 DIAGNOSIS — S0990XA Unspecified injury of head, initial encounter: Secondary | ICD-10-CM | POA: Insufficient documentation

## 2015-05-28 DIAGNOSIS — R51 Headache: Secondary | ICD-10-CM

## 2015-05-28 DIAGNOSIS — R519 Headache, unspecified: Secondary | ICD-10-CM

## 2015-05-28 DIAGNOSIS — Y9289 Other specified places as the place of occurrence of the external cause: Secondary | ICD-10-CM | POA: Insufficient documentation

## 2015-05-28 DIAGNOSIS — Y998 Other external cause status: Secondary | ICD-10-CM | POA: Insufficient documentation

## 2015-05-28 DIAGNOSIS — Y9389 Activity, other specified: Secondary | ICD-10-CM | POA: Insufficient documentation

## 2015-05-28 MED ORDER — TRAMADOL HCL 50 MG PO TABS: 50.0000 mg | ORAL_TABLET | Freq: Four times a day (QID) | ORAL | Status: DC | PRN

## 2015-05-28 MED ORDER — KETOROLAC TROMETHAMINE 60 MG/2ML IM SOLN
60.0000 mg | Freq: Once | INTRAMUSCULAR | Status: AC
  Administered 2015-05-28: 60 mg via INTRAMUSCULAR
  Filled 2015-05-28: qty 2
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
## 2015-05-28 NOTE — ED Notes (Signed)
Pt reported she has no where to go and would like to stay in lobby until family member is able to meet her at family members home. [place], CN made aware and stated to place pt in lobby and call social worker for f/u.  Pt placed in lobby,  Phone call complete to Art, NS to make social worker aware that pt needs to assistance.
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
## 2015-05-28 NOTE — ED Notes (Signed)
Patient transported to CT 

## 2015-06-16 ENCOUNTER — Encounter (HOSPITAL_COMMUNITY): Payer: Self-pay | Admitting: *Deleted

## 2015-06-16 ENCOUNTER — Emergency Department (HOSPITAL_COMMUNITY)
Admission: EM | Admit: 2015-06-16 | Discharge: 2015-06-16 | Disposition: A | Discharge status: Home / Self Care | Payer: Medicaid Other | Attending: Emergency Medicine | Admitting: Emergency Medicine

## 2015-06-16 DIAGNOSIS — Z87828 Personal history of other (healed) physical injury and trauma: Secondary | ICD-10-CM | POA: Insufficient documentation

## 2015-06-16 DIAGNOSIS — R21 Rash and other nonspecific skin eruption: Secondary | ICD-10-CM

## 2015-06-16 DIAGNOSIS — Z8751 Personal history of pre-term labor: Secondary | ICD-10-CM | POA: Insufficient documentation

## 2015-06-16 DIAGNOSIS — Z791 Long term (current) use of non-steroidal anti-inflammatories (NSAID): Secondary | ICD-10-CM | POA: Insufficient documentation

## 2015-06-16 DIAGNOSIS — Z91040 Latex allergy status: Secondary | ICD-10-CM | POA: Insufficient documentation

## 2015-06-16 MED ORDER — HYDROXYZINE HCL 25 MG PO TABS: 25.0000 mg | ORAL_TABLET | Freq: Four times a day (QID) | ORAL | Status: DC | PRN

## 2015-06-16 NOTE — ED Notes (Signed)
Stayed in hotel this weekend. No new soaps, lotions, perfumes or detergents. Itchy papular rash started on chest/neck has spread to arms and right hip. Took 25 mg Benadryl last night - no relief.

## 2015-06-16 NOTE — ED Provider Notes (Signed)
CSN: [id number]     Arrival date & time 06/16/15  7341 History   First MD Initiated Contact with Patient 06/16/15 [phone]     Chief Complaint  Patient presents with  . Rash    HPI Patient presents to the emergency room for evaluation of a rash. Patient stated a hotel this weekend. Yesterday she noticed a rash that developed on her chest and neck and now is involving her arms. The rash is consistent of multiple small raised bumps. They are very itchy. She has not had any trouble with her breathing.  She is not sure what triggered the rash. She has not used any new detergents or soaps. She does not recall being exposed to anything in particular. She had not noticed any bugs. Past Medical History  Diagnosis Date  . Preterm labor   . History of allergic reaction 02/26/2013  . Torn Achilles tendon    Past Surgical History  Procedure Laterality Date  . No past surgeries    . Dilation and evacuation    . Wisdom tooth extraction     Family History  Problem Relation Age of Onset  . Other Neg Hx   . Cerebral palsy Son     born at [redacted] weeks gestation   History  Substance Use Topics  . Smoking status: Never Smoker   . Smokeless tobacco: Not on file  . Alcohol Use: No   OB History    Gravida Para Term Preterm AB TAB SAB Ectopic Multiple Living   6 4 3 1 2 1 1   4      Review of Systems  All other systems reviewed and are negative.     Allergies  Bee venom; Hydrocodone-acetaminophen; Latex; and Tomato  Home Medications   Prior to Admission medications   Medication Sig Start Date End Date Taking? Authorizing Provider  acetaminophen (TYLENOL) 500 MG tablet Take 1,000 mg by mouth every 6 (six) hours as needed for moderate pain (pain).    Historical Provider, MD  diclofenac (VOLTAREN) 50 MG EC tablet Take 1 tablet (50 mg total) by mouth 2 (two) times daily. 05/12/15   Hope Bunnie Pion, NP  hydrOXYzine (ATARAX/VISTARIL) 25 MG tablet Take 1 tablet (25 mg total) by mouth every 6 (six) hours as  needed for itching. 06/16/15   Dorie Rank, MD  traMADol (ULTRAM) 50 MG tablet Take 1 tablet (50 mg total) by mouth every 6 (six) hours as needed. 05/28/15   Glendell Docker, NP   Pulse 78  Temp(Src) 98.2 F (36.8 C) (Oral)  Resp 16  SpO2 99%  LMP 05/20/2015 Physical Exam  Constitutional: She appears well-developed and well-nourished. No distress.  HENT:  Head: Normocephalic and atraumatic.  Right Ear: External ear normal.  Left Ear: External ear normal.  Eyes: Conjunctivae are normal. Right eye exhibits no discharge. Left eye exhibits no discharge. No scleral icterus.  Neck: Neck supple. No tracheal deviation present.  Cardiovascular: Normal rate.   Pulmonary/Chest: Effort normal. No stridor. No respiratory distress.  Musculoskeletal: She exhibits no edema.  Neurological: She is alert. Cranial nerve deficit: no gross deficits.  Skin: Skin is warm and dry. Rash noted.  Small approximate 1-2 mm sized raised papules, no drainage, no surrounding erythema, no pustules, no urticaria, no petechiae or purpura  Psychiatric: She has a normal mood and affect.  Nursing note and vitals reviewed.   ED Course  Procedures (including critical care time) Labs Review Labs Reviewed - No data to display  Imaging Review  No results found.   EKG Interpretation None      MDM   Final diagnoses:  Rash    ? Rash associated with bed bugs. Will rx atarax.  Recc treating clothing etc.    Dorie Rank, MD 06/16/15 [phone]

## 2015-06-16 NOTE — Discharge Instructions (Signed)
Rash A rash is a change in the color or texture of your skin. There are many different types of rashes. You may have other problems that accompany your rash. CAUSES   Infections.  Allergic reactions. This can include allergies to pets or foods.  Certain medicines.  Exposure to certain chemicals, soaps, or cosmetics.  Heat.  Exposure to poisonous plants.  Tumors, both cancerous and noncancerous. SYMPTOMS   Redness.  Scaly skin.  Itchy skin.  Dry or cracked skin.  Bumps.  Blisters.  Pain. DIAGNOSIS  Your caregiver may do a physical exam to determine what type of rash you have. A skin sample (biopsy) may be taken and examined under a microscope. TREATMENT  Treatment depends on the type of rash you have. Your caregiver may prescribe certain medicines. For serious conditions, you may need to see a skin doctor (dermatologist). HOME CARE INSTRUCTIONS   Avoid the substance that caused your rash.  Do not scratch your rash. This can cause infection.  You may take cool baths to help stop itching.  Only take over-the-counter or prescription medicines as directed by your caregiver.  Keep all follow-up appointments as directed by your caregiver. SEEK IMMEDIATE MEDICAL CARE IF:  You have increasing pain, swelling, or redness.  You have a fever.  You have new or severe symptoms.  You have body aches, diarrhea, or vomiting.  Your rash is not better after 3 days. MAKE SURE YOU:  Understand these instructions.  Will watch your condition.  Will get help right away if you are not doing well or get worse. Document Released: 10/29/2002 Document Revised: 01/31/2012 Document Reviewed: 08/23/2011 [facility] Patient Information 2015 [place], [place]. This information is not intended to replace advice given to you by your health care provider. Make sure you discuss any questions you have with your health care provider. Bedbugs Bedbugs are tiny bugs that live in and around beds.  During the day, they hide in mattresses and other places near beds. They come out at night and bite people lying in bed. They need blood to live and grow. Bedbugs can be found in beds anywhere. Usually, they are found in places where many people come and go (hotels, shelters, hospitals). It does not matter whether the place is dirty or clean. Getting bitten by bedbugs rarely causes a medical problem. The biggest problem can be getting rid of them. This often takes the work of a Financial risk analyst. CAUSES  Less use of pesticides. Bedbugs were common before the 1950s. Then, strong pesticides such as DDT nearly wiped them out. Today, these pesticides are not used because they harm the environment and can cause health problems.  More travel. Besides mattresses, bedbugs can also live in clothing and luggage. They can come along as people travel from place to place. Bedbugs are more common in certain parts of the world. When people travel to those areas, the bugs can come home with them.  Presence of birds and bats. Bedbugs often infest birds and bats. If you have these animals in or near your home, bedbugs may infest your house, too. SYMPTOMS It does not hurt to be bitten by a bedbug. You will probably not wake up when you are bitten. Bedbugs usually bite areas of the skin that are not covered. Symptoms may show when you wake up, or they may take a day or more to show up. Symptoms may include:  Small red bumps on the skin. These might be lined up in a row or  clustered in a group.  A darker red dot in the middle of red bumps.  Blisters on the skin. There may be swelling and very bad itching. These may be signs of an allergic reaction. This does not happen often. DIAGNOSIS Bedbug bites might look and feel like other types of insect bites. The bugs do not stay on the body like ticks or lice. They bite, drop off, and crawl away to hide. Your caregiver will probably:  Ask about your symptoms.  Ask  about your recent activities and travel.  Check your skin for bedbug bites.  Ask you to check at home for signs of bedbugs. You should look for:  Spots or stains on the bed or nearby. This could be from bedbugs that were crushed or from their eggs or waste.  Bedbugs themselves. They are reddish-brown, oval, and flat. They do not fly. They are about the size of an apple seed.  Places to look for bedbugs include:  Beds. Check mattresses, headboards, box springs, and bed frames.  On drapes and curtains near the bed.  Under carpeting in the bedroom.  Behind electrical outlets.  Behind any wallpaper that is peeling.  Inside luggage. TREATMENT Most bedbug bites do not need treatment. They usually go away on their own in a few days. The bites are not dangerous. However, treatment may be needed if you have scratched so much that your skin has become infected. You may also need treatment if you are allergic to bedbug bites. Treatment options include:  A drug that stops swelling and itching (corticosteroid). Usually, a cream is rubbed on the skin. If you have a bad rash, you may be given a corticosteroid pill.  Oral antihistamines. These are pills to help control itching.  Antibiotic medicines. An antibiotic may be prescribed for infected skin. HOME CARE INSTRUCTIONS   Take any medicine prescribed by your caregiver for your bites. Follow the directions carefully.  Consider wearing pajamas with long sleeves and pant legs.  Your bedroom may need to be treated. A pest control expert should make sure the bedbugs are gone. You may need to throw away mattresses or luggage. Ask the pest control expert what you can do to keep the bedbugs from coming back. Common suggestions include:  Putting a plastic cover over your mattress.  Washing and drying your clothes and bedding in hot water and a hot dryer. The temperature should be hotter than 120 F (48.9 C). Bedbugs are killed by high  temperatures.  Vacuuming carefully all around your bed. Vacuum in all cracks and crevices where the bugs might hide. Do this often.  Carefully checking all used furniture, bedding, or clothes that you bring into your house.  Eliminating bird nests and bat roosts.  If you get bedbug bites when traveling, check all your possessions carefully before bringing them into your house. If you find any bugs on clothes or in your luggage, consider throwing those items away. SEEK MEDICAL CARE IF:  You have red bug bites that keep coming back.  You have red bug bites that itch badly.  You have bug bites that cause a skin rash.  You have scratch marks that are red and sore. SEEK IMMEDIATE MEDICAL CARE IF: You have a fever. Document Released: 12/11/2010 Document Revised: 01/31/2012 Document Reviewed: 12/11/2010 [facility] Patient Information 2015 [place], [place]. This information is not intended to replace advice given to you by your health care provider. Make sure you discuss any questions you have with your health  care provider.  

## 2015-08-30 ENCOUNTER — Encounter (HOSPITAL_COMMUNITY): Payer: Self-pay | Admitting: Emergency Medicine

## 2015-08-30 ENCOUNTER — Emergency Department (HOSPITAL_COMMUNITY)
Admission: EM | Admit: 2015-08-30 | Discharge: 2015-08-31 | Disposition: A | Discharge status: Home / Self Care | Payer: Medicaid Other | Attending: Emergency Medicine | Admitting: Emergency Medicine

## 2015-08-30 DIAGNOSIS — Z791 Long term (current) use of non-steroidal anti-inflammatories (NSAID): Secondary | ICD-10-CM | POA: Insufficient documentation

## 2015-08-30 DIAGNOSIS — F1410 Cocaine abuse, uncomplicated: Secondary | ICD-10-CM | POA: Insufficient documentation

## 2015-08-30 DIAGNOSIS — Z87828 Personal history of other (healed) physical injury and trauma: Secondary | ICD-10-CM | POA: Insufficient documentation

## 2015-08-30 DIAGNOSIS — M79672 Pain in left foot: Secondary | ICD-10-CM | POA: Insufficient documentation

## 2015-08-30 DIAGNOSIS — Z91040 Latex allergy status: Secondary | ICD-10-CM | POA: Insufficient documentation

## 2015-08-30 DIAGNOSIS — M79671 Pain in right foot: Secondary | ICD-10-CM | POA: Insufficient documentation

## 2015-08-30 HISTORY — DX: Cocaine abuse, uncomplicated: F14.10

## 2015-08-30 MED ORDER — IBUPROFEN 400 MG PO TABS
600.0000 mg | ORAL_TABLET | Freq: Once | ORAL | Status: AC
  Administered 2015-08-31: 600 mg via ORAL
  Filled 2015-08-30 (×2): qty 1

## 2015-08-30 NOTE — ED Notes (Signed)
Pt. requesting drug rehab for her Cocaine addiction , last  intake this evening , denies suicidal ideation / no hallucinations , pt. added bilateral feet pain denies injury/ambulatory .

## 2015-08-30 NOTE — ED Provider Notes (Signed)
CSN: [id number]     Arrival date & time 08/30/15  2224 History  By signing my name below, I, Beth Jacobs, attest that this documentation has been prepared under the direction and in the presence of Varney Biles, MD. Electronically Signed: Stephania Jacobs, ED Scribe. 08/30/2015. 11:59 PM.     Chief Complaint  Patient presents with  . Drug Problem    The history is provided by the patient. No language interpreter was used.    HPI Comments: Beth Jacobs is a 44 y.o. female who presents to the Emergency Department seeking drug rehab for her cocaine addiction. She states she has been on a 4-day binge of smoking crack, last used at 2 AM, about 22 hours ago. She states she and her fiance have been arguing recently and has been talking to her disrespectfully, which triggered her to use cocaine. She states this addiction has been ongoing for some time, although she states she will reduce her cocaine use for a couple months at a time and then relapse. She states she has never sought any treatment or rehab for this. Patient denies EtOH consumption, although she states her mother has a history of alcohol abuse. Patient denies IVDA. She denies any chest pain.   Patient also complains of constant, dull, occasionally shooting pain in the soles of her bilateral feet radiating to the top of her feet that began yesterday. She reports that before this, for the past year, she would occasionally have pain in the soles of her bilateral feet only when she would get up to walk in the middle of the night. Patient states she works 2 jobs and is constantly on her feet, so she thought this might have been the causes of her symptoms.  Patient denies a history of any chronic medical problems, including DM. She states she takes iron supplements daily.  She denies any pins-and-needles sensation in her feet.    Past Medical History  Diagnosis Date  . Preterm labor   . History of allergic reaction 02/26/2013  . Torn Achilles tendon    . Cocaine abuse    Past Surgical History  Procedure Laterality Date  . No past surgeries    . Dilation and evacuation    . Wisdom tooth extraction     Family History  Problem Relation Age of Onset  . Other Neg Hx   . Cerebral palsy Son     born at [redacted] weeks gestation   Social History  Substance Use Topics  . Smoking status: Never Smoker   . Smokeless tobacco: None  . Alcohol Use: No   OB History    Gravida Para Term Preterm AB TAB SAB Ectopic Multiple Living   6 4 3 1 2 1 1   4      Review of Systems  Musculoskeletal: Positive for myalgias (bilateral foot pain).  All other systems reviewed and are negative.  Allergies  Bee venom; Hydrocodone-acetaminophen; Latex; and Tomato  Home Medications   Prior to Admission medications   Medication Sig Start Date End Date Taking? Authorizing Provider  acetaminophen (TYLENOL) 500 MG tablet Take 1,000 mg by mouth every 6 (six) hours as needed for moderate pain (pain).    Historical Provider, MD  diclofenac (VOLTAREN) 50 MG EC tablet Take 1 tablet (50 mg total) by mouth 2 (two) times daily. 05/12/15   Hope Bunnie Pion, NP  hydrOXYzine (ATARAX/VISTARIL) 25 MG tablet Take 1 tablet (25 mg total) by mouth every 6 (six) hours as needed  for itching. 06/16/15   Dorie Rank, MD  ibuprofen (ADVIL,MOTRIN) 400 MG tablet Take 1 tablet (400 mg total) by mouth every 6 (six) hours as needed. 08/31/15   Varney Biles, MD  traMADol (ULTRAM) 50 MG tablet Take 1 tablet (50 mg total) by mouth every 6 (six) hours as needed. 05/28/15   Glendell Docker, NP   BP 125/72 mmHg  Pulse 93  Temp(Src) 98.3 F (36.8 C) (Oral)  Resp 18  Ht 5\' 2"  (1.575 m)  Wt 189 lb 12.8 oz (86.093 kg)  BMI 34.71 kg/m2  SpO2 100% Physical Exam  Constitutional: She is oriented to person, place, and time. She appears well-developed and well-nourished. No distress.  HENT:  Head: Normocephalic and atraumatic.  Eyes: Conjunctivae and EOM are normal.  Neck: Neck supple. No tracheal  deviation present.  Cardiovascular: Normal rate.   Pulmonary/Chest: Effort normal. No respiratory distress.  Musculoskeletal: Normal range of motion.  Bilateral lower extremity: 2+ DP pulses that are equal. Skin is warm to touch. Cap refill is <2 seconds. Gross sensory exam is normal and equal bilaterally.  ROM of bilateral ankles is normal. There is no effusion over the ankles.   2+ radial pulses, equal bilaterally.  Neurological: She is alert and oriented to person, place, and time.  Skin: Skin is warm and dry.  Psychiatric: She has a normal mood and affect. Her behavior is normal.  Nursing note and vitals reviewed.   ED Course  Procedures (including critical care time)  DIAGNOSTIC STUDIES: Oxygen Saturation is 100% on RA, normal by my interpretation.    COORDINATION OF CARE: 11:49 PM - Discussed treatment plan with pt at bedside which includes pain-relieving medications and ice for bilateral foot pain. Do not believe any imaging is warranted at this time. Will give resources for patient to undergo drug rehab. Encouraged patient to consider her loved ones/children when making a decision to commit to following through with drug rehabilitation. Pt verbalized understanding and agreed to plan.    MDM   Final diagnoses:  Cocaine abuse  Pain in both feet   I personally performed the services described in this documentation, which was scribed in my presence. The recorded information has been reviewed and is accurate.  Pt comes in with cc of pain in feet - plantar area.  She has cocaine use.  DDX: Vasculitis, Neuropathic pain. She has strong and equal DP bilaterally and her skin is warm there is no sloughing and there is not erythema or signs of infection. For cocaine - resources provided.     Varney Biles, MD 08/31/15 [id number]

## 2015-08-31 MED ORDER — IBUPROFEN 400 MG PO TABS: 400.0000 mg | ORAL_TABLET | Freq: Four times a day (QID) | ORAL | Status: DC | PRN

## 2015-08-31 NOTE — Discharge Instructions (Signed)
Please either call [facility] or utilize any of the other sources from below to get help with the feet pain and cocaine rehab.   Emergency Department Resource Guide 1) Find a Doctor and Pay Out of Pocket Although you won't have to find out who is covered by your insurance plan, it is a good idea to ask around and get recommendations. You will then need to call the office and see if the doctor you have chosen will accept you as a new patient and what types of options they offer for patients who are self-pay. Some doctors offer discounts or will set up payment plans for their patients who do not have insurance, but you will need to ask so you aren't surprised when you get to your appointment.  2) Contact Your Local Health Department Not all health departments have doctors that can see patients for sick visits, but many do, so it is worth a call to see if yours does. If you don't know where your local health department is, you can check in your phone book. The CDC also has a tool to help you locate your state's health department, and many state websites also have listings of all of their local health departments.  3) Find a [facility] If your illness is not likely to be very severe or complicated, you may want to try a walk in clinic. These are popping up all over the country in pharmacies, drugstores, and shopping centers. They're usually staffed by nurse practitioners or physician assistants that have been trained to treat common illnesses and complaints. They're usually fairly quick and inexpensive. However, if you have serious medical issues or chronic medical problems, these are probably not your best option.  No Primary Care Doctor: - Call Health Connect at  [phone] - they can help you locate a primary care doctor that  accepts your insurance, provides certain services, etc. - Physician Referral Service- [phone]  Chronic Pain Problems: Organization         Address  Phone   Notes  [facility]  [phone] Patients need to be referred by their primary care doctor.   Medication Assistance: Organization         Address  Phone   Notes  [facility] [place] [place], [place] [address] [phone] --Must be a resident of [facility] -- Must have NO insurance coverage whatsoever (no Medicaid/ Medicare, etc.) -- The pt. MUST have a primary care doctor that directs their care regularly and follows them in the community   [facility]  [phone]   [employer]  [phone]    Agencies that provide inexpensive medical care: Organization         Address  Phone   Notes  [place]  [phone]   [facility]    [phone]   [facility] [place], [place] [address] [phone]   [facility] [address] [place] [phone]   Planned Parenthood    [phone]   [facility]    [phone]   [place] [address] [place], [place] Phone:  [phone], Fax:  [phone] Hours of Operation:  9 am - 6 pm, M-F.  Also accepts Medicaid/Medicare and self-pay.  [facility] for [place] [address], [employer] Phone: [phone])  [phone], Fax: [phone]. Hours of Operation:  8:30 am - 5:30 pm, M-F.  Also accepts Medicaid and self-pay.  [facility] [place] [address] [place] Phone: [phone]   [place], [place], [place] [phone], Ext. [phone] Mondays & Thursdays: 7-9 AM.  First 15 patients are seen on a first come, first serve basis.    [place] Providers:  Organization         Address  Phone   Notes  [facility] [address], [place] [phone] Also accepts self-pay patients.  [facility] [address], [place]  [phone]   [place], [address], [place] [phone]   [facility] [address] [place] [phone]   Lucianne Lei [address], [place]   [phone] Only accepts [place] Access [place] patients after they have their name applied to their card.   Self-Pay (no insurance) in [facility]:  Organization         Address  Phone   Notes  Sickle Cell Patients, [facility] [place] [phone]   [facility] [place] [phone]   [facility] [place]  [place], [place], [place] [phone]   [facility]/Dr. Osei-Bonsu  [address] [place] or [address], [place] [phone] Phone number for both [place] and [place] locations is the same.  [facility]gent [facility] [address], [place] [phone]   [facility] [address], [place] or [address] [phone]   [facility] [address], [place] [phone], phone; [phone], fax Sees patients 1st and 3rd Saturday of every month.  Must not qualify for public or private insurance (i.e. Medicaid, Medicare, [place] Health Choice, Veterans' Benefits)  Household income should be no more than 200% of the poverty level The clinic cannot treat you if you are pregnant or think you are pregnant  Sexually transmitted diseases are not treated at the clinic.    Dental Care: Organization         Address  Phone  Notes  [facility] Department of [facility] [place] [phone] Accepts children up to age 46 who are enrolled in [place] or [place]; pregnant women with a Medicaid card; and children who have applied for  Medicaid or [place] Health Choice, but were declined, whose parents can pay a reduced fee at time of service.  [facility] Department of [facility]  [address], [place] [phone] Accepts children up to age 24 who are enrolled in [place] or [place]; pregnant women with a Medicaid card; and children who have applied for Medicaid or [place] Health Choice, but were declined, whose parents can pay a reduced fee at time of service.  [place] Adult [facility] Access PROGRAM  [place] [phone] Patients are seen by appointment only. Walk-ins are not accepted. [place] will see patients 25 years of age and older. Monday - Tuesday (8am-5pm) Most Wednesdays (8:30-5pm) $30 per visit, cash only  [place] Adult Dental Access PROGRAM  [address], [employer]  [place] [phone] Patients are seen by appointment only. Walk-ins are not accepted. [place] will see patients 54 years of age and older. One Wednesday Evening (Monthly: Volunteer Based).  $30 per visit, cash only  [place]  [phone] for adults; Children under age 11, call Graduate Pediatric Dentistry at [phone]. Children aged 69-14, please call [phone] to request a pediatric application.  Dental services are provided in all areas of dental care including fillings, crowns and bridges, complete and partial dentures, implants, gum treatment, root canals, and extractions. Preventive care is also provided. Treatment is provided to both adults and children. Patients are selected via a lottery and there is often a waiting list.   [facility] [address] [place]  [phone] [email or web address]   [facility] [address] [place], [place] [phone], Ext. [phone] Second and Fourth Thursday of each month, opens at 6:30 AM; Clinic ends at 9 AM.  Patients are seen on a first-come first-served basis, and a limited number  are seen during each clinic.   [facility]  [address] [place]er [place], [place] [phone]   Eligibility Requirements You must have lived in [place], [place], or [place] counties for at least the last three months.   You cannot be eligible for state or federal sponsored [employer], including [employer], [place], or [employer].   You generally cannot be eligible for healthcare insurance through your employer.    How to apply: Eligibility screenings are held every Tuesday and Wednesday afternoon from 1:00 pm until 4:00 pm. You do not need an appointment for the interview!  [facility] [address] [place], [place]   [place]  [place] Department  [place]  [phone]    Behavioral Health Resources in the Community: Intensive Outpatient Programs Organization         Address  Phone  Notes  [place]. [address], [place], [place] [phone]   [facility]patient [address] [place], [place]   ADS: [facility] [address], [place], [place]   [place] [address],  [place], [place] or [phone]   Substance Abuse Resources Organization         Address  Phone  Notes  Al[facility] and Drug Services  [phone]   [place]  [phone]   The [place]   Chinita Pester  [phone]   Residential & Outpatient Substance Abuse Program  [phone]   Psychological Services Organization         Address  Phone  Notes  [facility] [place]  [place]  [phone]   [place] [address], [place] or [phone]    Mobile Crisis Teams Organization         Address  Phone  Notes  Therapeutic  Alternatives, Mobile Crisis Care Unit  [phone]   [facility]  [address] [place], [place]   [place] [address], [place] [phone]    Self-Help/Support Groups Organization         Address  Phone             Notes  [place]. of [place] - variety of support groups  [phone]- H3156881 Call for more information  Narcotics Anonymous (NA), [facility] [address], [employer] [place]  2 meetings at this location   Residential Facilities manager         Address  Phone  Notes  [facility] [place],    [place]  [phone])[phone]   [facility]  [address], [place] [phone], [place], [place]   [place], [place] [phone] Admissions: 8am-3pm M-F  Incentives Substance [place] [address]    [place], [place] [phone]   [facility] [address] [place], [place], [place]   [facility] [address],  [place], [place]   [facility] - Intensive Outpatient [address], [place], [place]   [facility] [place].,  [place], [place] [phone] or [phone]   [facility] (RTS) [address] [place], [place] Accepts Medicaid  [facility] [place] [address],  [place] [phone] Substance Abuse/Addiction Treatment   [facility] Organization         Address  Phone  Notes  [facility]  [phone]   Domenic Schwab, PhD [address] [place]lis [place], [place]   [phone] or [phone]   [place], [place] [phone]   [facility] [address] [place], [place] [phone] Insurance/Medicaid/sponsorship through [facility]  [address] [place]                                    [place], [place] [phone] [place] [address], [place] [phone]    Dr. Adele Schilder  [phone]   [facility] Dept. 1) [address] [place] 2) [place] 3)  [address], [place] [phone]  [phone]   [place] [phone] or [phone] (After Hours)

## 2015-08-31 NOTE — ED Notes (Signed)
Attempted to discharge patient.  Patient told this nurse we cannot put her out without having a place to go.  Stated she has a house but her family told her she could not come back to the house until she gets help.  Has been on a 4 day cocaine binge.  Also stated her sister works at [employer] and she told the patient that we could send her somewhere.

## 2016-01-06 ENCOUNTER — Emergency Department (HOSPITAL_COMMUNITY)
Admission: EM | Admit: 2016-01-06 | Discharge: 2016-01-06 | Disposition: A | Discharge status: Home / Self Care | Payer: Medicaid Other | Attending: Emergency Medicine | Admitting: Emergency Medicine

## 2016-01-06 ENCOUNTER — Encounter (HOSPITAL_COMMUNITY): Payer: Self-pay | Admitting: *Deleted

## 2016-01-06 DIAGNOSIS — K0381 Cracked tooth: Secondary | ICD-10-CM | POA: Insufficient documentation

## 2016-01-06 DIAGNOSIS — K0889 Other specified disorders of teeth and supporting structures: Secondary | ICD-10-CM | POA: Insufficient documentation

## 2016-01-06 NOTE — ED Notes (Signed)
No answer when pt's name called in the lobby; unable to locate pt; pt eloped from waiting room

## 2016-01-06 NOTE — ED Notes (Signed)
No answer when pt's name called in the lobby

## 2016-01-06 NOTE — ED Notes (Signed)
Pt c/o dental pain x 2 days; pt states that she has a broken tooth to right lower jaw; pt states that she has increased pain when she eats or drinks; pt states that she thinks she has an infected tooth and an exposed nerve

## 2016-03-05 ENCOUNTER — Emergency Department (HOSPITAL_COMMUNITY): Payer: Medicaid Other

## 2016-03-05 ENCOUNTER — Encounter (HOSPITAL_COMMUNITY): Payer: Self-pay | Admitting: Emergency Medicine

## 2016-03-05 ENCOUNTER — Emergency Department (HOSPITAL_COMMUNITY)
Admission: EM | Admit: 2016-03-05 | Discharge: 2016-03-05 | Disposition: A | Discharge status: Home / Self Care | Payer: Medicaid Other | Attending: Emergency Medicine | Admitting: Emergency Medicine

## 2016-03-05 DIAGNOSIS — Z91040 Latex allergy status: Secondary | ICD-10-CM | POA: Insufficient documentation

## 2016-03-05 DIAGNOSIS — Z8751 Personal history of pre-term labor: Secondary | ICD-10-CM | POA: Insufficient documentation

## 2016-03-05 DIAGNOSIS — S8001XA Contusion of right knee, initial encounter: Secondary | ICD-10-CM | POA: Insufficient documentation

## 2016-03-05 DIAGNOSIS — Y9389 Activity, other specified: Secondary | ICD-10-CM | POA: Insufficient documentation

## 2016-03-05 DIAGNOSIS — Y9289 Other specified places as the place of occurrence of the external cause: Secondary | ICD-10-CM | POA: Insufficient documentation

## 2016-03-05 DIAGNOSIS — Z791 Long term (current) use of non-steroidal anti-inflammatories (NSAID): Secondary | ICD-10-CM | POA: Insufficient documentation

## 2016-03-05 DIAGNOSIS — W108XXA Fall (on) (from) other stairs and steps, initial encounter: Secondary | ICD-10-CM | POA: Insufficient documentation

## 2016-03-05 DIAGNOSIS — S300XXA Contusion of lower back and pelvis, initial encounter: Secondary | ICD-10-CM | POA: Insufficient documentation

## 2016-03-05 DIAGNOSIS — Y998 Other external cause status: Secondary | ICD-10-CM | POA: Insufficient documentation

## 2016-03-05 MED ORDER — TRAMADOL HCL 50 MG PO TABS: 50.0000 mg | ORAL_TABLET | Freq: Four times a day (QID) | ORAL | Status: DC | PRN

## 2016-03-05 MED ORDER — TRAMADOL HCL 50 MG PO TABS
50.0000 mg | ORAL_TABLET | Freq: Once | ORAL | Status: AC
  Administered 2016-03-05: 50 mg via ORAL
  Filled 2016-03-05: qty 1

## 2016-03-05 MED ORDER — IBUPROFEN 600 MG PO TABS: 600.0000 mg | ORAL_TABLET | Freq: Four times a day (QID) | ORAL | Status: DC | PRN

## 2016-03-05 NOTE — ED Notes (Signed)
Patient here with complaints of fall yesterday down stairs. Reports lower back pain and right knee pain. Pain 8/10.

## 2016-03-05 NOTE — Discharge Instructions (Signed)
Contusion A contusion is a deep bruise. Contusions are the result of a blunt injury to tissues and muscle fibers under the skin. The injury causes bleeding under the skin. The skin overlying the contusion may turn blue, purple, or yellow. Minor injuries will give you a painless contusion, but more severe contusions may stay painful and swollen for a few weeks.  CAUSES  This condition is usually caused by a blow, trauma, or direct force to an area of the body. SYMPTOMS  Symptoms of this condition include:  Swelling of the injured area.  Pain and tenderness in the injured area.  Discoloration. The area may have redness and then turn blue, purple, or yellow. DIAGNOSIS  This condition is diagnosed based on a physical exam and medical history. An X-ray, CT scan, or MRI may be needed to determine if there are any associated injuries, such as broken bones (fractures). TREATMENT  Specific treatment for this condition depends on what area of the body was injured. In general, the best treatment for a contusion is resting, icing, applying pressure to (compression), and elevating the injured area. This is often called the RICE strategy. Over-the-counter anti-inflammatory medicines may also be recommended for pain control.  HOME CARE INSTRUCTIONS   Rest the injured area.  If directed, apply ice to the injured area:  Put ice in a plastic bag.  Place a towel between your skin and the bag.  Leave the ice on for 20 minutes, 2-3 times per day.  If directed, apply light compression to the injured area using an elastic bandage. Make sure the bandage is not wrapped too tightly. Remove and reapply the bandage as directed by your health care provider.  If possible, raise (elevate) the injured area above the level of your heart while you are sitting or lying down.  Take over-the-counter and prescription medicines only as told by your health care provider. SEEK MEDICAL CARE IF:  Your symptoms do not  improve after several days of treatment.  Your symptoms get worse.  You have difficulty moving the injured area. SEEK IMMEDIATE MEDICAL CARE IF:   You have severe pain.  You have numbness in a hand or foot.  Your hand or foot turns pale or cold.   This information is not intended to replace advice given to you by your health care provider. Make sure you discuss any questions you have with your health care provider.   Document Released: 08/18/2005 Document Revised: 07/30/2015 Document Reviewed: 03/26/2015 Elsevier Interactive Patient Education 2016 Elsevier Inc. Fall Prevention in the Home  Falls can cause injuries and can affect people from all age groups. There are many simple things that you can do to make your home safe and to help prevent falls. WHAT CAN I DO ON THE OUTSIDE OF MY HOME?  Regularly repair the edges of walkways and driveways and fix any cracks.  Remove high doorway thresholds.  Trim any shrubbery on the main path into your home.  Use bright outdoor lighting.  Clear walkways of debris and clutter, including tools and rocks.  Regularly check that handrails are securely fastened and in good repair. Both sides of any steps should have handrails.  Install guardrails along the edges of any raised decks or porches.  Have leaves, snow, and ice cleared regularly.  Use sand or salt on walkways during winter months.  In the garage, clean up any spills right away, including grease or oil spills. WHAT CAN I DO IN THE BATHROOM?  Use night lights.    Install grab bars by the toilet and in the tub and shower. Do not use towel bars as grab bars.  Use non-skid mats or decals on the floor of the tub or shower.  If you need to sit down while you are in the shower, use a plastic, non-slip stool..  Keep the floor dry. Immediately clean up any water that spills on the floor.  Remove soap buildup in the tub or shower on a regular basis.  Attach bath mats securely with  double-sided non-slip rug tape.  Remove throw rugs and other tripping hazards from the floor. WHAT CAN I DO IN THE BEDROOM?  Use night lights.  Make sure that a bedside light is easy to reach.  Do not use oversized bedding that drapes onto the floor.  Have a firm chair that has side arms to use for getting dressed.  Remove throw rugs and other tripping hazards from the floor. WHAT CAN I DO IN THE KITCHEN?   Clean up any spills right away.  Avoid walking on wet floors.  Place frequently used items in easy-to-reach places.  If you need to reach for something above you, use a sturdy step stool that has a grab bar.  Keep electrical cables out of the way.  Do not use floor polish or wax that makes floors slippery. If you have to use wax, make sure that it is non-skid floor wax.  Remove throw rugs and other tripping hazards from the floor. WHAT CAN I DO IN THE STAIRWAYS?  Do not leave any items on the stairs.  Make sure that there are handrails on both sides of the stairs. Fix handrails that are broken or loose. Make sure that handrails are as long as the stairways.  Check any carpeting to make sure that it is firmly attached to the stairs. Fix any carpet that is loose or worn.  Avoid having throw rugs at the top or bottom of stairways, or secure the rugs with carpet tape to prevent them from moving.  Make sure that you have a light switch at the top of the stairs and the bottom of the stairs. If you do not have them, have them installed. WHAT ARE SOME OTHER FALL PREVENTION TIPS?  Wear closed-toe shoes that fit well and support your feet. Wear shoes that have rubber soles or low heels.  When you use a stepladder, make sure that it is completely opened and that the sides are firmly locked. Have someone hold the ladder while you are using it. Do not climb a closed stepladder.  Add color or contrast paint or tape to grab bars and handrails in your home. Place contrasting color  strips on the first and last steps.  Use mobility aids as needed, such as canes, walkers, scooters, and crutches.  Turn on lights if it is dark. Replace any light bulbs that burn out.  Set up furniture so that there are clear paths. Keep the furniture in the same spot.  Fix any uneven floor surfaces.  Choose a carpet design that does not hide the edge of steps of a stairway.  Be aware of any and all pets.  Review your medicines with your healthcare provider. Some medicines can cause dizziness or changes in blood pressure, which increase your risk of falling. Talk with your health care provider about other ways that you can decrease your risk of falls. This may include working with a physical therapist or trainer to improve your strength, balance, and endurance.     This information is not intended to replace advice given to you by your health care provider. Make sure you discuss any questions you have with your health care provider.   Document Released: 10/29/2002 Document Revised: 03/25/2015 Document Reviewed: 12/13/2014 Elsevier Interactive Patient Education 2016 Elsevier Inc.  

## 2016-03-05 NOTE — ED Provider Notes (Signed)
CSN: [id number]     Arrival date & time 03/05/16  1319 History   First MD Initiated Contact with Patient 03/05/16 [phone]     Chief Complaint  Patient presents with  . Fall  . Back Pain  . Knee Pain     (Consider location/radiation/quality/duration/timing/severity/associated sxs/prior Treatment) Patient is a 45 y.o. female presenting with fall, back pain, and knee pain. The history is provided by the patient. No language interpreter was used.  Fall This is a new problem. The current episode started yesterday. The problem occurs constantly. The problem has been gradually worsening. Associated symptoms include joint swelling. Pertinent negatives include no neck pain. Nothing aggravates the symptoms. She has tried nothing for the symptoms. The treatment provided moderate relief.  Back Pain Knee Pain Associated symptoms: back pain   Associated symptoms: no neck pain   Pt reports she fell down 4 steps yesterday.  Pt complains of pain in her low back.  Pt reports she hit and twisted her knee.   Pt hit her back.  Pt has been able to walk with a limp.  Pt denies any other complaints. No impact of her head.  Past Medical History  Diagnosis Date  . Preterm labor   . History of allergic reaction 02/26/2013  . Torn Achilles tendon   . Cocaine abuse    Past Surgical History  Procedure Laterality Date  . No past surgeries    . Dilation and evacuation    . Wisdom tooth extraction     Family History  Problem Relation Age of Onset  . Other Neg Hx   . Cerebral palsy Son     born at [redacted] weeks gestation   Social History  Substance Use Topics  . Smoking status: Never Smoker   . Smokeless tobacco: None  . Alcohol Use: No   OB History    Gravida Para Term Preterm AB TAB SAB Ectopic Multiple Living   6 4 3 1 2 1 1   4      Review of Systems  Musculoskeletal: Positive for back pain and joint swelling. Negative for neck pain.  All other systems reviewed and are negative.     Allergies  Bee  venom; Hydrocodone-acetaminophen; Latex; and Tomato  Home Medications   Prior to Admission medications   Medication Sig Start Date End Date Taking? Authorizing Provider  acetaminophen (TYLENOL) 500 MG tablet Take 1,000 mg by mouth every 6 (six) hours as needed for moderate pain (pain).    Historical Provider, MD  diclofenac (VOLTAREN) 50 MG EC tablet Take 1 tablet (50 mg total) by mouth 2 (two) times daily. 05/12/15   Hope Bunnie Pion, NP  hydrOXYzine (ATARAX/VISTARIL) 25 MG tablet Take 1 tablet (25 mg total) by mouth every 6 (six) hours as needed for itching. 06/16/15   Dorie Rank, MD  ibuprofen (ADVIL,MOTRIN) 400 MG tablet Take 1 tablet (400 mg total) by mouth every 6 (six) hours as needed. 08/31/15   Varney Biles, MD  traMADol (ULTRAM) 50 MG tablet Take 1 tablet (50 mg total) by mouth every 6 (six) hours as needed. 05/28/15   Glendell Docker, NP   BP 116/63 mmHg  Pulse 66  Temp(Src) 97.7 F (36.5 C) (Oral)  Resp 18  SpO2 97%  LMP 03/02/2016 Physical Exam  Constitutional: She is oriented to person, place, and time. She appears well-developed and well-nourished.  HENT:  Head: Normocephalic and atraumatic.  Eyes: EOM are normal. Pupils are equal, round, and reactive to light.  Neck:  Normal range of motion.  Pulmonary/Chest: Effort normal.  Abdominal: She exhibits no distension.  Musculoskeletal: She exhibits tenderness.  Tender right knee,  Slight swelling, nv and ns intact.  Pain with range of motion, Tender lower back prevertebrals. Diffusely tender lumbar and sacral spine, good range of motion    Neurological: She is alert and oriented to person, place, and time.  Skin: Skin is warm.  Psychiatric: She has a normal mood and affect.  Nursing note and vitals reviewed.   ED Course  Procedures (including critical care time) Labs Review Labs Reviewed - No data to display  Imaging Review Dg Lumbar Spine Complete  03/05/2016  CLINICAL DATA:  Injured back moving furniture yesterday.  EXAM: LUMBAR SPINE - COMPLETE 4+ VIEW COMPARISON:  01/12/2014. FINDINGS: Normal alignment of the lumbar vertebral bodies. Disc spaces and vertebral bodies are maintained. The facets are normally aligned. No pars defects. The visualized bony pelvis is intact. IMPRESSION: Normal lumbar spine series. Electronically Signed   By: Marijo Sanes M.D.   On: 03/05/2016 15:49   Dg Knee Complete 4 Views Right  03/05/2016  CLINICAL DATA:  Low back injury yesterday while moving furniture; patient notes that the pain runs down the right leg to the knee and has pain just inferior to the right patella. EXAM: RIGHT KNEE - COMPLETE 4+ VIEW COMPARISON:  None in PACs FINDINGS: The bones of the right knee are adequately mineralized. The joint spaces are preserved. There is mild beaking of the tibial spines. There is no joint effusion. There is no acute fracture nor dislocation. IMPRESSION: There is mild degenerative beaking of the tibial spines. There is no acute bony abnormality. Electronically Signed   By: David  Martinique M.D.   On: 03/05/2016 15:49   I have personally reviewed and evaluated these images and lab results as part of my medical decision-making.   EKG Interpretation None      MDM   Final diagnoses:  Contusion of right knee, initial encounter  Contusion of lower back, initial encounter    Meds ordered this encounter  Medications  . ibuprofen (ADVIL,MOTRIN) 600 MG tablet    Sig: Take 1 tablet (600 mg total) by mouth every 6 (six) hours as needed.    Dispense:  30 tablet    Refill:  0    Order Specific Question:  Supervising Provider    Answer:  MILLER, BRIAN [[id number]]  . traMADol (ULTRAM) 50 MG tablet    Sig: Take 1 tablet (50 mg total) by mouth every 6 (six) hours as needed.    Dispense:  15 tablet    Refill:  0    Order Specific Question:  Supervising Provider    Answer:  Noemi Chapel [[id number]]  An After Visit Summary was printed and given to the patient.    Hollace Kinnier [place], PA-C 03/05/16  1658  Harvel Quale, MD 03/07/16 [phone]

## 2016-08-15 ENCOUNTER — Encounter (HOSPITAL_COMMUNITY): Payer: Self-pay | Admitting: Emergency Medicine

## 2016-08-15 ENCOUNTER — Emergency Department (HOSPITAL_COMMUNITY)
Admission: EM | Admit: 2016-08-15 | Discharge: 2016-08-15 | Disposition: A | Discharge status: Home / Self Care | Payer: Medicaid Other | Attending: Emergency Medicine | Admitting: Emergency Medicine

## 2016-08-15 DIAGNOSIS — Y999 Unspecified external cause status: Secondary | ICD-10-CM | POA: Insufficient documentation

## 2016-08-15 DIAGNOSIS — W108XXA Fall (on) (from) other stairs and steps, initial encounter: Secondary | ICD-10-CM | POA: Insufficient documentation

## 2016-08-15 DIAGNOSIS — W19XXXA Unspecified fall, initial encounter: Secondary | ICD-10-CM

## 2016-08-15 DIAGNOSIS — Z91040 Latex allergy status: Secondary | ICD-10-CM | POA: Insufficient documentation

## 2016-08-15 DIAGNOSIS — Y939 Activity, unspecified: Secondary | ICD-10-CM | POA: Insufficient documentation

## 2016-08-15 DIAGNOSIS — S300XXA Contusion of lower back and pelvis, initial encounter: Secondary | ICD-10-CM

## 2016-08-15 DIAGNOSIS — Y929 Unspecified place or not applicable: Secondary | ICD-10-CM | POA: Insufficient documentation

## 2016-08-15 MED ORDER — NAPROXEN 250 MG PO TABS: 250.0000 mg | ORAL_TABLET | Freq: Two times a day (BID) | ORAL | 0 refills | Status: DC

## 2016-08-15 MED ORDER — METHOCARBAMOL 500 MG PO TABS: 500.0000 mg | ORAL_TABLET | Freq: Two times a day (BID) | ORAL | 0 refills | Status: DC | PRN

## 2016-08-15 MED ORDER — OXYCODONE-ACETAMINOPHEN 5-325 MG PO TABS
1.0000 | ORAL_TABLET | Freq: Once | ORAL | Status: AC
  Administered 2016-08-15: 1 via ORAL
  Filled 2016-08-15: qty 1

## 2016-08-15 NOTE — ED Notes (Signed)
Pt given graham crackers and peanut butter as requested.  

## 2016-08-15 NOTE — ED Notes (Signed)
Mariea Clonts, PA, in w/pt.

## 2016-08-15 NOTE — ED Triage Notes (Signed)
Pt presents to ED for assessment of back pain after pt has a trip and fall down approx 5 steps while she was moving furniture.  Pt struck her back on the steps.  Denies LOC.  Pt sts increased back pain with bilateral leg pain and "toe swelling".  Pt sts pain increases with movement or palpation.  Denies changes to bowel or bladder.

## 2016-08-15 NOTE — ED Provider Notes (Signed)
[facility] DEPT Provider Note   CSN: [id number] Arrival date & time: 08/15/16  1526  By signing my name below, I, Soijett Blue, attest that this documentation has been prepared under the direction and in the presence of Will Sheril Hammond, PA-C Electronically Signed: Soijett Blue, ED Scribe. 08/15/16. 5:53 PM.   History   Chief Complaint Chief Complaint  Patient presents with  . Back Pain   HPI  Beth Jacobs is a 45 y.o. female  who presents to the Emergency Department complaining of lower back pain onset 2 days. Pt notes that she tripped and fell down 5 steps while moving furniture, striking her tailbone on the steps. She reports that the back pain does radiate to her BLE. Pain is worse with sitting. She states that she has tried 600 mg ibuprofen and tylenol with her last dose being today with no relief for her symptoms. Pt denies bowel/bladder incontinence, dysuria, difficulty urinating, fever, numbness to LE, tingling to LE, weakness, vomiting, diarrhea, abdominal pain, and any other symptoms. Denies CA or IV drug use. Patient's last menstrual period was 08/12/2016.    The history is provided by the patient. No language interpreter was used.    Past Medical History:  Diagnosis Date  . Cocaine abuse   . History of allergic reaction 02/26/2013  . Preterm labor   . Torn Achilles tendon     Patient Active Problem List   Diagnosis Date Noted  . History of allergic reaction 02/26/2013  . Morbid obesity ([place]) 02/26/2013  . Facial cellulitis 02/25/2013  . Anemia 02/25/2013  . AMA (advanced maternal age) multigravida 35+ 10/26/2012  . History of preterm delivery, currently pregnant 10/26/2012  . Drug abuse 10/26/2012  . Prenatal care insufficient 10/26/2012    Past Surgical History:  Procedure Laterality Date  . DILATION AND EVACUATION    . NO PAST SURGERIES    . WISDOM TOOTH EXTRACTION      OB History    Gravida Para Term Preterm AB Living   6 4 3 1 2 4    SAB TAB Ectopic  Multiple Live Births   1 1     4        Home Medications    Prior to Admission medications   Medication Sig Start Date End Date Taking? Authorizing Provider  acetaminophen (TYLENOL) 500 MG tablet Take 1,000 mg by mouth every 6 (six) hours as needed for moderate pain (pain).    Historical Provider, MD  diclofenac (VOLTAREN) 50 MG EC tablet Take 1 tablet (50 mg total) by mouth 2 (two) times daily. 05/12/15   Hope Bunnie Pion, NP  hydrOXYzine (ATARAX/VISTARIL) 25 MG tablet Take 1 tablet (25 mg total) by mouth every 6 (six) hours as needed for itching. 06/16/15   Dorie Rank, MD  methocarbamol (ROBAXIN) 500 MG tablet Take 1 tablet (500 mg total) by mouth 2 (two) times daily as needed for muscle spasms. 08/15/16   Waynetta Pean, PA-C  naproxen (NAPROSYN) 250 MG tablet Take 1 tablet (250 mg total) by mouth 2 (two) times daily with a meal. 08/15/16   Waynetta Pean, PA-C  traMADol (ULTRAM) 50 MG tablet Take 1 tablet (50 mg total) by mouth every 6 (six) hours as needed. 03/05/16   Fransico Meadow, PA-C    Family History Family History  Problem Relation Age of Onset  . Cerebral palsy Son     born at [redacted] weeks gestation  . Other Neg Hx     Social History Social History  Substance Use Topics  . Smoking status: Never Smoker  . Smokeless tobacco: Never Used  . Alcohol use No     Allergies   Bee venom; Hydrocodone-acetaminophen; Latex; and Tomato   Review of Systems Review of Systems  Constitutional: Negative for fever.  Gastrointestinal: Negative for abdominal pain, diarrhea and vomiting.       No bowel incontinence.   Genitourinary: Negative for difficulty urinating, dysuria, hematuria and urgency.       No bladder incontinence.   Musculoskeletal: Positive for back pain (lower). Negative for gait problem and joint swelling.  Skin: Negative for color change, rash and wound.  Neurological: Negative for weakness and numbness.     Physical Exam Updated Vital Signs BP (!) 99/41 (BP Location:  Right Arm)   Pulse 72   Temp 97.9 F (36.6 C) (Oral)   Resp 18   Ht 5\' 1"  (1.549 m)   Wt 72.6 kg   LMP 07/12/2016   SpO2 99%   BMI 30.23 kg/m   Physical Exam  Constitutional: She appears well-developed and well-nourished. No distress.  Non-toxic appearing.  HENT:  Head: Normocephalic and atraumatic.  Eyes: Conjunctivae are normal. Pupils are equal, round, and reactive to light. Right eye exhibits no discharge. Left eye exhibits no discharge.  Neck: Neck supple.  Cardiovascular: Normal rate, regular rhythm, normal heart sounds and intact distal pulses.   Pulmonary/Chest: Effort normal and breath sounds normal. No respiratory distress.  Abdominal: Soft. There is no tenderness.  Musculoskeletal: Normal range of motion. She exhibits tenderness. She exhibits no edema or deformity.       Lumbar back: She exhibits tenderness. She exhibits no bony tenderness and no edema.  Tenderness over coccyx with no midline back or neck tenderness. No back or coccyx deformity, edema, erythema, or ecchymosis. Good strength to BLE. No LE edema or tenderness.   Lymphadenopathy:    She has no cervical adenopathy.  Neurological: She is alert. She has normal reflexes. She displays normal reflexes. Coordination normal.  Bilateral patellar DTRs are intact. Normal gait. Sensation is intact to bilateral lower extremities.  Skin: Skin is warm and dry. Capillary refill takes less than 2 seconds. No rash noted. She is not diaphoretic.  Psychiatric: She has a normal mood and affect. Her behavior is normal.  Nursing note and vitals reviewed.    ED Treatments / Results  DIAGNOSTIC STUDIES: Oxygen Saturation is 99% on RA, nl by my interpretation.    COORDINATION OF CARE: 5:51 PM Discussed treatment plan with pt at bedside which includes naprosyn Rx, robaxin Rx, percocet, and pt agreed to plan.   Procedures Procedures (including critical care time)  Medications Ordered in ED Medications    oxyCODONE-acetaminophen (PERCOCET/ROXICET) 5-325 MG per tablet 1 tablet (1 tablet Oral Given 08/15/16 1804)     Initial Impression / Assessment and Plan / ED Course  I have reviewed the triage vital signs and the nursing notes.   Clinical Course      Pt presents to the ED s/p fall down 5 steps and landing on her tailbone two days ago. No neurological deficits and normal neuro exam.  Patient is ambulatory.  No loss of bowel or bladder control.  No concern for cauda equina.  No fever, night sweats, weight loss, h/o cancer, IVDA, no recent procedure to back. No urinary symptoms suggestive of UTI.  I offered imaging of her coccyx but the patient declined. I agree with this plan. Supportive care and return precaution discussed. Appears safe for  discharge at this time. Will discharge with prescriptions for naproxen and Robaxin. I encouraged close follow-up by her primary care doctor. I advised the patient to follow-up with their primary care provider this week. I advised the patient to return to the emergency department with new or worsening symptoms or new concerns. The patient verbalized understanding and agreement with plan.    Final Clinical Impressions(s) / ED Diagnoses   Final diagnoses:  Coccyx contusion, initial encounter  Fall, initial encounter    New Prescriptions New Prescriptions   METHOCARBAMOL (ROBAXIN) 500 MG TABLET    Take 1 tablet (500 mg total) by mouth 2 (two) times daily as needed for muscle spasms.   NAPROXEN (NAPROSYN) 250 MG TABLET    Take 1 tablet (250 mg total) by mouth 2 (two) times daily with a meal.    I personally performed the services described in this documentation, which was scribed in my presence. The recorded information has been reviewed and is accurate.          Waynetta Pean, PA-C 08/15/16 1820    Fatima Blank, MD 08/16/16 [phone]

## 2016-08-15 NOTE — ED Notes (Signed)
Pt appears sleepy. States she has not slept in 1.5 days and did not get sleepy until arrived to ED.

## 2017-04-25 ENCOUNTER — Emergency Department (HOSPITAL_COMMUNITY)
Admission: EM | Admit: 2017-04-25 | Discharge: 2017-04-25 | Disposition: A | Discharge status: Home / Self Care | Payer: Medicaid Other | Attending: Emergency Medicine | Admitting: Emergency Medicine

## 2017-04-25 ENCOUNTER — Encounter (HOSPITAL_COMMUNITY): Payer: Self-pay | Admitting: *Deleted

## 2017-04-25 DIAGNOSIS — Z760 Encounter for issue of repeat prescription: Secondary | ICD-10-CM | POA: Insufficient documentation

## 2017-04-25 DIAGNOSIS — J45909 Unspecified asthma, uncomplicated: Secondary | ICD-10-CM | POA: Insufficient documentation

## 2017-04-25 DIAGNOSIS — Z91040 Latex allergy status: Secondary | ICD-10-CM | POA: Insufficient documentation

## 2017-04-25 HISTORY — DX: Unspecified asthma, uncomplicated: J45.909

## 2017-04-25 MED ORDER — ALBUTEROL SULFATE HFA 108 (90 BASE) MCG/ACT IN AERS
1.0000 | INHALATION_SPRAY | Freq: Four times a day (QID) | RESPIRATORY_TRACT | 0 refills | Status: AC | PRN
Start: 2017-04-25 — End: ?

## 2017-04-25 MED ORDER — ALBUTEROL SULFATE HFA 108 (90 BASE) MCG/ACT IN AERS
1.0000 | INHALATION_SPRAY | Freq: Once | RESPIRATORY_TRACT | Status: AC
  Administered 2017-04-25: 1 via RESPIRATORY_TRACT
  Filled 2017-04-25: qty 6.7

## 2017-04-25 NOTE — ED Triage Notes (Signed)
Pt reports trying to get treatment at [facility] today for drug use, pt was using her inhaler and they told her to  Come here for a refill that had instructions and her name on it and then she will be able to return to [facility] tomorrow am. Denies SI or HI. No resp distress noted at triage.

## 2017-04-25 NOTE — ED Provider Notes (Signed)
[facility] DEPT Provider Note   CSN: [id number] Arrival date & time: 04/25/17  1746  By signing my name below, I, Levester Fresh, attest that this documentation has been prepared under the direction and in the presence of Virgel Manifold, MD . Electronically Signed: Levester Fresh, Scribe. 04/25/2017. 7:23 PM.  History   Chief Complaint Chief Complaint  Patient presents with  . Medication Refill   HPI Comments Beth Jacobs is a 46 y.o. female with a PMHx significant for drug abuse, who presents to the Emergency Department from [facility].  She is currently in drug rehab and used her inhaler earlier today, for which they require a written prescription.  They instructed her to come here for a refill with her name on it.  On exam, she endorses chest tightness and a non-productive cough.   No fever or chills.  No hx of asthma.   Hx of smoking marijuana and crack-cocaine.  Pt denies experiencing any other acute sx, including nausea or vomiting.   The history is provided by the patient and medical records. No language interpreter was used.   Past Medical History:  Diagnosis Date  . Asthma   . Cocaine abuse   . History of allergic reaction 02/26/2013  . Preterm labor   . Torn Achilles tendon     Patient Active Problem List   Diagnosis Date Noted  . History of allergic reaction 02/26/2013  . Morbid obesity ([place]) 02/26/2013  . Facial cellulitis 02/25/2013  . Anemia 02/25/2013  . AMA (advanced maternal age) multigravida 35+ 10/26/2012  . History of preterm delivery, currently pregnant 10/26/2012  . Drug abuse 10/26/2012  . Prenatal care insufficient 10/26/2012    Past Surgical History:  Procedure Laterality Date  . DILATION AND EVACUATION    . NO PAST SURGERIES    . WISDOM TOOTH EXTRACTION      OB History    Gravida Para Term Preterm AB Living   6 4 3 1 2 4    SAB TAB Ectopic Multiple Live Births   1 1     4        Home Medications    Prior to Admission medications     Medication Sig Start Date End Date Taking? Authorizing Provider  acetaminophen (TYLENOL) 500 MG tablet Take 1,000 mg by mouth every 6 (six) hours as needed for moderate pain (pain).    [provider]  diclofenac (VOLTAREN) 50 MG EC tablet Take 1 tablet (50 mg total) by mouth 2 (two) times daily. 05/12/15   Ashley Murrain, NP  hydrOXYzine (ATARAX/VISTARIL) 25 MG tablet Take 1 tablet (25 mg total) by mouth every 6 (six) hours as needed for itching. 06/16/15   Dorie Rank, MD  methocarbamol (ROBAXIN) 500 MG tablet Take 1 tablet (500 mg total) by mouth 2 (two) times daily as needed for muscle spasms. 08/15/16   Waynetta Pean, PA-C  naproxen (NAPROSYN) 250 MG tablet Take 1 tablet (250 mg total) by mouth 2 (two) times daily with a meal. 08/15/16   Waynetta Pean, PA-C  traMADol (ULTRAM) 50 MG tablet Take 1 tablet (50 mg total) by mouth every 6 (six) hours as needed. 03/05/16   Fransico Meadow, PA-C    Family History Family History  Problem Relation Age of Onset  . Cerebral palsy Son        born at [redacted] weeks gestation  . Other Neg Hx     Social History Social History  Substance Use Topics  . Smoking  status: Never Smoker  . Smokeless tobacco: Never Used  . Alcohol use No     Allergies   Bee venom; Hydrocodone-acetaminophen; Latex; and Tomato   Review of Systems Review of Systems  Constitutional: Negative for chills and fever.  Respiratory: Positive for chest tightness. Negative for shortness of breath.   Gastrointestinal: Negative for nausea and vomiting.    Physical Exam Updated Vital Signs BP 117/71   Pulse 94   Temp 98.4 F (36.9 C) (Oral)   Resp 18   SpO2 100%   Physical Exam  Constitutional: She is oriented to person, place, and time. She appears well-developed and well-nourished. No distress.  HENT:  Head: Normocephalic and atraumatic.  Eyes: EOM are normal.  Neck: Normal range of motion.  Cardiovascular: Normal rate, regular rhythm and normal heart sounds.    Pulmonary/Chest: Effort normal and breath sounds normal.  Abdominal: Soft. She exhibits no distension. There is no tenderness.  Musculoskeletal: Normal range of motion.  Neurological: She is alert and oriented to person, place, and time.  Skin: Skin is warm and dry.  Psychiatric: She has a normal mood and affect. Judgment normal.  Nursing note and vitals reviewed.  ED Treatments / Results  DIAGNOSTIC STUDIES: Oxygen Saturation is 100% on room air, normal by my interpretation.    COORDINATION OF CARE: 7:33 PM Discussed treatment plan with pt at bedside and pt agreed to plan.  Labs (all labs ordered are listed, but only abnormal results are displayed) Labs Reviewed - No data to display  EKG  EKG Interpretation None       Radiology No results found.  Procedures Procedures (including critical care time)  Medications Ordered in ED Medications - No data to display   Initial Impression / Assessment and Plan / ED Course  I have reviewed the triage vital signs and the nursing notes.  Pertinent labs & imaging results that were available during my care of the patient were reviewed by me and considered in my medical decision making (see chart for details).    Med refill. Commended on getting help. Nothing more to say.   I personally preformed the services scribed in my presence. The recorded information has been reviewed is accurate. Virgel Manifold, MD.  Final Clinical Impressions(s) / ED Diagnoses   Final diagnoses:  Medication refill    New Prescriptions New Prescriptions   No medications on file     Virgel Manifold, MD 05/02/17 1050

## 2017-04-25 NOTE — Discharge Instructions (Signed)
Ms Beth Jacobs needs her albuterol inhaler for reactive airway disease. She is instructed to use it every 6 hours as needed for shortness of breath or wheezing.

## 2017-08-18 ENCOUNTER — Ambulatory Visit: Payer: Self-pay | Admitting: Obstetrics & Gynecology

## 2018-10-03 ENCOUNTER — Ambulatory Visit (INDEPENDENT_AMBULATORY_CARE_PROVIDER_SITE_OTHER): Payer: Medicaid Other | Admitting: Obstetrics

## 2018-10-03 ENCOUNTER — Other Ambulatory Visit (HOSPITAL_COMMUNITY)
Admission: RE | Admit: 2018-10-03 | Discharge: 2018-10-03 | Disposition: A | Discharge status: Home / Self Care | Payer: Medicaid Other | Source: Ambulatory Visit | Attending: Obstetrics | Admitting: Obstetrics

## 2018-10-03 ENCOUNTER — Encounter: Payer: Self-pay | Admitting: Obstetrics

## 2018-10-03 VITALS — BP 121/75 | HR 78 | Ht 61.0 in | Wt 181.0 lb

## 2018-10-03 DIAGNOSIS — Z113 Encounter for screening for infections with a predominantly sexual mode of transmission: Secondary | ICD-10-CM

## 2018-10-03 DIAGNOSIS — N898 Other specified noninflammatory disorders of vagina: Secondary | ICD-10-CM

## 2018-10-03 DIAGNOSIS — Z01419 Encounter for gynecological examination (general) (routine) without abnormal findings: Secondary | ICD-10-CM

## 2018-10-03 DIAGNOSIS — Z124 Encounter for screening for malignant neoplasm of cervix: Secondary | ICD-10-CM | POA: Insufficient documentation

## 2018-10-03 DIAGNOSIS — N760 Acute vaginitis: Secondary | ICD-10-CM

## 2018-10-03 DIAGNOSIS — Z0000 Encounter for general adult medical examination without abnormal findings: Secondary | ICD-10-CM | POA: Diagnosis not present

## 2018-10-03 DIAGNOSIS — E669 Obesity, unspecified: Secondary | ICD-10-CM

## 2018-10-03 DIAGNOSIS — N946 Dysmenorrhea, unspecified: Secondary | ICD-10-CM

## 2018-10-03 DIAGNOSIS — B9689 Other specified bacterial agents as the cause of diseases classified elsewhere: Secondary | ICD-10-CM

## 2018-10-03 DIAGNOSIS — F32A Depression, unspecified: Secondary | ICD-10-CM

## 2018-10-03 DIAGNOSIS — Z1239 Encounter for other screening for malignant neoplasm of breast: Secondary | ICD-10-CM

## 2018-10-03 DIAGNOSIS — F329 Major depressive disorder, single episode, unspecified: Secondary | ICD-10-CM

## 2018-10-03 MED ORDER — METRONIDAZOLE 500 MG PO TABS
500.0000 mg | ORAL_TABLET | Freq: Two times a day (BID) | ORAL | 2 refills | Status: DC
Start: 2018-10-03 — End: 2019-04-16

## 2018-10-03 MED ORDER — IBUPROFEN 800 MG PO TABS: 800.0000 mg | ORAL_TABLET | Freq: Three times a day (TID) | ORAL | 5 refills | Status: DC | PRN

## 2018-10-03 NOTE — Progress Notes (Signed)
Subjective:        Beth Jacobs is a 47 y.o. female here for a routine exam.  Current complaints: Painful period cramps and malodorous vaginal discharge.   Personal health questionnaire:  Is patient Ashkenazi Jewish, have a family history of breast and/or ovarian cancer: no Is there a family history of uterine cancer diagnosed at age < 89, gastrointestinal cancer, urinary tract cancer, family member who is a Field seismologist syndrome-associated carrier: no Is the patient overweight and hypertensive, family history of diabetes, personal history of gestational diabetes, preeclampsia or PCOS: no Is patient over 41, have PCOS,  family history of premature CHD under age 40, diabetes, smoke, have hypertension or peripheral artery disease:  no At any time, has a partner hit, kicked or otherwise hurt or frightened you?: no Over the past 2 weeks, have you felt down, depressed or hopeless?: no Over the past 2 weeks, have you felt little interest or pleasure in doing things?:no   Gynecologic History Patient's last menstrual period was 09/13/2018 (exact date). Contraception: none Last Pap: 2 years ago. Results were: normal Last mammogram: none. Results were: none  Obstetric History OB History  Gravida Para Term Preterm AB Living  6 4 3 1 2 4   SAB TAB Ectopic Multiple Live Births  1 1     4     # Outcome Date GA Lbr Len/2nd Weight Sex Delivery Anes PTL Lv  6 Term 11/26/12 [redacted]w[redacted]d 13:31 / 00:23 7 lb 15.2 oz (3.606 kg) F Vag-Spont EPI  LIV  5 Term 2002    M Vag-Spont   LIV  4 Preterm 2000 [redacted]w[redacted]d   M Vag-Spont   LIV  3 Term 9    F Vag-Spont   LIV  2 SAB  [redacted]w[redacted]d       FD  1 TAB             Past Medical History:  Diagnosis Date  . Asthma   . Cocaine abuse ([place])   . History of allergic reaction 02/26/2013  . Preterm labor   . Torn Achilles tendon     Past Surgical History:  Procedure Laterality Date  . DILATION AND EVACUATION    . NO PAST SURGERIES    . WISDOM TOOTH EXTRACTION       Current  Outpatient Medications:  .  acetaminophen (TYLENOL) 500 MG tablet, Take 1,000 mg by mouth every 6 (six) hours as needed for moderate pain (pain)., Disp: , Rfl:  .  albuterol (PROVENTIL HFA;VENTOLIN HFA) 108 (90 Base) MCG/ACT inhaler, Inhale 1-2 puffs into the lungs every 6 (six) hours as needed for wheezing or shortness of breath., Disp: 1 Inhaler, Rfl: 0 .  citalopram (CELEXA) 20 MG tablet, Take 20 mg by mouth daily., Disp: , Rfl:  .  diclofenac (VOLTAREN) 50 MG EC tablet, Take 1 tablet (50 mg total) by mouth 2 (two) times daily., Disp: 15 tablet, Rfl: 0 .  hydrOXYzine (ATARAX/VISTARIL) 25 MG tablet, Take 1 tablet (25 mg total) by mouth every 6 (six) hours as needed for itching., Disp: 30 tablet, Rfl: 0 .  ibuprofen (ADVIL,MOTRIN) 800 MG tablet, Take 1 tablet (800 mg total) by mouth every 8 (eight) hours as needed., Disp: 30 tablet, Rfl: 5 .  methocarbamol (ROBAXIN) 500 MG tablet, Take 1 tablet (500 mg total) by mouth 2 (two) times daily as needed for muscle spasms., Disp: 20 tablet, Rfl: 0 .  metroNIDAZOLE (FLAGYL) 500 MG tablet, Take 1 tablet (500 mg total) by mouth 2 (  two) times daily., Disp: 14 tablet, Rfl: 2 .  naproxen (NAPROSYN) 250 MG tablet, Take 1 tablet (250 mg total) by mouth 2 (two) times daily with a meal. (Patient not taking: Reported on 10/03/2018), Disp: 30 tablet, Rfl: 0 .  traMADol (ULTRAM) 50 MG tablet, Take 1 tablet (50 mg total) by mouth every 6 (six) hours as needed., Disp: 15 tablet, Rfl: 0 Allergies  Allergen Reactions  . Bee Venom Anaphylaxis    "Affects oxygen level"  . Hydrocodone-Acetaminophen Nausea And Vomiting    Patient can take tylenol products and oxycodone , its only hydrocodone that causes issues  . Latex Hives  . Tomato Swelling    Social History   Tobacco Use  . Smoking status: Never Smoker  . Smokeless tobacco: Never Used  Substance Use Topics  . Alcohol use: No    Family History  Problem Relation Age of Onset  . Cerebral palsy Son        born  at [redacted] weeks gestation  . Cervical cancer Mother   . Other Neg Hx       Review of Systems  Constitutional: negative for fatigue and weight loss Respiratory: negative for cough and wheezing Cardiovascular: negative for chest pain, fatigue and palpitations Gastrointestinal: negative for abdominal pain and change in bowel habits Musculoskeletal:negative for myalgias Neurological: negative for gait problems and tremors Behavioral/Psych: negative for abusive relationship, depression Endocrine: negative for temperature intolerance    Genitourinary:POSITIVE for abnormal menstrual periods with severe cramping, and vaginal discharge with odor Integument/breast: negative for breast lump, breast tenderness, nipple discharge and skin lesion(s)    Objective:       BP 121/75   Pulse 78   Ht 5\' 1"  (1.549 m)   Wt 181 lb (82.1 kg)   LMP 09/13/2018 (Exact Date)   BMI 34.20 kg/m  General:   alert  Skin:   no rash or abnormalities  Lungs:   clear to auscultation bilaterally  Heart:   regular rate and rhythm, S1, S2 normal, no murmur, click, rub or gallop  Breasts:   normal without suspicious masses, skin or nipple changes or axillary nodes  Abdomen:  normal findings: no organomegaly, soft, non-tender and no hernia  Pelvis:  External genitalia: normal general appearance Urinary system: urethral meatus normal and bladder without fullness, nontender Vaginal: normal without tenderness, induration or masses Cervix: normal appearance Adnexa: normal bimanual exam Uterus: anteverted and non-tender, normal size   Lab Review Urine pregnancy test Labs reviewed yes Radiologic studies reviewed no  50% of 20 min visit spent on counseling and coordination of care.   Assessment:      1. Encounter for routine gynecological examination with Papanicolaou smear of cervix  2. Vaginal discharge Rx: - Cervicovaginal ancillary only  3. Screening for STD (sexually transmitted disease) Rx: - HIV  Antibody (routine testing w rflx) - RPR  4. BV (bacterial vaginosis) Rx: - metroNIDAZOLE (FLAGYL) 500 MG tablet; Take 1 tablet (500 mg total) by mouth 2 (two) times daily.  Dispense: 14 tablet; Refill: 2  5. Dysmenorrhea Rx: - ibuprofen (ADVIL,MOTRIN) 800 MG tablet; Take 1 tablet (800 mg total) by mouth every 8 (eight) hours as needed.  Dispense: 30 tablet; Refill: 5  6. Screening breast examination Rx: - MM Digital Screening; Future  7. Obesity (BMI 30.0-34.9) - recommended program of caloric reduction, exercise and behavioral modification  8. Depression, unspecified depression type Rx: - citalopram (CELEXA) 20 MG tablet; Take 20 mg by mouth daily.    Plan:  Education reviewed: calcium supplements, depression evaluation, low fat, low cholesterol diet, safe sex/STD prevention, self breast exams and weight bearing exercise. Contraception: none. Mammogram ordered. Follow up in: 1 year.   Meds ordered this encounter  Medications  . ibuprofen (ADVIL,MOTRIN) 800 MG tablet    Sig: Take 1 tablet (800 mg total) by mouth every 8 (eight) hours as needed.    Dispense:  30 tablet    Refill:  5  . metroNIDAZOLE (FLAGYL) 500 MG tablet    Sig: Take 1 tablet (500 mg total) by mouth 2 (two) times daily.    Dispense:  14 tablet    Refill:  2   Orders Placed This Encounter  Procedures  . MM Digital Screening    Standing Status:   Future    Standing Expiration Date:   12/04/2019    Order Specific Question:   Reason for Exam (SYMPTOM  OR DIAGNOSIS REQUIRED)    Answer:   screening    Order Specific Question:   Is the patient pregnant?    Answer:   No    Order Specific Question:   Preferred imaging location?    Answer:   [facility]  . HIV Antibody (routine testing w rflx)  . RPR     Shelly Bombard MD 10-03-2018

## 2018-10-03 NOTE — Patient Instructions (Signed)

## 2018-10-03 NOTE — Progress Notes (Signed)
Patient present for her Annual Exam today.  CC: painful cramps w/ periods  LMP: 09/13/18 Last pap: 2 yrs ago per pt  Mammogram: Never  STD Screening: HIV, GC/CT, RPR

## 2018-10-03 NOTE — Addendum Note (Signed)
Addended by: Courtney Heys on: 10/03/2018 01:59 PM   Modules accepted: Orders

## 2018-10-04 LAB — HIV ANTIBODY (ROUTINE TESTING W REFLEX): HIV Screen 4th Generation wRfx: NONREACTIVE

## 2018-10-04 LAB — CERVICOVAGINAL ANCILLARY ONLY
Chlamydia: NEGATIVE
Neisseria Gonorrhea: NEGATIVE

## 2018-10-04 LAB — RPR: RPR Ser Ql: NONREACTIVE

## 2018-10-05 LAB — CYTOLOGY - PAP
Diagnosis: NEGATIVE
HPV: NOT DETECTED

## 2018-10-06 ENCOUNTER — Other Ambulatory Visit: Payer: Self-pay | Admitting: Obstetrics

## 2019-04-16 ENCOUNTER — Encounter (HOSPITAL_COMMUNITY): Payer: Self-pay | Admitting: Emergency Medicine

## 2019-04-16 ENCOUNTER — Emergency Department (HOSPITAL_COMMUNITY)
Admission: EM | Admit: 2019-04-16 | Discharge: 2019-04-16 | Disposition: A | Discharge status: Home / Self Care | Payer: Medicaid Other | Attending: Emergency Medicine | Admitting: Emergency Medicine

## 2019-04-16 ENCOUNTER — Other Ambulatory Visit: Payer: Self-pay

## 2019-04-16 DIAGNOSIS — Y999 Unspecified external cause status: Secondary | ICD-10-CM | POA: Insufficient documentation

## 2019-04-16 DIAGNOSIS — T192XXA Foreign body in vulva and vagina, initial encounter: Secondary | ICD-10-CM

## 2019-04-16 DIAGNOSIS — X58XXXA Exposure to other specified factors, initial encounter: Secondary | ICD-10-CM | POA: Insufficient documentation

## 2019-04-16 DIAGNOSIS — Z79899 Other long term (current) drug therapy: Secondary | ICD-10-CM | POA: Insufficient documentation

## 2019-04-16 DIAGNOSIS — Y939 Activity, unspecified: Secondary | ICD-10-CM | POA: Insufficient documentation

## 2019-04-16 DIAGNOSIS — J45909 Unspecified asthma, uncomplicated: Secondary | ICD-10-CM | POA: Insufficient documentation

## 2019-04-16 DIAGNOSIS — Z5329 Procedure and treatment not carried out because of patient's decision for other reasons: Secondary | ICD-10-CM | POA: Insufficient documentation

## 2019-04-16 DIAGNOSIS — B9689 Other specified bacterial agents as the cause of diseases classified elsewhere: Secondary | ICD-10-CM

## 2019-04-16 DIAGNOSIS — N760 Acute vaginitis: Secondary | ICD-10-CM | POA: Insufficient documentation

## 2019-04-16 DIAGNOSIS — Y929 Unspecified place or not applicable: Secondary | ICD-10-CM | POA: Insufficient documentation

## 2019-04-16 DIAGNOSIS — Z91040 Latex allergy status: Secondary | ICD-10-CM | POA: Insufficient documentation

## 2019-04-16 LAB — COMPREHENSIVE METABOLIC PANEL
ALT: 13 U/L (ref 0–44)
AST: 16 U/L (ref 15–41)
Albumin: 3.7 g/dL (ref 3.5–5.0)
Alkaline Phosphatase: 71 U/L (ref 38–126)
Anion gap: 3 — ABNORMAL LOW (ref 5–15)
BUN: 14 mg/dL (ref 6–20)
CO2: 28 mmol/L (ref 22–32)
Calcium: 9 mg/dL (ref 8.9–10.3)
Chloride: 108 mmol/L (ref 98–111)
Creatinine, Ser: 0.8 mg/dL (ref 0.44–1.00)
GFR calc Af Amer: >60 mL/min (ref >60)
GFR calc non Af Amer: >60 mL/min (ref >60)
Glucose, Bld: 85 mg/dL (ref 70–99)
Potassium: 4 mmol/L (ref 3.5–5.1)
Sodium: 139 mmol/L (ref 135–145)
Total Bilirubin: 0.6 mg/dL (ref 0.3–1.2)
Total Protein: 7.3 g/dL (ref 6.5–8.1)

## 2019-04-16 LAB — CBC
HCT: 41.5 % (ref 36.0–46.0)
Hemoglobin: 12.4 g/dL (ref 12.0–15.0)
MCH: 28.5 pg (ref 26.0–34.0)
MCHC: 29.9 g/dL — ABNORMAL LOW (ref 30.0–36.0)
MCV: 95.4 fL (ref 80.0–100.0)
Platelets: 405 10*3/uL — ABNORMAL HIGH (ref 150–400)
RBC: 4.35 MIL/uL (ref 3.87–5.11)
RDW: 13.3 % (ref 11.5–15.5)
WBC: 4.7 10*3/uL (ref 4.0–10.5)
nRBC: 0 % (ref 0.0–0.2)

## 2019-04-16 LAB — URINALYSIS, ROUTINE W REFLEX MICROSCOPIC
Bacteria, UA: NONE SEEN
Bilirubin Urine: NEGATIVE
Glucose, UA: NEGATIVE mg/dL
Ketones, ur: NEGATIVE mg/dL
Leukocytes,Ua: NEGATIVE
Nitrite: NEGATIVE
Protein, ur: NEGATIVE mg/dL
Specific Gravity, Urine: 1.017 (ref 1.005–1.030)
pH: 6 (ref 5.0–8.0)

## 2019-04-16 LAB — WET PREP, GENITAL
Sperm: NONE SEEN
Trich, Wet Prep: NONE SEEN
WBC, Wet Prep HPF POC: NONE SEEN
Yeast Wet Prep HPF POC: NONE SEEN

## 2019-04-16 LAB — I-STAT BETA HCG BLOOD, ED (MC, WL, AP ONLY): I-stat hCG, quantitative: <5 m[IU]/mL (ref <5)

## 2019-04-16 LAB — LIPASE, BLOOD: Lipase: 48 U/L (ref 11–51)

## 2019-04-16 NOTE — ED Notes (Signed)
Pt not in room, pt belongings gone. Pt eloped.

## 2019-04-16 NOTE — ED Triage Notes (Signed)
Pt reports that she had dark brownish spotting for her last cycle with foul odor and again starting yesterday.

## 2019-04-16 NOTE — ED Provider Notes (Signed)
[place] [facility] DEPT Provider Note   CSN: [id number] Arrival date & time: 04/16/19  1316    History   Chief Complaint Chief Complaint  Patient presents with  . Vaginal Bleeding    HPI Beth Jacobs is a 48 y.o. female with a past medical history of cocaine abuse, who presents today for evaluation of vaginal spotting.  She reports that at the end of her last menstrual cycle she had 2 to 3 days of light brown spotting which resolved.  She states that since yesterday she has had very light brownish spotting.  She says that it is dominant to saturate a pad.  She reports that she is sexually active with the same partner, and does not use protection.    She reports that she only has odor when the bleeding is there.  No other discharge.  She denies any pelvic pain or fevers.  No dysuria, hematuria, increased frequency or urgency.      HPI  Past Medical History:  Diagnosis Date  . Asthma   . Cocaine abuse ([place])   . History of allergic reaction 02/26/2013  . Preterm labor   . Torn Achilles tendon     Patient Active Problem List   Diagnosis Date Noted  . History of allergic reaction 02/26/2013  . Morbid obesity ([place]) 02/26/2013  . Facial cellulitis 02/25/2013  . Anemia 02/25/2013  . AMA (advanced maternal age) multigravida 35+ 10/26/2012  . History of preterm delivery, currently pregnant 10/26/2012  . Drug abuse ([place]) 10/26/2012  . Prenatal care insufficient 10/26/2012    Past Surgical History:  Procedure Laterality Date  . DILATION AND EVACUATION    . NO PAST SURGERIES    . WISDOM TOOTH EXTRACTION       OB History    Gravida  6   Para  4   Term  3   Preterm  1   AB  2   Living  4     SAB  1   TAB  1   Ectopic      Multiple      Live Births  4            Home Medications    Prior to Admission medications   Medication Sig Start Date End Date Taking? Authorizing Provider  albuterol (PROVENTIL HFA;VENTOLIN HFA) 108 (90  Base) MCG/ACT inhaler Inhale 1-2 puffs into the lungs every 6 (six) hours as needed for wheezing or shortness of breath. 04/25/17   Virgel Manifold, MD  citalopram (CELEXA) 20 MG tablet Take 20 mg by mouth daily.    [provider]  hydrOXYzine (ATARAX/VISTARIL) 25 MG tablet Take 1 tablet (25 mg total) by mouth every 6 (six) hours as needed for itching. 06/16/15   Dorie Rank, MD  mirtazapine (REMERON) 15 MG tablet Take 15 mg by mouth at bedtime.    [provider]    Family History Family History  Problem Relation Age of Onset  . Cerebral palsy Son        born at [redacted] weeks gestation  . Cervical cancer Mother   . Other Neg Hx     Social History Social History   Tobacco Use  . Smoking status: Never Smoker  . Smokeless tobacco: Never Used  Substance Use Topics  . Alcohol use: No  . Drug use: Yes    Types: Cocaine     Allergies   Bee venom; Hydrocodone-acetaminophen; Latex; and Tomato   Review of Systems  Review of Systems  Constitutional: Negative for chills and fever.  Respiratory: Negative for chest tightness and shortness of breath.   Cardiovascular: Negative for chest pain.  Gastrointestinal: Negative for abdominal pain, diarrhea, nausea and vomiting.  Genitourinary: Positive for menstrual problem and vaginal bleeding. Negative for dysuria, frequency, pelvic pain, vaginal discharge and vaginal pain.  All other systems reviewed and are negative.    Physical Exam Updated Vital Signs BP (!) 132/106 (BP Location: Left Arm)   Pulse 80   Temp 97.9 F (36.6 C) (Oral)   Resp 17   LMP 04/15/2019   SpO2 100%   Physical Exam Vitals signs and nursing note reviewed. Exam conducted with a chaperone present (Female Tech/RN).  Constitutional:      General: She is not in acute distress.    Appearance: She is well-developed. She is not diaphoretic.     Comments: Patient is constantly moving in bed, unable to sit still.   HENT:     Head: Normocephalic and  atraumatic.  Eyes:     General: No scleral icterus.       Right eye: No discharge.        Left eye: No discharge.     Conjunctiva/sclera: Conjunctivae normal.  Neck:     Musculoskeletal: Normal range of motion.  Cardiovascular:     Rate and Rhythm: Normal rate and regular rhythm.  Pulmonary:     Effort: Pulmonary effort is normal. No respiratory distress.     Breath sounds: No stridor.  Abdominal:     General: There is no distension.  Genitourinary:    Comments: Normal external female genitalia.  Speculum exam performed With a dark brown foreign body visualized.  Speculum was withdrawn, sponge clamps were obtained and on repeat speculum exam used to remove foreign body that appeared to be consistent with a tampon, very foul odor.  According to patient this is been there for approximately 1 month.  No TTP in adnexa b/l.  No CMT.  No discharge from cervix.  Area where tampon was is midly erythematous.   Musculoskeletal:        General: No deformity.  Skin:    General: Skin is warm and dry.  Neurological:     General: No focal deficit present.     Mental Status: She is alert.     Motor: No abnormal muscle tone.  Psychiatric:        Mood and Affect: Mood normal.        Behavior: Behavior normal.      ED Treatments / Results  Labs (all labs ordered are listed, but only abnormal results are displayed) Labs Reviewed  WET PREP, GENITAL - Abnormal; Notable for the following components:      Result Value   Clue Cells Wet Prep HPF POC PRESENT (*)    All other components within normal limits  COMPREHENSIVE METABOLIC PANEL - Abnormal; Notable for the following components:   Anion gap 3 (*)    All other components within normal limits  CBC - Abnormal; Notable for the following components:   MCHC 29.9 (*)    Platelets 405 (*)    All other components within normal limits  URINALYSIS, ROUTINE W REFLEX MICROSCOPIC - Abnormal; Notable for the following components:   Color, Urine STRAW  (*)    Hgb urine dipstick SMALL (*)    All other components within normal limits  LIPASE, BLOOD  I-STAT BETA HCG BLOOD, ED ([facility], [facility], [facility] ONLY)  GC/CHLAMYDIA PROBE  AMP ([place]) NOT AT [facility]    EKG None  Radiology No results found.  Procedures Procedures (including critical care time)  Medications Ordered in ED Medications - No data to display   Initial Impression / Assessment and Plan / ED Course  I have reviewed the triage vital signs and the nursing notes.  Pertinent labs & imaging results that were available during my care of the patient were reviewed by me and considered in my medical decision making (see chart for details).  Clinical Course as of Apr 15 1625  Mon Apr 16, 2019  1534 Went to update patient, she is not in room.    [EH]  1541 Attempted to check on patient to give results again, not in room.    [EH]    Clinical Course User Index [EH] Lorin Glass, PA-C      Patient presents today for evaluation of vaginal bleeding and foul odors.  Labs were obtained by triage which were generally reassuring, he does not have significant leukocytosis or anemia without significant hematologic or electrolyte derangements.  Urine does not appear infected, lipase is not elevated.  Pelvic exam was performed with a retained foreign body visible on speculum exam.  A tampon was removed from the vagina.  Wet prep and GC was sent.  Wet prep positive for clue cells.  Plan to treat with antibiotics given retained foreign body and clue cells.  Patient eloped before discussed return precautions, treatment or additional plans.   Final Clinical Impressions(s) / ED Diagnoses   Final diagnoses:  Retained tampon, initial encounter  Bacterial vaginosis    ED Discharge Orders    None       Ollen Gross 04/16/19 1629    Dorie Rank, MD 04/17/19 [phone]

## 2019-04-17 LAB — GC/CHLAMYDIA PROBE AMP (~~LOC~~) NOT AT ARMC
Chlamydia: NEGATIVE
Neisseria Gonorrhea: NEGATIVE

## 2020-03-10 ENCOUNTER — Emergency Department (HOSPITAL_COMMUNITY)
Admission: EM | Admit: 2020-03-10 | Discharge: 2020-03-10 | Discharge status: Against Medical Advice | Payer: Medicaid Other

## 2020-03-10 ENCOUNTER — Other Ambulatory Visit: Payer: Self-pay

## 2020-03-10 NOTE — ED Notes (Signed)
Patient called 3x for room call with no answer.

## 2020-05-23 ENCOUNTER — Emergency Department (HOSPITAL_COMMUNITY)
Admission: EM | Admit: 2020-05-23 | Discharge: 2020-05-23 | Disposition: A | Discharge status: Home / Self Care | Payer: Medicaid Other | Attending: Emergency Medicine | Admitting: Emergency Medicine

## 2020-05-23 ENCOUNTER — Other Ambulatory Visit: Payer: Self-pay

## 2020-05-23 DIAGNOSIS — Z91040 Latex allergy status: Secondary | ICD-10-CM | POA: Insufficient documentation

## 2020-05-23 DIAGNOSIS — Z7951 Long term (current) use of inhaled steroids: Secondary | ICD-10-CM | POA: Insufficient documentation

## 2020-05-23 DIAGNOSIS — R05 Cough: Secondary | ICD-10-CM | POA: Insufficient documentation

## 2020-05-23 DIAGNOSIS — J069 Acute upper respiratory infection, unspecified: Secondary | ICD-10-CM | POA: Insufficient documentation

## 2020-05-23 DIAGNOSIS — Z20822 Contact with and (suspected) exposure to covid-19: Secondary | ICD-10-CM | POA: Insufficient documentation

## 2020-05-23 DIAGNOSIS — J45909 Unspecified asthma, uncomplicated: Secondary | ICD-10-CM | POA: Insufficient documentation

## 2020-05-23 LAB — SARS CORONAVIRUS 2 BY RT PCR (HOSPITAL ORDER, PERFORMED IN ~~LOC~~ HOSPITAL LAB): SARS Coronavirus 2: NEGATIVE

## 2020-05-23 NOTE — ED Triage Notes (Signed)
Per EMS-states SOB, dry cough, loss of taste for 2 days-tested negative on 6/27, was not having symptoms at that time-constricted pupils, jittery, cant sit still

## 2020-05-23 NOTE — Discharge Instructions (Addendum)
Please read instructions below.  You can take tylenol or ibuprofen as needed for sore throat or fever.  Drink plenty of water.  Use saline nasal spray for congestion. Isolate at home until you know your COVID test results; you will be contacted by the hospital if you have a POSITIVE result. Follow up with your primary care provider as needed.  Return to the ER for inability to swallow liquids, difficulty breathing, or new or worsening symptoms.

## 2020-05-23 NOTE — ED Provider Notes (Signed)
[facility] DEPT Provider Note   CSN: [id number] Arrival date & time: 05/23/20  1051     History Chief Complaint  Patient presents with  . Nasal Congestion    Beth Jacobs is a 49 y.o. female presenting to the ED with complaint of 2 days of sinus pressure and congestion. Assoc loss of smell and taste, dry cough, sneezing. She has tried hot tea and tylenol without much relief. No F/C, CP, SOB, abd complaints, sore throat. Hx COVID illness 7-8 months ago.   The history is provided by the patient.       Past Medical History:  Diagnosis Date  . Asthma   . Cocaine abuse ([place])   . History of allergic reaction 02/26/2013  . Preterm labor   . Torn Achilles tendon     Patient Active Problem List   Diagnosis Date Noted  . History of allergic reaction 02/26/2013  . Morbid obesity ([place]) 02/26/2013  . Facial cellulitis 02/25/2013  . Anemia 02/25/2013  . AMA (advanced maternal age) multigravida 35+ 10/26/2012  . History of preterm delivery, currently pregnant 10/26/2012  . Drug abuse ([place]) 10/26/2012  . Prenatal care insufficient 10/26/2012    Past Surgical History:  Procedure Laterality Date  . DILATION AND EVACUATION    . NO PAST SURGERIES    . WISDOM TOOTH EXTRACTION       OB History    Gravida  6   Para  4   Term  3   Preterm  1   AB  2   Living  4     SAB  1   TAB  1   Ectopic      Multiple      Live Births  4           Family History  Problem Relation Age of Onset  . Cerebral palsy Son        born at [redacted] weeks gestation  . Cervical cancer Mother   . Other Neg Hx     Social History   Tobacco Use  . Smoking status: Never Smoker  . Smokeless tobacco: Never Used  Vaping Use  . Vaping Use: Never used  Substance Use Topics  . Alcohol use: No  . Drug use: Yes    Types: Cocaine    Home Medications Prior to Admission medications   Medication Sig Start Date End Date Taking? Authorizing Provider  albuterol  (PROVENTIL HFA;VENTOLIN HFA) 108 (90 Base) MCG/ACT inhaler Inhale 1-2 puffs into the lungs every 6 (six) hours as needed for wheezing or shortness of breath. 04/25/17   Virgel Manifold, MD  citalopram (CELEXA) 20 MG tablet Take 20 mg by mouth daily.    [provider]  hydrOXYzine (ATARAX/VISTARIL) 25 MG tablet Take 1 tablet (25 mg total) by mouth every 6 (six) hours as needed for itching. 06/16/15   Dorie Rank, MD  mirtazapine (REMERON) 15 MG tablet Take 15 mg by mouth at bedtime.    [provider]    Allergies    Bee venom, Hydrocodone-acetaminophen, Latex, and Tomato  Review of Systems   Review of Systems  Constitutional: Negative for fever.  HENT: Positive for congestion and sinus pressure. Negative for ear pain and sore throat.   Respiratory: Positive for cough. Negative for shortness of breath.     Physical Exam Updated Vital Signs BP 124/83 (BP Location: Right Arm)   Pulse 79   Temp 97.7 F (36.5 C) (Oral)  Resp 18   LMP 05/16/2020   SpO2 99%   Physical Exam Vitals and nursing note reviewed.  Constitutional:      General: She is not in acute distress.    Appearance: She is well-developed.  HENT:     Head: Normocephalic and atraumatic.     Right Ear: Tympanic membrane, ear canal and external ear normal.     Left Ear: Tympanic membrane, ear canal and external ear normal.     Nose: Congestion present.     Right Sinus: No maxillary sinus tenderness.     Left Sinus: No maxillary sinus tenderness.     Mouth/Throat:     Mouth: Mucous membranes are moist.     Pharynx: Oropharynx is clear. Uvula midline. No pharyngeal swelling, oropharyngeal exudate, posterior oropharyngeal erythema or uvula swelling.     Tonsils: No tonsillar exudate.     Comments: Tolerating secretions Eyes:     Conjunctiva/sclera: Conjunctivae normal.  Cardiovascular:     Rate and Rhythm: Normal rate and regular rhythm.  Pulmonary:     Effort: Pulmonary effort is normal. No respiratory  distress.     Breath sounds: Normal breath sounds.  Musculoskeletal:     Cervical back: Normal range of motion and neck supple. No tenderness.  Lymphadenopathy:     Cervical: No cervical adenopathy.  Neurological:     Mental Status: She is alert.  Psychiatric:        Mood and Affect: Mood normal.        Behavior: Behavior normal.     ED Results / Procedures / Treatments   Labs (all labs ordered are listed, but only abnormal results are displayed) Labs Reviewed  SARS CORONAVIRUS 2 BY RT PCR (HOSPITAL ORDER, [facility])    EKG None  Radiology No results found.  Procedures Procedures (including critical care time)  Medications Ordered in ED Medications - No data to display  ED Course  I have reviewed the triage vital signs and the nursing notes.  Pertinent labs & imaging results that were available during my care of the patient were reviewed by me and considered in my medical decision making (see chart for details).    MDM Rules/Calculators/A&P                         Beth Jacobs was evaluated in Emergency Department on 05/23/2020 for the symptoms described in the history of present illness. She was evaluated in the context of the global COVID-19 pandemic, which necessitated consideration that the patient might be at risk for infection with the SARS-CoV-2 virus that causes COVID-19. Institutional protocols and algorithms that pertain to the evaluation of patients at risk for COVID-19 are in a state of rapid change based on information released by regulatory bodies including the CDC and federal and state organizations. These policies and algorithms were followed during the patient's care in the ED.  Pt presenting w 2 days of URI sx, including dry cough, congestion, loss of taste and smell. No SOB. Heart and lung exam is normal. ENT exam reassuring. VS are normal, afebrile. COVID test sent. Likely viral URI vs COVID-19. Symptomatic treatment, home  isolation precautions discussed. Safe for discharge.  Discussed results, findings, treatment and follow up. Patient advised of return precautions. Patient verbalized understanding and agreed with plan.  Final Clinical Impression(s) / ED Diagnoses Final diagnoses:  Viral URI with cough    Rx / DC Orders ED Discharge  Orders    None       Yanique Mulvihill, [place] N, PA-C 05/23/20 1136    Truddie Hidden, MD 05/23/20 [phone]

## 2020-05-23 NOTE — ED Triage Notes (Signed)
Per pt, states she is having nasal congestion and sinus pressure-loss of taste occurred after congestion, post nasal drip-cant breathe due to nasal congestion, mask

## 2021-05-03 ENCOUNTER — Encounter (HOSPITAL_COMMUNITY): Payer: Self-pay | Admitting: *Deleted

## 2021-05-03 ENCOUNTER — Other Ambulatory Visit: Payer: Self-pay

## 2021-05-03 ENCOUNTER — Ambulatory Visit (HOSPITAL_COMMUNITY)
Admission: EM | Admit: 2021-05-03 | Discharge: 2021-05-03 | Disposition: A | Discharge status: Home / Self Care | Payer: Medicaid Other | Attending: Emergency Medicine | Admitting: Emergency Medicine

## 2021-05-03 DIAGNOSIS — R1030 Lower abdominal pain, unspecified: Secondary | ICD-10-CM

## 2021-05-03 LAB — POCT URINALYSIS DIPSTICK, ED / UC
Bilirubin Urine: NEGATIVE
Glucose, UA: NEGATIVE mg/dL
Hgb urine dipstick: NEGATIVE
Ketones, ur: NEGATIVE mg/dL
Leukocytes,Ua: NEGATIVE
Nitrite: NEGATIVE
Protein, ur: NEGATIVE mg/dL
Specific Gravity, Urine: 1.02 (ref 1.005–1.030)
Urobilinogen, UA: 0.2 mg/dL (ref 0.0–1.0)
pH: 7.5 (ref 5.0–8.0)

## 2021-05-03 LAB — POC URINE PREG, ED: Preg Test, Ur: NEGATIVE

## 2021-05-03 NOTE — ED Provider Notes (Signed)
[place]    CSN: [id number] Arrival date & time: 05/03/21  1310      History   Chief Complaint Chief Complaint  Patient presents with   Abdominal Pain    HPI Beth Jacobs is a 50 y.o. female.   Patient here for evaluation of lower abdominal pain and cramping that has been ongoing for the past several days.  Patient reports pain similar to ovulation pain in the past.  Has not taken any OTC medications or treatments.  Denies any trauma, injury, or other precipitating event.  Denies any specific alleviating or aggravating factors.  Denies any fevers, chest pain, shortness of breath, N/V/D, numbness, tingling, weakness, or headaches.    The history is provided by the patient.  Abdominal Pain Associated symptoms: no diarrhea, no dysuria, no nausea and no vomiting    Past Medical History:  Diagnosis Date   Asthma    Cocaine abuse ([place])    History of allergic reaction 02/26/2013   Preterm labor    Torn Achilles tendon     Patient Active Problem List   Diagnosis Date Noted   History of allergic reaction 02/26/2013   Morbid obesity ([place]) 02/26/2013   Facial cellulitis 02/25/2013   Anemia 02/25/2013   AMA (advanced maternal age) multigravida 35+ 10/26/2012   History of preterm delivery, currently pregnant 10/26/2012   Drug abuse ([place]) 10/26/2012   Prenatal care insufficient 10/26/2012    Past Surgical History:  Procedure Laterality Date   DILATION AND EVACUATION     NO PAST SURGERIES     WISDOM TOOTH EXTRACTION      OB History     Gravida  6   Para  4   Term  3   Preterm  1   AB  2   Living  4      SAB  1   IAB  1   Ectopic      Multiple      Live Births  4            Home Medications    Prior to Admission medications   Medication Sig Start Date End Date Taking? Authorizing Provider  albuterol (PROVENTIL HFA;VENTOLIN HFA) 108 (90 Base) MCG/ACT inhaler Inhale 1-2 puffs into the lungs every 6 (six) hours as needed for wheezing  or shortness of breath. 04/25/17   Virgel Manifold, MD  citalopram (CELEXA) 20 MG tablet Take 20 mg by mouth daily.    [provider]  hydrOXYzine (ATARAX/VISTARIL) 25 MG tablet Take 1 tablet (25 mg total) by mouth every 6 (six) hours as needed for itching. 06/16/15   Dorie Rank, MD  mirtazapine (REMERON) 15 MG tablet Take 15 mg by mouth at bedtime.    [provider]    Family History Family History  Problem Relation Age of Onset   Cerebral palsy Son        born at [redacted] weeks gestation   Cervical cancer Mother    Other Neg Hx     Social History Social History   Tobacco Use   Smoking status: Never   Smokeless tobacco: Never  Vaping Use   Vaping Use: Never used  Substance Use Topics   Alcohol use: No   Drug use: Yes    Types: Cocaine     Allergies   Bee venom, Hydrocodone-acetaminophen, Latex, and Tomato   Review of Systems Review of Systems  Gastrointestinal:  Positive for abdominal pain. Negative for diarrhea, nausea and vomiting.  Genitourinary:  Negative for dysuria and urgency.  All other systems reviewed and are negative.   Physical Exam Triage Vital Signs ED Triage Vitals  Enc Vitals Group     BP 05/03/21 1347 (!) 135/91     Pulse Rate 05/03/21 1347 92     Resp 05/03/21 1347 18     Temp 05/03/21 1347 97.8 F (36.6 C)     Temp src --      SpO2 05/03/21 1347 97 %     Weight --      Height --      Head Circumference --      Peak Flow --      Pain Score 05/03/21 1345 4     Pain Loc --      Pain Edu? --      Excl. in [place]? --    No data found.  Updated Vital Signs BP (!) 135/91   Pulse 92   Temp 97.8 F (36.6 C)   Resp 18   LMP 04/25/2021   SpO2 97%   Visual Acuity Right Eye Distance:   Left Eye Distance:   Bilateral Distance:    Right Eye Near:   Left Eye Near:    Bilateral Near:     Physical Exam Vitals and nursing note reviewed.  Constitutional:      General: She is not in acute distress.    Appearance: Normal  appearance. She is not ill-appearing, toxic-appearing or diaphoretic.  HENT:     Head: Normocephalic and atraumatic.  Eyes:     Conjunctiva/sclera: Conjunctivae normal.  Cardiovascular:     Rate and Rhythm: Normal rate.     Pulses: Normal pulses.  Pulmonary:     Effort: Pulmonary effort is normal.  Abdominal:     General: Abdomen is flat.     Palpations: Abdomen is soft.     Tenderness: There is abdominal tenderness in the suprapubic area. There is no right CVA tenderness, left CVA tenderness, guarding or rebound. Negative signs include Murphy's sign, Rovsing's sign, McBurney's sign, psoas sign and obturator sign.  Musculoskeletal:        General: Normal range of motion.     Cervical back: Normal range of motion.  Skin:    General: Skin is warm and dry.  Neurological:     General: No focal deficit present.     Mental Status: She is alert and oriented to person, place, and time.  Psychiatric:        Mood and Affect: Mood normal.     UC Treatments / Results  Labs (all labs ordered are listed, but only abnormal results are displayed) Labs Reviewed  POCT URINALYSIS DIPSTICK, ED / UC  POC URINE PREG, ED    EKG   Radiology No results found.  Procedures Procedures (including critical care time)  Medications Ordered in UC Medications - No data to display  Initial Impression / Assessment and Plan / UC Course  I have reviewed the triage vital signs and the nursing notes.  Pertinent labs & imaging results that were available during my care of the patient were reviewed by me and considered in my medical decision making (see chart for details).    Assessment negative for red flags or concerns.  Urine pregnancy negative and urinalysis negative for any signs of infection.  May take ibuprofen and/or Tylenol as needed for pain and fevers.  Recommend warm compress for comfort.  Follow-up with primary care as needed. Final Clinical Impressions(s) /  UC Diagnoses   Final diagnoses:   Lower abdominal pain     Discharge Instructions      Take ibuprofen and/or Tylenol as needed for pain relief and fever reduction.  Apply a warm compress to help with pain.  Return or go to the Emergency Department if symptoms worsen or do not improve in the next few days.      ED Prescriptions   None    PDMP not reviewed this encounter.   Pearson Forster, NP 05/03/21 1441

## 2021-05-03 NOTE — Discharge Instructions (Addendum)
Take ibuprofen and/or Tylenol as needed for pain relief and fever reduction.  Apply a warm compress to help with pain.  Return or go to the Emergency Department if symptoms worsen or do not improve in the next few days.

## 2021-05-03 NOTE — ED Triage Notes (Signed)
Pt reports ABD pain for several days.

## 2021-06-03 ENCOUNTER — Emergency Department (HOSPITAL_COMMUNITY)
Admission: EM | Admit: 2021-06-03 | Discharge: 2021-06-03 | Disposition: A | Discharge status: Home / Self Care | Payer: Self-pay | Attending: Emergency Medicine | Admitting: Emergency Medicine

## 2021-06-03 ENCOUNTER — Emergency Department (HOSPITAL_COMMUNITY): Payer: Self-pay

## 2021-06-03 ENCOUNTER — Other Ambulatory Visit: Payer: Self-pay

## 2021-06-03 DIAGNOSIS — J45909 Unspecified asthma, uncomplicated: Secondary | ICD-10-CM | POA: Insufficient documentation

## 2021-06-03 DIAGNOSIS — Z5321 Procedure and treatment not carried out due to patient leaving prior to being seen by health care provider: Secondary | ICD-10-CM | POA: Insufficient documentation

## 2021-06-03 MED ORDER — ALBUTEROL SULFATE HFA 108 (90 BASE) MCG/ACT IN AERS
2.0000 | INHALATION_SPRAY | RESPIRATORY_TRACT | Status: DC | PRN
  Administered 2021-06-03: 2 via RESPIRATORY_TRACT

## 2021-06-03 MED ORDER — ALBUTEROL SULFATE HFA 108 (90 BASE) MCG/ACT IN AERS
INHALATION_SPRAY | RESPIRATORY_TRACT | Status: AC
  Filled 2021-06-03: qty 6.7

## 2021-06-03 NOTE — ED Notes (Signed)
Patient transported to X-ray 

## 2021-06-03 NOTE — ED Notes (Signed)
Called for vitals no response

## 2021-06-03 NOTE — ED Triage Notes (Signed)
Pt reports hx of asthma and feeling of asthma attack that started two hours ago. Pt anxious during triage. States that she has not had any rescue inhaler or nebulizer treatments PTA.

## 2021-06-03 NOTE — ED Notes (Signed)
Called for vitals and no response

## 2021-06-03 NOTE — ED Notes (Signed)
Called 2x for vitals

## 2021-06-29 ENCOUNTER — Other Ambulatory Visit: Payer: Self-pay

## 2021-06-29 ENCOUNTER — Ambulatory Visit: Payer: Self-pay | Admitting: Physician Assistant

## 2021-06-29 VITALS — BP 141/93 | HR 77 | Temp 97.9°F | Resp 16 | Ht 61.0 in | Wt 146.0 lb

## 2021-06-29 DIAGNOSIS — F1410 Cocaine abuse, uncomplicated: Secondary | ICD-10-CM

## 2021-06-29 DIAGNOSIS — F411 Generalized anxiety disorder: Secondary | ICD-10-CM

## 2021-06-29 DIAGNOSIS — M5442 Lumbago with sciatica, left side: Secondary | ICD-10-CM

## 2021-06-29 DIAGNOSIS — M5441 Lumbago with sciatica, right side: Secondary | ICD-10-CM

## 2021-06-29 DIAGNOSIS — G8929 Other chronic pain: Secondary | ICD-10-CM

## 2021-06-29 DIAGNOSIS — F4310 Post-traumatic stress disorder, unspecified: Secondary | ICD-10-CM

## 2021-06-29 DIAGNOSIS — F3181 Bipolar II disorder: Secondary | ICD-10-CM

## 2021-06-29 MED ORDER — CYCLOBENZAPRINE HCL 5 MG PO TABS
5.0000 mg | ORAL_TABLET | Freq: Three times a day (TID) | ORAL | 0 refills | Status: DC | PRN
  Filled 2021-06-29: qty 30, 10d supply, fill #0

## 2021-06-29 MED ORDER — CITALOPRAM HYDROBROMIDE 20 MG PO TABS
20.0000 mg | ORAL_TABLET | Freq: Every day | ORAL | 1 refills | Status: DC
  Filled 2021-06-29: qty 30, 30d supply, fill #0

## 2021-06-29 MED ORDER — HYDROXYZINE HCL 25 MG PO TABS
25.0000 mg | ORAL_TABLET | Freq: Four times a day (QID) | ORAL | 1 refills | Status: DC | PRN
  Filled 2021-06-29: qty 60, 15d supply, fill #0

## 2021-06-29 MED ORDER — MIRTAZAPINE 15 MG PO TABS
15.0000 mg | ORAL_TABLET | Freq: Every day | ORAL | 1 refills | Status: DC
  Filled 2021-06-29: qty 30, 30d supply, fill #0
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
## 2021-06-29 NOTE — Progress Notes (Signed)
Medical clearance/ medication refill

## 2021-06-29 NOTE — Patient Instructions (Signed)
[facility]  Please make sure that you sign up to be seen in 1 week at [facility] when the mobile unit is there.  Please be prepared to have fasting labs completed.  Kennieth Rad, PA-C Physician Assistant [email or web address]   Managing Stress, Adult Feeling a certain amount of stress is normal. Stress helps our body and mind get ready to deal with the demands of life. Stress hormones can motivate you to do well at work and meet your responsibilities. However severe or long-lasting (chronic) stress can affect your mental and physical health. Chronic stress puts you at higher risk for anxiety, depression, and other health problems like digestiveproblems, muscle aches, heart disease, high blood pressure, and stroke. What are the causes? Common causes of stress include: Demands from work, such as deadlines, feeling overworked, or having long hours. Pressures at home, such as money issues, disagreements with a spouse, or parenting issues. Pressures from major life changes, such as divorce, moving, loss of a loved one, or chronic illness. You may be at higher risk for stress-related problems if you do not get enough sleep, are in poor health, do not have emotional support, or have a mentalhealth disorder like anxiety or depression. How to recognize stress Stress can make you: Have trouble sleeping. Feel sad, anxious, irritable, or overwhelmed. Lose your appetite. Overeat or want to eat unhealthy foods. Want to use drugs or alcohol. Stress can also cause physical symptoms, such as: Sore, tense muscles, especially in the shoulders and neck. Headaches. Trouble breathing. A faster heart rate. Stomach pain, nausea, or vomiting. Diarrhea or constipation. Trouble concentrating. Follow these instructions at home: Lifestyle Identify the source of your  stress and your reaction to it. See a therapist who can help you change your reactions. When there are stressful events: Talk about it with family, friends, or co-workers. Try to think realistically about stressful events and not ignore them or overreact. Try to find the positives in a stressful situation and not focus on the negatives. Cut back on responsibilities at work and home, if possible. Ask for help from friends or family members if you need it. Find ways to cope with stress, such as: Meditation. Deep breathing. Yoga or tai chi. Progressive muscle relaxation. Doing art, playing music, or reading. Making time for fun activities. Spending time with family and friends. Get support from family, friends, or spiritual resources. Eating and drinking Eat a healthy diet. This includes: Eating foods that are high in fiber, such as beans, whole grains, and fresh fruits and vegetables. Limiting foods that are high in fat and processed sugars, such as fried and sweet foods. Do not skip meals or overeat. Drink enough fluid to keep your urine pale yellow. Alcohol use Do not drink alcohol if: Your health care provider tells you not to drink. You are pregnant, may be pregnant, or are planning to become pregnant. Drinking alcohol is a way some people try to ease their stress. This can be dangerous, so if you drink alcohol: Limit how much you use to: 0-1 drink a day for women. 0-2 drinks a day for men. Be aware of how much alcohol is in your drink. In the [place] one drink equals one 12 oz bottle of beer (355 mL), one 5 oz glass of wine (148 mL), or one 1 oz glass of hard liquor (44 mL). Activity  Include 30 minutes of exercise in your daily  schedule. Exercise is a good stress reducer. Include time in your day for an activity that you find relaxing. Try taking a walk, going on a bike ride, reading a book, or listening to music. Schedule your time in a way that lowers stress, and keep a  consistent schedule. Prioritize what is most important to get done.  General instructions Get enough sleep. Try to go to sleep and get up at about the same time every day. Take over-the-counter and prescription medicines only as told by your health care provider. Do not use any products that contain nicotine or tobacco, such as cigarettes, e-cigarettes, and chewing tobacco. If you need help quitting, ask your health care provider. Do not use drugs or smoke to cope with stress. Keep all follow-up visits as told by your health care provider. This is important. Where to find support Talk with your health care provider about stress management or finding a support group. Find a therapist to work with you on your stress management techniques. Contact a health care provider if: Your stress symptoms get worse. You are unable to manage your stress at home. You are struggling to stop using drugs or alcohol. Get help right away if: You may be a danger to yourself or others. You have any thoughts of death or suicide. If you ever feel like you may hurt yourself or others, or have thoughts about taking your own life, get help right away. You can go to your nearest emergency department or call: Your local emergency services (911 in the [place] A suicide crisis helpline, such as the [facility] at (442) 227-7409. This is open 24 hours a day. Summary Feeling a certain amount of stress is normal, but severe or long-lasting (chronic) stress can affect your mental and physical health. Chronic stress can put you at higher risk for anxiety, depression, and other health problems like digestive problems, muscle aches, heart disease, high blood pressure, and stroke. You may be at higher risk for stress-related problems if you do not get enough sleep, are in poor health, lack emotional support, or have a mental health disorder like anxiety or depression. Identify the source of your stress and  your reaction to it. Try talking about stressful events with family, friends, or co-workers, finding a coping method, or getting support from spiritual resources. If you need more help, talk with your health care provider about finding a support group or a mental health therapist. This information is not intended to replace advice given to you by your health care provider. Make sure you discuss any questions you have with your healthcare provider. Document Revised: 06/06/2019 Document Reviewed: 06/06/2019 Elsevier Patient Education  [place].

## 2022-01-04 ENCOUNTER — Telehealth: Payer: Self-pay | Admitting: *Deleted

## 2022-01-04 NOTE — Telephone Encounter (Signed)
Pt states she saw [facility] and is in [place] currently but is   1) struggling w/ sleep due to not having her pills and  2) Need medication refills for   Rx #: [id number]  citalopram (CELEXA) 20 MG tablet [[id number]]  Rx #: [id number]   cyclobenzaprine (FLEXERIL) 5 MG tablet [[id number]]   #: [id number]  hydrOXYzine (ATARAX/VISTARIL) 25 MG tablet [[id number]]   Rx #: [id number]  mirtazapine (REMERON) 15 MG tablet [[id number]]   albuterol (PROVENTIL HFA;VENTOLIN HFA) 108 (90 Base) MCG/ACT inhaler [address], [place] [address]  Phone:  --  DEA #:  [id number]

## 2022-01-04 NOTE — Telephone Encounter (Signed)
Patients visit was 07/13/21. Patient will need to see a provider where she is located for refills.

## 2022-01-09 ENCOUNTER — Telehealth: Payer: Self-pay | Admitting: *Deleted

## 2022-01-09 NOTE — Telephone Encounter (Signed)
Please call regarding medication refills.  Did not state name of meds needed.

## 2022-01-11 NOTE — Telephone Encounter (Signed)
Patient was contacted on 2/13 regarding concern. Patient requires an appointment for refills. Last visit was 07/13/21 and patient is out of state at this time.

## 2022-02-18 ENCOUNTER — Ambulatory Visit (INDEPENDENT_AMBULATORY_CARE_PROVIDER_SITE_OTHER): Payer: 59 | Admitting: Physician Assistant

## 2022-02-18 ENCOUNTER — Encounter: Payer: Self-pay | Admitting: Physician Assistant

## 2022-02-18 VITALS — BP 116/76 | HR 96 | Temp 98.2°F | Resp 18 | Ht 61.0 in | Wt 170.0 lb

## 2022-02-18 DIAGNOSIS — F4310 Post-traumatic stress disorder, unspecified: Secondary | ICD-10-CM | POA: Diagnosis not present

## 2022-02-18 DIAGNOSIS — F3181 Bipolar II disorder: Secondary | ICD-10-CM

## 2022-02-18 DIAGNOSIS — G8929 Other chronic pain: Secondary | ICD-10-CM

## 2022-02-18 DIAGNOSIS — M5442 Lumbago with sciatica, left side: Secondary | ICD-10-CM

## 2022-02-18 DIAGNOSIS — F1410 Cocaine abuse, uncomplicated: Secondary | ICD-10-CM

## 2022-02-18 DIAGNOSIS — F411 Generalized anxiety disorder: Secondary | ICD-10-CM | POA: Diagnosis not present

## 2022-02-18 DIAGNOSIS — E559 Vitamin D deficiency, unspecified: Secondary | ICD-10-CM

## 2022-02-18 DIAGNOSIS — Z6832 Body mass index (BMI) 32.0-32.9, adult: Secondary | ICD-10-CM

## 2022-02-18 DIAGNOSIS — Z1159 Encounter for screening for other viral diseases: Secondary | ICD-10-CM

## 2022-02-18 DIAGNOSIS — Z9189 Other specified personal risk factors, not elsewhere classified: Secondary | ICD-10-CM

## 2022-02-18 DIAGNOSIS — E6609 Other obesity due to excess calories: Secondary | ICD-10-CM

## 2022-02-18 DIAGNOSIS — M5441 Lumbago with sciatica, right side: Secondary | ICD-10-CM

## 2022-02-18 MED ORDER — CITALOPRAM HYDROBROMIDE 20 MG PO TABS: 20.0000 mg | ORAL_TABLET | Freq: Every day | ORAL | 1 refills | Status: DC

## 2022-02-18 MED ORDER — LAMOTRIGINE 25 MG PO TABS: 50.0000 mg | ORAL_TABLET | Freq: Every day | ORAL | 1 refills | Status: AC

## 2022-02-18 MED ORDER — HYDROXYZINE HCL 50 MG PO TABS: ORAL_TABLET | ORAL | 1 refills | Status: DC

## 2022-02-18 NOTE — Progress Notes (Signed)
Patient has eaten and take medication today. ?Patient would like to discuss abilify causing extreme fidgetiness, Patient would like to take celexa at bedtime instead of in the morning due to drowsiness while in group ?Patient denies pain at this time. ?

## 2022-02-18 NOTE — Patient Instructions (Signed)
You are going to stop taking Abilify. ? ?You are going to increase Lamictal 50 mg once daily. ? ?You are going to move Celexa to bedtime. ? ?You can use hydroxyzine 50 mg 1 to 2 tablets every 8 hours as needed to help with anxiety. ? ?We will call you with today's lab results.  You will follow-up with the mobile team in 2 weeks. ? ?Kennieth Rad, PA-C ?Physician Assistant ?[place] ?[email or web address] ? ? ?Health Maintenance, Female ?Adopting a healthy lifestyle and getting preventive care are important in promoting health and wellness. Ask your health care provider about: ?The right schedule for you to have regular tests and exams. ?Things you can do on your own to prevent diseases and keep yourself healthy. ?What should I know about diet, weight, and exercise? ?Eat a healthy diet ? ?Eat a diet that includes plenty of vegetables, fruits, low-fat dairy products, and lean protein. ?Do not eat a lot of foods that are high in solid fats, added sugars, or sodium. ?Maintain a healthy weight ?Body mass index (BMI) is used to identify weight problems. It estimates body fat based on height and weight. Your health care provider can help determine your BMI and help you achieve or maintain a healthy weight. ?Get regular exercise ?Get regular exercise. This is one of the most important things you can do for your health. Most adults should: ?Exercise for at least 150 minutes each week. The exercise should increase your heart rate and make you sweat (moderate-intensity exercise). ?Do strengthening exercises at least twice a week. This is in addition to the moderate-intensity exercise. ?Spend less time sitting. Even light physical activity can be beneficial. ?Watch cholesterol and blood lipids ?Have your blood tested for lipids and cholesterol at 50 years of age, then have this test every 5 years. ?Have your cholesterol levels checked more often if: ?Your lipid or  cholesterol levels are high. ?You are older than 50 years of age. ?You are at high risk for heart disease. ?What should I know about cancer screening? ?Depending on your health history and family history, you may need to have cancer screening at various ages. This may include screening for: ?Breast cancer. ?Cervical cancer. ?Colorectal cancer. ?Skin cancer. ?Lung cancer. ?What should I know about heart disease, diabetes, and high blood pressure? ?Blood pressure and heart disease ?High blood pressure causes heart disease and increases the risk of stroke. This is more likely to develop in people who have high blood pressure readings or are overweight. ?Have your blood pressure checked: ?Every 3-5 years if you are 71-73 years of age. ?Every year if you are 31 years old or older. ?Diabetes ?Have regular diabetes screenings. This checks your fasting blood sugar level. Have the screening done: ?Once every three years after age 60 if you are at a normal weight and have a low risk for diabetes. ?More often and at a younger age if you are overweight or have a high risk for diabetes. ?What should I know about preventing infection? ?Hepatitis B ?If you have a higher risk for hepatitis B, you should be screened for this virus. Talk with your health care provider to find out if you are at risk for hepatitis B infection. ?Hepatitis C ?Testing is recommended for: ?Everyone born from [birth year] through 1964. ?Anyone with known risk factors for hepatitis C. ?Sexually transmitted infections (STIs) ?Get screened for STIs, including gonorrhea and chlamydia, if: ?You are sexually active and are younger than 50 years of age. ?You  are older than 50 years of age and your health care provider tells you that you are at risk for this type of infection. ?Your sexual activity has changed since you were last screened, and you are at increased risk for chlamydia or gonorrhea. Ask your health care provider if you are at risk. ?Ask your health care  provider about whether you are at high risk for HIV. Your health care provider may recommend a prescription medicine to help prevent HIV infection. If you choose to take medicine to prevent HIV, you should first get tested for HIV. You should then be tested every 3 months for as long as you are taking the medicine. ?Pregnancy ?If you are about to stop having your period (premenopausal) and you may become pregnant, seek counseling before you get pregnant. ?Take 400 to 800 micrograms (mcg) of folic acid every day if you become pregnant. ?Ask for birth control (contraception) if you want to prevent pregnancy. ?Osteoporosis and menopause ?Osteoporosis is a disease in which the bones lose minerals and strength with aging. This can result in bone fractures. If you are 20 years old or older, or if you are at risk for osteoporosis and fractures, ask your health care provider if you should: ?Be screened for bone loss. ?Take a calcium or vitamin D supplement to lower your risk of fractures. ?Be given hormone replacement therapy (HRT) to treat symptoms of menopause. ?Follow these instructions at home: ?Alcohol use ?Do not drink alcohol if: ?Your health care provider tells you not to drink. ?You are pregnant, may be pregnant, or are planning to become pregnant. ?If you drink alcohol: ?Limit how much you have to: ?0-1 drink a day. ?Know how much alcohol is in your drink. In the [place] one drink equals one 12 oz bottle of beer (355 mL), one 5 oz glass of wine (148 mL), or one 1? oz glass of hard liquor (44 mL). ?Lifestyle ?Do not use any products that contain nicotine or tobacco. These products include cigarettes, chewing tobacco, and vaping devices, such as e-cigarettes. If you need help quitting, ask your health care provider. ?Do not use street drugs. ?Do not share needles. ?Ask your health care provider for help if you need support or information about quitting drugs. ?General instructions ?Schedule regular health, dental, and  eye exams. ?Stay current with your vaccines. ?Tell your health care provider if: ?You often feel depressed. ?You have ever been abused or do not feel safe at home. ?Summary ?Adopting a healthy lifestyle and getting preventive care are important in promoting health and wellness. ?Follow your health care provider's instructions about healthy diet, exercising, and getting tested or screened for diseases. ?Follow your health care provider's instructions on monitoring your cholesterol and blood pressure. ?This information is not intended to replace advice given to you by your health care provider. Make sure you discuss any questions you have with your health care provider. ?Document Revised: 03/30/2021 Document Reviewed: 03/30/2021 ?Elsevier Patient Education ? [place]. ? ?

## 2022-02-18 NOTE — Progress Notes (Signed)
? ?Established Patient Office Visit ? ?Subjective:  ?Patient ID: Beth Jacobs, female    DOB: 1971-01-26  Age: 51 y.o. MRN: [id number] ? ?CC:  ?Chief Complaint  ?Patient presents with  ? Medication Management  ? ? ?HPI ?Beth Jacobs states that she is currently being treated for substance abuse at [facility] residential treatment center.  States that she arrived a days ago.  States that she does plan on moving with her family to [facility] after she completes her treatment. ? ?States that she was restarted on Abilify and Lamictal.  States that she had previously taken Abilify and had symptoms of fidgety, teeth grinding.  States that these did not resolve previously.  States that the symptoms have restarted since restarting the Abilify. ? ?States that she has been taking Celexa on a daily basis, states that this medication does make her drowsy and would prefer to take it at bedtime. ? ?States that she has been continuing to have low back pain, this is a chronic condition, states that she has not been taking anything for relief, states that she did not realize she was able to use over-the-counter medications available to her at [facility].  States that it hurts worse during the day from doing a lot of sitting.  Denies any new trauma or injury, denies radiation. ? ? ?Past Medical History:  ?Diagnosis Date  ? Asthma   ? Cocaine abuse ([place])   ? History of allergic reaction 02/26/2013  ? Preterm labor   ? Torn Achilles tendon   ? ? ?Past Surgical History:  ?Procedure Laterality Date  ? DILATION AND EVACUATION    ? NO PAST SURGERIES    ? WISDOM TOOTH EXTRACTION    ? ? ?Family History  ?Problem Relation Age of Onset  ? Cerebral palsy Son   ?     born at [redacted] weeks gestation  ? Cervical cancer Mother   ? Other Neg Hx   ? ? ?Social History  ? ?Socioeconomic History  ? Marital status: Legally Separated  ?  Spouse name: Not on file  ? Number of children: Not on file  ? Years of education: Not on file  ? Highest education  level: Not on file  ?Occupational History  ? Not on file  ?Tobacco Use  ? Smoking status: Never  ? Smokeless tobacco: Never  ?Vaping Use  ? Vaping Use: Never used  ?Substance and Sexual Activity  ? Alcohol use: No  ? Drug use: Yes  ?  Types: Cocaine  ? Sexual activity: Yes  ?  Partners: Male  ?  Birth control/protection: None  ?Other Topics Concern  ? Not on file  ?Social History Narrative  ? Not on file  ? ?Social Determinants of Health  ? ?Financial Resource Strain: Not on file  ?Food Insecurity: Not on file  ?Transportation Needs: Not on file  ?Physical Activity: Not on file  ?Stress: Not on file  ?Social Connections: Not on file  ?Intimate Partner Violence: Not on file  ? ? ?Outpatient Medications Prior to Visit  ?Medication Sig Dispense Refill  ? albuterol (PROVENTIL HFA;VENTOLIN HFA) 108 (90 Base) MCG/ACT inhaler Inhale 1-2 puffs into the lungs every 6 (six) hours as needed for wheezing or shortness of breath. 1 Inhaler 0  ? ARIPiprazole (ABILIFY) 5 MG tablet Take 5 mg by mouth daily.    ? citalopram (CELEXA) 20 MG tablet Take 1 tablet (20 mg total) by mouth daily. 30 tablet 1  ? cyclobenzaprine (  FLEXERIL) 5 MG tablet Take 1 tablet (5 mg total) by mouth 3 (three) times daily as needed for muscle spasms. 30 tablet 0  ? hydrOXYzine (ATARAX/VISTARIL) 25 MG tablet Take 1 tablet (25 mg total) by mouth every 6 (six) hours as needed for itching. (Patient taking differently: Take 50 mg by mouth 3 (three) times daily.) 60 tablet 1  ? lamoTRIgine (LAMICTAL) 25 MG tablet Take 25 mg by mouth daily. For 14 days    ? mirtazapine (REMERON) 15 MG tablet Take 1 tablet (15 mg total) by mouth at bedtime. 30 tablet 1  ? ?No facility-administered medications prior to visit.  ? ? ?Allergies  ?Allergen Reactions  ? Bee Venom Anaphylaxis  ?  "Affects oxygen level"  ? Hydrocodone-Acetaminophen Nausea And Vomiting  ?  Patient can take tylenol products and oxycodone , its only hydrocodone that causes issues  ? Latex Hives  ? Tomato  Swelling  ? ? ?ROS ?Review of Systems  ?Constitutional: Negative.   ?HENT: Negative.    ?Eyes: Negative.   ?Respiratory:  Negative for shortness of breath.   ?Cardiovascular:  Negative for chest pain.  ?Gastrointestinal: Negative.   ?Endocrine: Negative.   ?Genitourinary: Negative.   ?Musculoskeletal:  Positive for back pain.  ?Skin: Negative.   ?Allergic/Immunologic: Negative.   ?Neurological: Negative.   ?Hematological: Negative.   ?Psychiatric/Behavioral:  Negative for dysphoric mood, self-injury, sleep disturbance and suicidal ideas. The patient is not nervous/anxious.   ? ?  ?Objective:  ?  ?Physical Exam ?Vitals and nursing note reviewed.  ?Constitutional:   ?   Appearance: Normal appearance. She is obese.  ?HENT:  ?   Head: Normocephalic and atraumatic.  ?   Right Ear: External ear normal.  ?   Left Ear: External ear normal.  ?   Nose: Nose normal.  ?   Mouth/Throat:  ?   Mouth: Mucous membranes are moist.  ?   Pharynx: Oropharynx is clear.  ?Eyes:  ?   Extraocular Movements: Extraocular movements intact.  ?   Conjunctiva/sclera: Conjunctivae normal.  ?   Pupils: Pupils are equal, round, and reactive to light.  ?Cardiovascular:  ?   Rate and Rhythm: Normal rate and regular rhythm.  ?   Pulses: Normal pulses.  ?   Heart sounds: Normal heart sounds.  ?Pulmonary:  ?   Effort: Pulmonary effort is normal.  ?   Breath sounds: Normal breath sounds.  ?Musculoskeletal:     ?   General: Normal range of motion.  ?   Cervical back: Normal, normal range of motion and neck supple.  ?   Thoracic back: Normal. No tenderness.  ?   Lumbar back: Normal. No tenderness.  ?Skin: ?   General: Skin is warm and dry.  ?Neurological:  ?   General: No focal deficit present.  ?   Mental Status: She is alert and oriented to person, place, and time.  ?Psychiatric:     ?   Mood and Affect: Mood normal.     ?   Behavior: Behavior normal.     ?   Thought Content: Thought content normal.     ?   Judgment: Judgment normal.  ? ? ?BP 116/76 (BP  Location: Left Arm, Patient Position: Sitting, Cuff Size: Normal)   Pulse 96   Temp 98.2 ?F (36.8 ?C) (Oral)   Resp 18   Ht _0  (1.549 m)   Wt 170 lb (77.1 kg)   LMP 11/10/2021   SpO2 97%  BMI 32.12 kg/m?  ?Wt Readings from Last 3 Encounters:  ?02/18/22 170 lb (77.1 kg)  ?06/29/21 146 lb (66.2 kg)  ?06/03/21 147 lb (66.7 kg)  ? ? ? ?Health Maintenance Due  ?Topic Date Due  ? COVID-19 Vaccine (1) Never done  ? COLONOSCOPY (Pts 45-54yr Insurance coverage will need to be confirmed)  Never done  ? INFLUENZA VACCINE  Never done  ? PAP SMEAR-Modifier  10/03/2021  ? ? ?There are no preventive care reminders to display for this patient. ? ?Lab Results  ?Component Value Date  ? TSH 0.911 02/18/2022  ? ?Lab Results  ?Component Value Date  ? WBC 4.2 02/18/2022  ? HGB 13.3 02/18/2022  ? HCT 41.3 02/18/2022  ? MCV 87 02/18/2022  ? PLT 420 02/18/2022  ? ?Lab Results  ?Component Value Date  ? NA 139 02/18/2022  ? K 4.7 02/18/2022  ? CO2 28 04/16/2019  ? GLUCOSE 100 (H) 02/18/2022  ? BUN 16 02/18/2022  ? CREATININE 0.89 02/18/2022  ? BILITOT 0.9 02/18/2022  ? ALKPHOS 86 02/18/2022  ? AST 19 02/18/2022  ? ALT 13 04/16/2019  ? PROT 8.0 02/18/2022  ? ALBUMIN 4.8 02/18/2022  ? CALCIUM 10.1 02/18/2022  ? ANIONGAP 3 (L) 04/16/2019  ? EGFR 79 02/18/2022  ? ?No results found for: CHOL ?No results found for: HDL ?No results found for: [place]?No results found for: TRIG ?No results found for: CHOLHDL ?No results found for: HGBA1C ? ?  ?Assessment & Plan:  ? ?Problem List Items Addressed This Visit   ? ?  ? Nervous and Auditory  ? Chronic bilateral low back pain with bilateral sciatica  ? Relevant Medications  ? lamoTRIgine (LAMICTAL) 25 MG tablet  ? citalopram (CELEXA) 20 MG tablet  ? hydrOXYzine (ATARAX) 50 MG tablet  ?  ? Other  ? Bipolar 2 disorder ([place]  ? Relevant Medications  ? lamoTRIgine (LAMICTAL) 25 MG tablet  ? citalopram (CELEXA) 20 MG tablet  ? Other Relevant Orders  ? CBC with Differential/Platelet  (Completed)  ? Comp. Metabolic Panel (12) (Completed)  ? TSH (Completed)  ? HCV Ab w Reflex to Quant PCR (Completed)  ? Vitamin D, 25-hydroxy (Completed)  ? Generalized anxiety disorder  ? Relevant Medications  ? citalo

## 2022-02-19 DIAGNOSIS — E559 Vitamin D deficiency, unspecified: Secondary | ICD-10-CM | POA: Insufficient documentation

## 2022-02-19 LAB — COMP. METABOLIC PANEL (12)
AST: 19 IU/L (ref 0–40)
Albumin/Globulin Ratio: 1.5 (ref 1.2–2.2)
Albumin: 4.8 g/dL (ref 3.8–4.8)
Alkaline Phosphatase: 86 IU/L (ref 44–121)
BUN/Creatinine Ratio: 18 (ref 9–23)
BUN: 16 mg/dL (ref 6–24)
Bilirubin Total: 0.9 mg/dL (ref 0.0–1.2)
Calcium: 10.1 mg/dL (ref 8.7–10.2)
Chloride: 98 mmol/L (ref 96–106)
Creatinine, Ser: 0.89 mg/dL (ref 0.57–1.00)
Globulin, Total: 3.2 g/dL (ref 1.5–4.5)
Glucose: 100 mg/dL — ABNORMAL HIGH (ref 70–99)
Potassium: 4.7 mmol/L (ref 3.5–5.2)
Sodium: 139 mmol/L (ref 134–144)
Total Protein: 8 g/dL (ref 6.0–8.5)
eGFR: 79 mL/min/{1.73_m2} (ref >59)

## 2022-02-19 LAB — CBC WITH DIFFERENTIAL/PLATELET
Basophils Absolute: 0 10*3/uL (ref 0.0–0.2)
Basos: 1 %
EOS (ABSOLUTE): 0.1 10*3/uL (ref 0.0–0.4)
Eos: 1 %
Hematocrit: 41.3 % (ref 34.0–46.6)
Hemoglobin: 13.3 g/dL (ref 11.1–15.9)
Immature Grans (Abs): 0 10*3/uL (ref 0.0–0.1)
Immature Granulocytes: 0 %
Lymphocytes Absolute: 1.7 10*3/uL (ref 0.7–3.1)
Lymphs: 39 %
MCH: 28 pg (ref 26.6–33.0)
MCHC: 32.2 g/dL (ref 31.5–35.7)
MCV: 87 fL (ref 79–97)
Monocytes Absolute: 0.4 10*3/uL (ref 0.1–0.9)
Monocytes: 9 %
Neutrophils Absolute: 2.1 10*3/uL (ref 1.4–7.0)
Neutrophils: 50 %
Platelets: 420 10*3/uL (ref 150–450)
RBC: 4.75 x10E6/uL (ref 3.77–5.28)
RDW: 13.2 % (ref 11.7–15.4)
WBC: 4.2 10*3/uL (ref 3.4–10.8)

## 2022-02-19 LAB — HCV AB W REFLEX TO QUANT PCR: HCV Ab: NONREACTIVE

## 2022-02-19 LAB — HCV INTERPRETATION

## 2022-02-19 LAB — TSH: TSH: 0.911 u[IU]/mL (ref 0.450–4.500)

## 2022-02-19 LAB — VITAMIN D 25 HYDROXY (VIT D DEFICIENCY, FRACTURES): Vit D, 25-Hydroxy: 16.5 ng/mL — ABNORMAL LOW (ref 30.0–100.0)

## 2022-02-19 MED ORDER — VITAMIN D (ERGOCALCIFEROL) 1.25 MG (50000 UNIT) PO CAPS: 50000.0000 [IU] | ORAL_CAPSULE | ORAL | 2 refills | Status: DC

## 2022-02-19 NOTE — Addendum Note (Signed)
Addended by: Kennieth Rad on: 02/19/2022 08:00 AM ? ? Modules accepted: Orders ? ?

## 2022-02-22 ENCOUNTER — Other Ambulatory Visit (HOSPITAL_BASED_OUTPATIENT_CLINIC_OR_DEPARTMENT_OTHER): Payer: Self-pay

## 2022-02-22 ENCOUNTER — Emergency Department (HOSPITAL_BASED_OUTPATIENT_CLINIC_OR_DEPARTMENT_OTHER)
Admission: EM | Admit: 2022-02-22 | Discharge: 2022-02-22 | Disposition: A | Discharge status: Home / Self Care | Payer: Medicaid Other | Attending: Emergency Medicine | Admitting: Emergency Medicine

## 2022-02-22 ENCOUNTER — Other Ambulatory Visit: Payer: Self-pay

## 2022-02-22 ENCOUNTER — Encounter (HOSPITAL_BASED_OUTPATIENT_CLINIC_OR_DEPARTMENT_OTHER): Payer: Self-pay

## 2022-02-22 DIAGNOSIS — T7840XA Allergy, unspecified, initial encounter: Secondary | ICD-10-CM

## 2022-02-22 DIAGNOSIS — T782XXA Anaphylactic shock, unspecified, initial encounter: Secondary | ICD-10-CM

## 2022-02-22 DIAGNOSIS — Z91040 Latex allergy status: Secondary | ICD-10-CM | POA: Insufficient documentation

## 2022-02-22 DIAGNOSIS — T7804XA Anaphylactic reaction due to fruits and vegetables, initial encounter: Secondary | ICD-10-CM | POA: Insufficient documentation

## 2022-02-22 DIAGNOSIS — J45909 Unspecified asthma, uncomplicated: Secondary | ICD-10-CM | POA: Insufficient documentation

## 2022-02-22 DIAGNOSIS — Z7952 Long term (current) use of systemic steroids: Secondary | ICD-10-CM | POA: Insufficient documentation

## 2022-02-22 MED ORDER — FAMOTIDINE IN NACL 20-0.9 MG/50ML-% IV SOLN
20.0000 mg | Freq: Once | INTRAVENOUS | Status: AC
  Administered 2022-02-22: 20 mg via INTRAVENOUS
  Filled 2022-02-22: qty 50

## 2022-02-22 MED ORDER — PREDNISONE 10 MG PO TABS
20.0000 mg | ORAL_TABLET | Freq: Every day | ORAL | 0 refills | Status: DC
  Filled 2022-02-22: qty 6, 3d supply, fill #0

## 2022-02-22 MED ORDER — EPINEPHRINE 0.3 MG/0.3ML IJ SOAJ
0.3000 mg | INTRAMUSCULAR | 0 refills | Status: DC | PRN
  Filled 2022-02-22: qty 2, 15d supply, fill #0

## 2022-02-22 MED ORDER — EPINEPHRINE 0.3 MG/0.3ML IJ SOAJ
INTRAMUSCULAR | Status: AC
  Administered 2022-02-22: 0.3 mg via INTRAMUSCULAR
  Filled 2022-02-22: qty 0.3

## 2022-02-22 MED ORDER — EPINEPHRINE 0.3 MG/0.3ML IJ SOAJ: 0.3000 mg | INTRAMUSCULAR | 0 refills | Status: AC | PRN

## 2022-02-22 MED ORDER — DEXAMETHASONE 4 MG PO TABS
10.0000 mg | ORAL_TABLET | Freq: Once | ORAL | Status: AC
  Administered 2022-02-22: 10 mg via ORAL
  Filled 2022-02-22: qty 3

## 2022-02-22 MED ORDER — DIPHENHYDRAMINE HCL 50 MG/ML IJ SOLN
25.0000 mg | Freq: Once | INTRAMUSCULAR | Status: AC
  Administered 2022-02-22: 25 mg via INTRAVENOUS
  Filled 2022-02-22: qty 1

## 2022-02-22 MED ORDER — METHYLPREDNISOLONE SODIUM SUCC 125 MG IJ SOLR
125.0000 mg | Freq: Every day | INTRAMUSCULAR | Status: DC
Start: 2022-02-22 — End: 2022-02-22
  Administered 2022-02-22: 125 mg via INTRAVENOUS
  Filled 2022-02-22: qty 2

## 2022-02-22 MED ORDER — CETIRIZINE HCL 10 MG PO TABS: 10.0000 mg | ORAL_TABLET | Freq: Every day | ORAL | 0 refills | Status: DC

## 2022-02-22 MED ORDER — EPINEPHRINE 0.3 MG/0.3ML IJ SOAJ: 0.3000 mg | Freq: Once | INTRAMUSCULAR | Status: AC

## 2022-02-22 MED ORDER — PREDNISONE 10 MG PO TABS: 20.0000 mg | ORAL_TABLET | Freq: Every day | ORAL | 0 refills | Status: AC

## 2022-02-22 MED ORDER — CETIRIZINE HCL 10 MG PO TABS
10.0000 mg | ORAL_TABLET | Freq: Every day | ORAL | 0 refills | Status: DC
  Filled 2022-02-22: qty 100, 100d supply, fill #0

## 2022-02-22 MED ORDER — LACTATED RINGERS IV BOLUS
1000.0000 mL | Freq: Once | INTRAVENOUS | Status: AC
  Administered 2022-02-22: 1000 mL via INTRAVENOUS

## 2022-02-22 NOTE — Discharge Instructions (Addendum)
Call your primary care doctor or specialist as discussed in the next 2-3 days.   Return immediately back to the ER if:  Your symptoms worsen within the next 12-24 hours. You develop new symptoms such as new fevers, persistent vomiting, new pain, shortness of breath, or new weakness or numbness, or if you have any other concerns.  

## 2022-02-22 NOTE — ED Triage Notes (Signed)
ED PROVIDER AT BEDSIDE ? ?Pt arrives ambulatory from [facility] with reports of allergic reaction to tomatoes today states that she took benadryl PTA. Pt is having trouble speaking.  ?

## 2022-02-22 NOTE — ED Provider Notes (Signed)
?[facility] EMERGENCY DEPARTMENT ?Provider Note ? ? ?CSN: [id number] ?Arrival date & time: 02/22/22  1252 ? ?  ? ?History ? ?Chief Complaint  ?Patient presents with  ? Allergic Reaction  ? ? ?Beth Jacobs is a 51 y.o. female. ? ?HPI ? ?  ? ?51yo female with history of substance abuse at [facility], bipolar 2 disorder, generalized anxiety disorder, PTSD, chronic back pain, hx of allergies, presents with concern for voice changes/sensation of throat swelling and itching after having tomato at lunch. ? ?Began as itching across her chest, symptoms started about 1 hour ago. Mild dyspnea, states has sensation of itching and swelling in throat, voice changing, some difficulty swallowing.  No nausea, vomiting. Has itching but hasn't noted a rash. No diarrhea.  NO lightheadedness. Has never had epi before, normally when has tomatoes just has itching.  ? ?Past Medical History:  ?Diagnosis Date  ? Asthma   ? Cocaine abuse ([place])   ? History of allergic reaction 02/26/2013  ? Preterm labor   ? Torn Achilles tendon   ?  ? ?Home Medications ?Prior to Admission medications   ?Medication Sig Start Date End Date Taking? Authorizing Provider  ?albuterol (PROVENTIL HFA;VENTOLIN HFA) 108 (90 Base) MCG/ACT inhaler Inhale 1-2 puffs into the lungs every 6 (six) hours as needed for wheezing or shortness of breath. 04/25/17  Yes Virgel Manifold, MD  ?citalopram (CELEXA) 20 MG tablet Take 1 tablet (20 mg total) by mouth at bedtime. 02/18/22  Yes Mayers, Cari S, PA-C  ?lamoTRIgine (LAMICTAL) 25 MG tablet Take 2 tablets (50 mg total) by mouth daily. 02/18/22  Yes Mayers, Cari S, PA-C  ?Multiple Vitamin (MULTIVITAMIN ADULT PO) Take 1 tablet by mouth daily.   Yes [provider]  ?Vitamin D, Ergocalciferol, (DRISDOL) 1.25 MG (50000 UNIT) CAPS capsule Take 1 capsule (50,000 Units total) by mouth every 7 (seven) days. ?Patient taking differently: Take 50,000 Units by mouth every 7 (seven) days. Fridays 02/19/22   Yes Mayers, Cari S, PA-C  ?cetirizine (ZYRTEC ALLERGY) 10 MG tablet Take 1 tablet (10 mg total) by mouth daily for 7 days. 02/22/22 03/01/22  Luna Fuse, MD  ?EPINEPHrine 0.3 mg/0.3 mL IJ SOAJ injection Inject 0.3 mg into the muscle as needed for anaphylaxis. 02/22/22   Luna Fuse, MD  ?predniSONE (DELTASONE) 10 MG tablet Take 2 tablets (20 mg total) by mouth daily for 5 days. 02/22/22 02/27/22  Luna Fuse, MD  ?   ? ?Allergies    ?Bee venom, Hydrocodone-acetaminophen, Latex, and Tomato   ? ?Review of Systems   ?Review of Systems ?See above ?Physical Exam ?Updated Vital Signs ?BP 106/65   Pulse 78   Temp 98.2 ?F (36.8 ?C)   Resp 16   Ht '5\' 1"'$  (1.549 m)   Wt 77.1 kg   SpO2 95%   BMI 32.12 kg/m?  ?Physical Exam ?Vitals and nursing note reviewed.  ?Constitutional:   ?   General: She is not in acute distress. ?   Appearance: She is well-developed. She is not diaphoretic.  ?HENT:  ?   Head: Normocephalic and atraumatic.  ?Eyes:  ?   Conjunctiva/sclera: Conjunctivae normal.  ?Cardiovascular:  ?   Rate and Rhythm: Normal rate and regular rhythm.  ?   Heart sounds: Normal heart sounds. No murmur heard. ?  No friction rub. No gallop.  ?Pulmonary:  ?   Effort: Pulmonary effort is normal. No respiratory distress.  ?   Breath sounds: Normal  breath sounds. Stridor present. No wheezing or rales.  ?Abdominal:  ?   General: There is no distension.  ?   Palpations: Abdomen is soft.  ?   Tenderness: There is no abdominal tenderness. There is no guarding.  ?Musculoskeletal:     ?   General: No tenderness.  ?   Cervical back: Normal range of motion.  ?Skin: ?   General: Skin is warm and dry.  ?   Findings: Rash (erythema, scratches to chest) present. No erythema.  ?Neurological:  ?   Mental Status: She is alert and oriented to person, place, and time.  ? ? ?ED Results / Procedures / Treatments   ?Labs ?(all labs ordered are listed, but only abnormal results are displayed) ?Labs Reviewed - No data to  display ? ?EKG ?None ? ?Radiology ?No results found. ? ?Procedures ?Procedures  ? ? ?Medications Ordered in ED ?Medications  ?methylPREDNISolone sodium succinate (SOLU-MEDROL) 125 mg/2 mL injection 125 mg (125 mg Intravenous Given 02/22/22 1312)  ?lactated ringers bolus 1,000 mL (0 mLs Intravenous Stopped 02/22/22 1501)  ?diphenhydrAMINE (BENADRYL) injection 25 mg (25 mg Intravenous Given 02/22/22 1313)  ?famotidine (PEPCID) IVPB 20 mg premix (0 mg Intravenous Stopped 02/22/22 1343)  ?EPINEPHrine (EPI-PEN) injection 0.3 mg (0.3 mg Intramuscular Given 02/22/22 1307)  ?dexamethasone (DECADRON) tablet 10 mg (10 mg Oral Given 02/22/22 1648)  ? ? ?ED Course/ Medical Decision Making/ A&P ?  ?                        ?Medical Decision Making ?Risk ?OTC drugs. ?Prescription drug management. ? ? ? ? ?51yo female with history of substance abuse at [facility], bipolar 2 disorder, generalized anxiety disorder, PTSD, chronic back pain, hx of allergies, presents with concern for voice changes/sensation of throat swelling and itching after having tomato at lunch. ? ?History and exam concerning for anaphylaxis.  ? ?Given epinephrine, solumedrol, benadryl, famotidine with improvement.   ? ?No signs of rebound at this time, will observe for 4hr  If no rebound, will plan on dc prednisone/epi pen and recommend benadryl/famotidine. Signed out to Dr. Almyra Free with reeval pending. ? ? ? ? ? ? ? ?Final Clinical Impression(s) / ED Diagnoses ?Final diagnoses:  ?Anaphylaxis, initial encounter  ?Allergic reaction, initial encounter  ? ? ?Rx / DC Orders ?ED Discharge Orders   ? ?      Ordered  ?  EPINEPHrine 0.3 mg/0.3 mL IJ SOAJ injection  As needed,   Status:  Discontinued       ? 02/22/22 1559  ?  predniSONE (DELTASONE) 10 MG tablet  Daily,   Status:  Discontinued       ? 02/22/22 1559  ?  cetirizine (ZYRTEC ALLERGY) 10 MG tablet  Daily,   Status:  Discontinued       ? 02/22/22 1647  ?  cetirizine (ZYRTEC ALLERGY) 10 MG tablet   Daily       ? 02/22/22 1703  ?  EPINEPHrine 0.3 mg/0.3 mL IJ SOAJ injection  As needed       ? 02/22/22 1703  ?  predniSONE (DELTASONE) 10 MG tablet  Daily       ? 02/22/22 1703  ? ?  ?  ? ?  ? ? ?  ?Gareth Morgan, MD ?02/22/22 2111 ? ?

## 2022-02-22 NOTE — ED Notes (Signed)
Patient assessed upon arrival to room 14. BBS clear, but some stridor noted. SAT 100%. MD at bedside ?

## 2022-02-22 NOTE — ED Provider Notes (Signed)
Upon my evaluation patient states that she feels much better.  Symptoms are nearly completely gone. ? ?She will follow-up with her doctor this week.  Given additional Decadron before leaving.  Prescription of an EpiPen and additional steroids provided for the patient. ?  ?Luna Fuse, MD ?02/22/22 1644 ? ?

## 2022-02-23 ENCOUNTER — Other Ambulatory Visit (HOSPITAL_BASED_OUTPATIENT_CLINIC_OR_DEPARTMENT_OTHER): Payer: Self-pay

## 2022-02-25 ENCOUNTER — Telehealth: Payer: Self-pay | Admitting: *Deleted

## 2022-02-25 NOTE — Telephone Encounter (Signed)
-----   Message from Kennieth Rad, PA-C sent at 02/19/2022  8:00 AM EDT ----- ?Please call patient and let her know that her thyroid function, kidney and liver function are within normal limits, she does not show signs of anemia.  Her screening for hepatitis C is negative.  Her vitamin D levels are low, she will take 50,000 units once a week for the next 12 weeks and should have it rechecked at that time.  Prescription sent to her pharmacy. ?

## 2022-02-25 NOTE — Telephone Encounter (Signed)
MA spoke with RN Silva Bandy and shared patients results, Patient will have levels rechecked in 3 months. ?

## 2022-03-08 ENCOUNTER — Encounter: Payer: Self-pay | Admitting: Physician Assistant

## 2022-03-08 ENCOUNTER — Ambulatory Visit (INDEPENDENT_AMBULATORY_CARE_PROVIDER_SITE_OTHER): Payer: 59 | Admitting: Physician Assistant

## 2022-03-08 VITALS — BP 117/80 | HR 80 | Temp 98.2°F | Resp 18 | Ht 61.0 in | Wt 171.0 lb

## 2022-03-08 DIAGNOSIS — F411 Generalized anxiety disorder: Secondary | ICD-10-CM

## 2022-03-08 DIAGNOSIS — E559 Vitamin D deficiency, unspecified: Secondary | ICD-10-CM

## 2022-03-08 DIAGNOSIS — F3181 Bipolar II disorder: Secondary | ICD-10-CM | POA: Diagnosis not present

## 2022-03-08 DIAGNOSIS — F4310 Post-traumatic stress disorder, unspecified: Secondary | ICD-10-CM

## 2022-03-08 DIAGNOSIS — G2571 Drug induced akathisia: Secondary | ICD-10-CM

## 2022-03-08 DIAGNOSIS — F1410 Cocaine abuse, uncomplicated: Secondary | ICD-10-CM

## 2022-03-08 NOTE — Progress Notes (Signed)
Patient has eaten today and taken medication today. ?Patient reports back pain at a 6 currently  ?Patient reports increased anxiety. Patient will transition out of program on Thursday. ?

## 2022-03-08 NOTE — Progress Notes (Signed)
? ?Established Patient Office Visit ? ?Subjective:  ?Patient ID: Beth Jacobs, female    DOB: July 02, 1971  Age: 51 y.o. MRN: [id number] ? ?CC:  ?Chief Complaint  ?Patient presents with  ? Medication Management  ? ? ?HPI ?Beth Jacobs states that she will need to be treated for subs abuse at [facility] residential treatment center.  States that she is leaving Thursday to go to [facility] in [place] for aftercare. ? ?States that she feels she has been having increased feelings of anxiety.  States that she has difficulty sitting still, states that her legs seem like they are moving all the time, even when she is laying in bed.  States that she has been overly irritable as well.  States that she has been taking the hydroxyzine 50 mg during the day and will take 100 mg at bedtime.  States that this is not offering much relief. ? ? ? ?  03/08/2022  ?  9:05 AM 02/18/2022  ?  9:03 AM 06/29/2021  ? 10:14 AM  ?Depression screen PHQ 2/9  ?Decreased Interest 3 3 3   ?Down, Depressed, Hopeless 2 2 3   ?PHQ - 2 Score 5 5 6   ?Altered sleeping 3 3 3   ?Tired, decreased energy 0 3 3  ?Change in appetite 3 3 3   ?Feeling bad or failure about yourself  0 2 3  ?Trouble concentrating 2 3 3   ?Moving slowly or fidgety/restless 2 3 3   ?Suicidal thoughts 0 2 3  ?PHQ-9 Score 15 24 27   ? ? ? ?Past Medical History:  ?Diagnosis Date  ? Asthma   ? Cocaine abuse ([place])   ? History of allergic reaction 02/26/2013  ? Preterm labor   ? Torn Achilles tendon   ? ? ?Past Surgical History:  ?Procedure Laterality Date  ? DILATION AND EVACUATION    ? NO PAST SURGERIES    ? WISDOM TOOTH EXTRACTION    ? ? ?Family History  ?Problem Relation Age of Onset  ? Cerebral palsy Son   ?     born at [redacted] weeks gestation  ? Cervical cancer Mother   ? Other Neg Hx   ? ? ?Social History  ? ?Socioeconomic History  ? Marital status: Legally Separated  ?  Spouse name: Not on file  ? Number of children: Not on file  ? Years of education: Not on file  ? Highest education level: Not on  file  ?Occupational History  ? Not on file  ?Tobacco Use  ? Smoking status: Never  ? Smokeless tobacco: Never  ?Vaping Use  ? Vaping Use: Never used  ?Substance and Sexual Activity  ? Alcohol use: No  ? Drug use: Not Currently  ?  Types: Cocaine  ? Sexual activity: Yes  ?  Partners: Male  ?  Birth control/protection: None  ?Other Topics Concern  ? Not on file  ?Social History Narrative  ? Not on file  ? ?Social Determinants of Health  ? ?Financial Resource Strain: Not on file  ?Food Insecurity: Not on file  ?Transportation Needs: Not on file  ?Physical Activity: Not on file  ?Stress: Not on file  ?Social Connections: Not on file  ?Intimate Partner Violence: Not on file  ? ? ?Outpatient Medications Prior to Visit  ?Medication Sig Dispense Refill  ? albuterol (PROVENTIL HFA;VENTOLIN HFA) 108 (90 Base) MCG/ACT inhaler Inhale 1-2 puffs into the lungs every 6 (six) hours as needed for wheezing or shortness of breath. 1 Inhaler 0  ?  cetirizine (ZYRTEC ALLERGY) 10 MG tablet Take 1 tablet (10 mg total) by mouth daily for 7 days. 7 tablet 0  ? citalopram (CELEXA) 20 MG tablet Take 1 tablet (20 mg total) by mouth at bedtime. 30 tablet 1  ? EPINEPHrine 0.3 mg/0.3 mL IJ SOAJ injection Inject 0.3 mg into the muscle as needed for anaphylaxis. 2 each 0  ? lamoTRIgine (LAMICTAL) 25 MG tablet Take 2 tablets (50 mg total) by mouth daily. 60 tablet 1  ? Multiple Vitamin (MULTIVITAMIN ADULT PO) Take 1 tablet by mouth daily.    ? Vitamin D, Ergocalciferol, (DRISDOL) 1.25 MG (50000 UNIT) CAPS capsule Take 1 capsule (50,000 Units total) by mouth every 7 (seven) days. (Patient taking differently: Take 50,000 Units by mouth every 7 (seven) days. Fridays) 4 capsule 2  ? ?No facility-administered medications prior to visit.  ? ? ?Allergies  ?Allergen Reactions  ? Bee Venom Anaphylaxis  ?  "Affects oxygen level"  ? Hydrocodone-Acetaminophen Nausea And Vomiting  ?  Patient can take tylenol products and oxycodone , its only hydrocodone that  causes issues  ? Latex Hives  ? Tomato Swelling  ? ? ?ROS ?Review of Systems  ?Constitutional: Negative.   ?HENT: Negative.    ?Eyes: Negative.   ?Respiratory:  Negative for shortness of breath.   ?Cardiovascular:  Negative for chest pain.  ?Gastrointestinal: Negative.   ?Endocrine: Negative.   ?Genitourinary: Negative.   ?Musculoskeletal: Negative.   ?Skin: Negative.   ?Allergic/Immunologic: Negative.   ?Neurological: Negative.   ?Hematological: Negative.   ?Psychiatric/Behavioral:  Positive for agitation. Negative for dysphoric mood, self-injury, sleep disturbance and suicidal ideas. The patient is nervous/anxious.   ? ?  ?Objective:  ?  ?Physical Exam ?Vitals and nursing note reviewed.  ?Constitutional:   ?   Appearance: Normal appearance.  ?   Comments: Very fidgety   ?HENT:  ?   Head: Normocephalic and atraumatic.  ?   Right Ear: External ear normal.  ?   Left Ear: External ear normal.  ?   Nose: Nose normal.  ?   Mouth/Throat:  ?   Mouth: Mucous membranes are moist.  ?   Pharynx: Oropharynx is clear.  ?Eyes:  ?   Extraocular Movements: Extraocular movements intact.  ?   Conjunctiva/sclera: Conjunctivae normal.  ?   Pupils: Pupils are equal, round, and reactive to light.  ?Cardiovascular:  ?   Rate and Rhythm: Normal rate and regular rhythm.  ?   Pulses: Normal pulses.  ?   Heart sounds: Normal heart sounds.  ?Pulmonary:  ?   Effort: Pulmonary effort is normal.  ?   Breath sounds: Normal breath sounds.  ?Musculoskeletal:     ?   General: Normal range of motion.  ?   Cervical back: Normal range of motion and neck supple.  ?Skin: ?   General: Skin is warm and dry.  ?Neurological:  ?   General: No focal deficit present.  ?   Mental Status: She is alert and oriented to person, place, and time.  ?Psychiatric:     ?   Mood and Affect: Mood normal.     ?   Behavior: Behavior normal.     ?   Thought Content: Thought content normal.     ?   Judgment: Judgment normal.  ? ? ?BP 117/80 (BP Location: Left Arm, Patient  Position: Sitting, Cuff Size: Normal)   Pulse 80   Temp 98.2 ?F (36.8 ?C) (Oral)   Resp 18  Ht 5' 1"  (1.549 m)   Wt 171 lb (77.6 kg)   LMP 03/08/2022   SpO2 100%   BMI 32.31 kg/m?  ?Wt Readings from Last 3 Encounters:  ?03/08/22 171 lb (77.6 kg)  ?02/22/22 170 lb (77.1 kg)  ?02/18/22 170 lb (77.1 kg)  ? ? ? ?Health Maintenance Due  ?Topic Date Due  ? COVID-19 Vaccine (1) Never done  ? COLONOSCOPY (Pts 45-61yr Insurance coverage will need to be confirmed)  Never done  ? PAP SMEAR-Modifier  10/03/2021  ? ? ?There are no preventive care reminders to display for this patient. ? ?Lab Results  ?Component Value Date  ? TSH 0.911 02/18/2022  ? ?Lab Results  ?Component Value Date  ? WBC 4.2 02/18/2022  ? HGB 13.3 02/18/2022  ? HCT 41.3 02/18/2022  ? MCV 87 02/18/2022  ? PLT 420 02/18/2022  ? ?Lab Results  ?Component Value Date  ? NA 139 02/18/2022  ? K 4.7 02/18/2022  ? CO2 28 04/16/2019  ? GLUCOSE 100 (H) 02/18/2022  ? BUN 16 02/18/2022  ? CREATININE 0.89 02/18/2022  ? BILITOT 0.9 02/18/2022  ? ALKPHOS 86 02/18/2022  ? AST 19 02/18/2022  ? ALT 13 04/16/2019  ? PROT 8.0 02/18/2022  ? ALBUMIN 4.8 02/18/2022  ? CALCIUM 10.1 02/18/2022  ? ANIONGAP 3 (L) 04/16/2019  ? EGFR 79 02/18/2022  ? ?No results found for: CHOL ?No results found for: HDL ?No results found for: [place]?No results found for: TRIG ?No results found for: CHOLHDL ?No results found for: HGBA1C ? ?  ?Assessment & Plan:  ? ?Problem List Items Addressed This Visit   ? ?  ? Other  ? Bipolar 2 disorder ([facility] [facility]  ? Generalized anxiety disorder  ? PTSD (post-traumatic stress disorder)  ? [place] ([place]  ? Vitamin D deficiency  ? ?Other Visit Diagnoses   ? ? Drug induced akathisia      ? ?  ? ? ?No orders of the defined types were placed in this encounter. ?1. Bipolar 2 disorder ([address] ?Lamictal was increased at last office visit due to having to discontinue Abilify due to similar adverse effects.  Reduce Lamictal 25 mg.  Patient to follow-up with  mobile team in 2 weeks.  Red flags given for prompt reevaluation ? ?2. Drug induced akathisia ?Increased anxiety versus drug-induced akathisia ? ?3. Generalized anxiety disorder ?Continue hydroxyzine as needed ? ?4. P

## 2022-03-08 NOTE — Patient Instructions (Signed)
You are going to take a reduced dose of the Lamictal.  You can take 1-1/2 hydroxyzine during the day to help with your anxiety. ? ?You will follow-up with the mobile team in 2 weeks. ? ?Kennieth Rad, PA-C ?Physician Assistant ?[facility] ?[email or web address] ? ? ? ?

## 2022-03-19 ENCOUNTER — Telehealth: Payer: Self-pay | Admitting: *Deleted

## 2022-03-19 NOTE — Telephone Encounter (Signed)
MA UTR patient and LVM to be scheduled for 2 week FU with [facility] ?

## 2022-05-05 NOTE — Progress Notes (Signed)
Erroneous encounter-disregard

## 2022-05-13 ENCOUNTER — Encounter: Payer: Medicaid Other | Admitting: Family

## 2022-05-18 ENCOUNTER — Ambulatory Visit: Payer: 59 | Admitting: Obstetrics

## 2022-05-18 ENCOUNTER — Ambulatory Visit: Payer: 59 | Admitting: Obstetrics and Gynecology

## 2022-06-12 ENCOUNTER — Encounter (HOSPITAL_COMMUNITY): Payer: Self-pay

## 2022-06-12 ENCOUNTER — Other Ambulatory Visit: Payer: Self-pay

## 2022-06-12 ENCOUNTER — Emergency Department (HOSPITAL_COMMUNITY)
Admission: EM | Admit: 2022-06-12 | Discharge: 2022-06-12 | Disposition: A | Discharge status: Home / Self Care | Payer: 59 | Attending: Emergency Medicine | Admitting: Emergency Medicine

## 2022-06-12 DIAGNOSIS — M25521 Pain in right elbow: Secondary | ICD-10-CM | POA: Insufficient documentation

## 2022-06-12 DIAGNOSIS — M25522 Pain in left elbow: Secondary | ICD-10-CM | POA: Insufficient documentation

## 2022-06-12 DIAGNOSIS — Z91040 Latex allergy status: Secondary | ICD-10-CM | POA: Diagnosis not present

## 2022-06-12 DIAGNOSIS — K029 Dental caries, unspecified: Secondary | ICD-10-CM | POA: Diagnosis not present

## 2022-06-12 DIAGNOSIS — K0889 Other specified disorders of teeth and supporting structures: Secondary | ICD-10-CM | POA: Diagnosis present

## 2022-06-12 MED ORDER — NAPROXEN 500 MG PO TABS: 500.0000 mg | ORAL_TABLET | Freq: Two times a day (BID) | ORAL | 0 refills | Status: DC

## 2022-06-12 MED ORDER — PENICILLIN V POTASSIUM 500 MG PO TABS: 500.0000 mg | ORAL_TABLET | Freq: Three times a day (TID) | ORAL | 0 refills | Status: DC

## 2022-06-12 NOTE — ED Triage Notes (Signed)
Pt c/o left sided dental pain x 2 weeks. Pt also endorses pain in bilateral elbows for 1 weeks. States the pain in her elbows worsen when she moves her arms.denies injury

## 2022-06-12 NOTE — ED Provider Notes (Signed)
[facility] DEPT Provider Note   CSN: [id number] Arrival date & time: 06/12/22  1753     History  Chief Complaint  Patient presents with   Dental Pain   Elbow Pain    Beth Jacobs is a 51 y.o. female.  The history is provided by the patient and medical records. No language interpreter was used.  Dental Pain   51 year old female with history of polysubstance abuse presenting with complaints of dental pain.  Patient reports she has poor dentition and for the past week and a half she has noticed increasing pain to her left upper gum.  Pain is sharp throbbing worse with chewing.  She also felt pain to both elbows for the past week.  Pain is aching worse with movement.  She denies any fever, chest pain, trouble breathing, shoulder pain, and denies any recent injury.  She tries taking Tylenol at home with minimal relief.  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  She has not tried any NSAIDs.  She does not have a dentist.  Home Medications Prior to Admission medications   Medication Sig Start Date End Date Taking? Authorizing Provider  albuterol (PROVENTIL HFA;VENTOLIN HFA) 108 (90 Base) MCG/ACT inhaler Inhale 1-2 puffs into the lungs every 6 (six) hours as needed for wheezing or shortness of breath. 04/25/17   Virgel Manifold, MD  cetirizine (ZYRTEC ALLERGY) 10 MG tablet Take 1 tablet (10 mg total) by mouth daily for 7 days. 02/22/22 03/08/22  Luna Fuse, MD  citalopram (CELEXA) 20 MG tablet Take 1 tablet (20 mg total) by mouth at bedtime. 02/18/22   Mayers, Cari S, PA-C  EPINEPHrine 0.3 mg/0.3 mL IJ SOAJ injection Inject 0.3 mg into the muscle as needed for anaphylaxis. 02/22/22   Luna Fuse, MD  lamoTRIgine (LAMICTAL) 25 MG tablet Take 2 tablets (50 mg total) by mouth daily. 02/18/22   Mayers, Cari S, PA-C  Multiple Vitamin (MULTIVITAMIN ADULT PO) Take 1 tablet by mouth daily.    [provider]  Vitamin D, Ergocalciferol, (DRISDOL) 1.25  MG (50000 UNIT) CAPS capsule Take 1 capsule (50,000 Units total) by mouth every 7 (seven) days. Patient taking differently: Take 50,000 Units by mouth every 7 (seven) days. Fridays 02/19/22   Mayers, Cari S, PA-C      Allergies    Bee venom, Hydrocodone-acetaminophen, Latex, and Tomato    Review of Systems   Review of Systems  All other systems reviewed and are negative.   Physical Exam Updated Vital Signs BP 130/82 (BP Location: Right Arm)   Pulse 89   Temp 98.7 F (37.1 C) (Oral)   Resp 16   Ht '5\' 1"'$  (1.549 m)   Wt 77.1 kg   SpO2 99%   BMI 32.12 kg/m  Physical Exam Vitals and nursing note reviewed.  Constitutional:      General: She is not in acute distress.    Appearance: She is well-developed.  HENT:     Head: Atraumatic.     Mouth/Throat:     Comments: Widespread dental decay with tenderness to left upper gum and gingival erythema noted.  No abscess noted. Eyes:     Conjunctiva/sclera: Conjunctivae normal.  Pulmonary:     Effort: Pulmonary effort is normal.  Musculoskeletal:        General: Tenderness (Tenderness about bilateral elbow with normal flexion extension and no overlying skin changes no erythema edema or warmth appreciated) present.     Cervical back:  Neck supple.  Skin:    Findings: No rash.  Neurological:     Mental Status: She is alert.  Psychiatric:        Mood and Affect: Mood normal.     ED Results / Procedures / Treatments   Labs (all labs ordered are listed, but only abnormal results are displayed) Labs Reviewed - No data to display  EKG None  Radiology No results found.  Procedures Procedures    Medications Ordered in ED Medications - No data to display  ED Course/ Medical Decision Making/ A&P                           Medical Decision Making  BP 130/82 (BP Location: Right Arm)   Pulse 89   Temp 98.7 F (37.1 C) (Oral)   Resp 16   Ht '5\' 1"'$  (1.549 m)   Wt 77.1 kg   SpO2 99%   BMI 32.12 kg/m   6:43 PM Patient  here with 2 complaints.  First she complains of ongoing dental pain for the past 2 weeks.  She has poor dental decay and on exam patient has inflamed left upper gum along with dental decay concerning for infection.  No evidence of deep tissue infection.  Will prescribe antibiotic and will give dental referral.  Patient also complained of atraumatic bilateral elbow pain.  She denies any repetitive movement and denies any recent injury.  On exam she does have some tenderness about the elbow with normal flexion extension and no overlying skin changes.  Doubt septic arthritis doubt fracture or dislocation.  Likely MSK pain.  Will prescribe anti-inflammatory medication.  I have consider x-ray but felt it is low yield due to lack of trauma to the affected area.  I have reviewed patient's prior notes, EMR notes, labs and imaging and considered my plan of care.  Patient's vital signs stable.  At this time she is stable to be discharged home.  I have considered patient social determinant of health which include depression in my plan of care        Final Clinical Impression(s) / ED Diagnoses Final diagnoses:  Pain due to dental caries  Pain of both elbows    Rx / DC Orders ED Discharge Orders          Ordered    naproxen (NAPROSYN) 500 MG tablet  2 times daily        06/12/22 1845    penicillin v potassium (VEETID) 500 MG tablet  3 times daily        06/12/22 1845              Domenic Moras, PA-C 06/12/22 1846    Ezequiel Essex, MD 06/12/22 [phone]

## 2022-06-12 NOTE — Discharge Instructions (Addendum)
Please take antibiotic as prescribed as treatment of dental infection.  Follow-up with dentist for further care.  Take naproxen for both dental pain and elbow pain.

## 2022-06-14 ENCOUNTER — Emergency Department (HOSPITAL_COMMUNITY)
Admission: EM | Admit: 2022-06-14 | Discharge: 2022-06-14 | Disposition: A | Discharge status: Home / Self Care | Payer: 59 | Attending: Emergency Medicine | Admitting: Emergency Medicine

## 2022-06-14 ENCOUNTER — Encounter (HOSPITAL_COMMUNITY): Payer: Self-pay | Admitting: Emergency Medicine

## 2022-06-14 ENCOUNTER — Emergency Department (HOSPITAL_COMMUNITY): Payer: 59

## 2022-06-14 ENCOUNTER — Emergency Department (HOSPITAL_BASED_OUTPATIENT_CLINIC_OR_DEPARTMENT_OTHER)
Admit: 2022-06-14 | Discharge: 2022-06-14 | Disposition: A | Discharge status: Home / Self Care | Payer: 59 | Attending: Emergency Medicine | Admitting: Emergency Medicine

## 2022-06-14 DIAGNOSIS — Z91040 Latex allergy status: Secondary | ICD-10-CM | POA: Diagnosis not present

## 2022-06-14 DIAGNOSIS — M79604 Pain in right leg: Secondary | ICD-10-CM | POA: Diagnosis not present

## 2022-06-14 DIAGNOSIS — M25522 Pain in left elbow: Secondary | ICD-10-CM | POA: Diagnosis not present

## 2022-06-14 DIAGNOSIS — M79601 Pain in right arm: Secondary | ICD-10-CM | POA: Diagnosis present

## 2022-06-14 DIAGNOSIS — M791 Myalgia, unspecified site: Secondary | ICD-10-CM | POA: Diagnosis not present

## 2022-06-14 LAB — BASIC METABOLIC PANEL
Anion gap: 6 (ref 5–15)
BUN: 14 mg/dL (ref 6–20)
CO2: 28 mmol/L (ref 22–32)
Calcium: 9.3 mg/dL (ref 8.9–10.3)
Chloride: 107 mmol/L (ref 98–111)
Creatinine, Ser: 0.59 mg/dL (ref 0.44–1.00)
GFR, Estimated: >60 mL/min (ref >60)
Glucose, Bld: 105 mg/dL — ABNORMAL HIGH (ref 70–99)
Potassium: 3.9 mmol/L (ref 3.5–5.1)
Sodium: 141 mmol/L (ref 135–145)

## 2022-06-14 LAB — CBC WITH DIFFERENTIAL/PLATELET
Abs Immature Granulocytes: 0.02 10*3/uL (ref 0.00–0.07)
Basophils Absolute: 0 10*3/uL (ref 0.0–0.1)
Basophils Relative: 0 %
Eosinophils Absolute: 0.1 10*3/uL (ref 0.0–0.5)
Eosinophils Relative: 2 %
HCT: 39.8 % (ref 36.0–46.0)
Hemoglobin: 12.5 g/dL (ref 12.0–15.0)
Immature Granulocytes: 0 %
Lymphocytes Relative: 37 %
Lymphs Abs: 1.7 10*3/uL (ref 0.7–4.0)
MCH: 28.5 pg (ref 26.0–34.0)
MCHC: 31.4 g/dL (ref 30.0–36.0)
MCV: 90.9 fL (ref 80.0–100.0)
Monocytes Absolute: 0.4 10*3/uL (ref 0.1–1.0)
Monocytes Relative: 9 %
Neutro Abs: 2.3 10*3/uL (ref 1.7–7.7)
Neutrophils Relative %: 52 %
Platelets: 358 10*3/uL (ref 150–400)
RBC: 4.38 MIL/uL (ref 3.87–5.11)
RDW: 13.6 % (ref 11.5–15.5)
WBC: 4.5 10*3/uL (ref 4.0–10.5)
nRBC: 0 % (ref 0.0–0.2)

## 2022-06-14 LAB — CK: Total CK: 446 U/L — ABNORMAL HIGH (ref 38–234)

## 2022-06-14 MED ORDER — IBUPROFEN 200 MG PO TABS
600.0000 mg | ORAL_TABLET | Freq: Once | ORAL | Status: AC
  Administered 2022-06-14: 600 mg via ORAL
  Filled 2022-06-14: qty 3

## 2022-06-14 NOTE — ED Provider Notes (Signed)
[facility] DEPT Provider Note   CSN: [id number] Arrival date & time: 06/14/22  0141     History  Chief Complaint  Patient presents with   Arm Pain    Beth Jacobs is a 51 y.o. female.  HPI 51 year old female presents with bilateral arm pain.  She has been noticing arm discomfort for weeks, primarily in the left elbow and the right upper arm.  No trauma.  About 3 months ago she went back to work and is working 2 jobs.  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  No weakness or numbness in the extremities.  Left elbow is sore to the touch.  Yesterday she was rubbing medicine on her arms and noticed a lump in her right medial upper arm. No urinary symptoms/change in urine color.  No trauma.  Home Medications Prior to Admission medications   Medication Sig Start Date End Date Taking? Authorizing Provider  albuterol (PROVENTIL HFA;VENTOLIN HFA) 108 (90 Base) MCG/ACT inhaler Inhale 1-2 puffs into the lungs every 6 (six) hours as needed for wheezing or shortness of breath. 04/25/17   Virgel Manifold, MD  cetirizine (ZYRTEC ALLERGY) 10 MG tablet Take 1 tablet (10 mg total) by mouth daily for 7 days. 02/22/22 03/08/22  Luna Fuse, MD  citalopram (CELEXA) 20 MG tablet Take 1 tablet (20 mg total) by mouth at bedtime. 02/18/22   Mayers, Cari S, PA-C  EPINEPHrine 0.3 mg/0.3 mL IJ SOAJ injection Inject 0.3 mg into the muscle as needed for anaphylaxis. 02/22/22   Luna Fuse, MD  lamoTRIgine (LAMICTAL) 25 MG tablet Take 2 tablets (50 mg total) by mouth daily. 02/18/22   Mayers, Cari S, PA-C  Multiple Vitamin (MULTIVITAMIN ADULT PO) Take 1 tablet by mouth daily.    [provider]  naproxen (NAPROSYN) 500 MG tablet Take 1 tablet (500 mg total) by mouth 2 (two) times daily. 06/12/22   Domenic Moras, PA-C  penicillin v potassium (VEETID) 500 MG tablet Take 1 tablet (500 mg total) by mouth 3 (three) times daily. 06/12/22   Domenic Moras, PA-C  Vitamin D, Ergocalciferol, (DRISDOL)  1.25 MG (50000 UNIT) CAPS capsule Take 1 capsule (50,000 Units total) by mouth every 7 (seven) days. Patient taking differently: Take 50,000 Units by mouth every 7 (seven) days. Fridays 02/19/22   Mayers, Cari S, PA-C      Allergies    Bee venom, Hydrocodone-acetaminophen, Latex, and Tomato    Review of Systems   Review of Systems  Musculoskeletal:  Positive for myalgias.  Neurological:  Negative for weakness and numbness.    Physical Exam Updated Vital Signs BP 113/65 (BP Location: Left Arm)   Pulse 76   Temp 98 F (36.7 C) (Oral)   Resp 16   SpO2 96%  Physical Exam Vitals and nursing note reviewed.  Constitutional:      Appearance: She is well-developed.  HENT:     Head: Normocephalic and atraumatic.  Cardiovascular:     Rate and Rhythm: Normal rate and regular rhythm.     Pulses:          Radial pulses are 2+ on the right side and 2+ on the left side.  Pulmonary:     Effort: Pulmonary effort is normal.  Musculoskeletal:     Right upper arm: Swelling present.     Left upper arm: No tenderness.     Right elbow: Normal range of motion. No tenderness.     Left elbow: No swelling, deformity or effusion.  Normal range of motion. Tenderness present.       Arms:     Comments: Normal radial, ulnar, median nerve testing in both hands.  Normal grip strength and grossly normal sensation bilaterally  Skin:    General: Skin is warm and dry.  Neurological:     Mental Status: She is alert.     ED Results / Procedures / Treatments   Labs (all labs ordered are listed, but only abnormal results are displayed) Labs Reviewed  BASIC METABOLIC PANEL - Abnormal; Notable for the following components:      Result Value   Glucose, Bld 105 (*)    All other components within normal limits  CK - Abnormal; Notable for the following components:   Total CK 446 (*)    All other components within normal limits  CBC WITH DIFFERENTIAL/PLATELET    EKG None  Radiology UE VENOUS DUPLEX  (7am - 7pm)  Result Date: 06/14/2022 UPPER VENOUS STUDY  Patient Name:  Beth Jacobs  Date of Exam:   06/14/2022 Medical Rec #: [id number]      Accession #:    [id number] Date of Birth: 1971/06/30      Patient Gender: F Patient Age:   74 years Exam Location:  [facility] Procedure:      VAS [place] UPPER EXTREMITY VENOUS DUPLEX Referring Phys: Nicki Reaper Tariah Transue --------------------------------------------------------------------------------  Indications: Pain Risk Factors: None identified. Comparison Study: No prior studies. Performing Technologist: Oliver Hum RVT  Examination Guidelines: A complete evaluation includes B-mode imaging, spectral Doppler, color Doppler, and power Doppler as needed of all accessible portions of each vessel. Bilateral testing is considered an integral part of a complete examination. Limited examinations for reoccurring indications may be performed as noted.  Right Findings: +----------+------------+---------+-----------+----------+-------+ RIGHT     CompressiblePhasicitySpontaneousPropertiesSummary +----------+------------+---------+-----------+----------+-------+ IJV           Full       Yes       Yes                      +----------+------------+---------+-----------+----------+-------+ Subclavian    Full       Yes       Yes                      +----------+------------+---------+-----------+----------+-------+ Axillary      Full       Yes       Yes                      +----------+------------+---------+-----------+----------+-------+ Brachial      Full       Yes       Yes                      +----------+------------+---------+-----------+----------+-------+ Radial        Full                                          +----------+------------+---------+-----------+----------+-------+ Ulnar         Full                                          +----------+------------+---------+-----------+----------+-------+ Cephalic      Full                                           +----------+------------+---------+-----------+----------+-------+  Basilic       Full                                          +----------+------------+---------+-----------+----------+-------+  Left Findings: +----------+------------+---------+-----------+----------+-------+ LEFT      CompressiblePhasicitySpontaneousPropertiesSummary +----------+------------+---------+-----------+----------+-------+ Subclavian    Full       Yes       Yes                      +----------+------------+---------+-----------+----------+-------+  Summary:  Right: No evidence of deep vein thrombosis in the upper extremity. No evidence of superficial vein thrombosis in the upper extremity.  Left: No evidence of thrombosis in the subclavian.  *See table(s) above for measurements and observations.  Diagnosing physician: Monica Martinez MD Electronically signed by Monica Martinez MD on 06/14/2022 at 2:06:27 PM.    Final    DG Elbow Complete Left  Result Date: 06/14/2022 CLINICAL DATA:  Posterior and lateral elbow pain.  No known injury. EXAM: LEFT ELBOW - COMPLETE 3+ VIEW COMPARISON:  None Available. FINDINGS: The mineralization and alignment are normal. There is no evidence of acute fracture or dislocation. The joint spaces are preserved. There is mild spurring of the lateral humeral epicondyle. No evidence of joint effusion or focal soft tissue abnormality. IMPRESSION: No acute findings or significant arthropathic changes. Mild lateral humeral epicondylar spurring. Electronically Signed   By: Richardean Sale M.D.   On: 06/14/2022 10:05    Procedures Procedures    Medications Ordered in ED Medications  ibuprofen (ADVIL) tablet 600 mg (600 mg Oral Given 06/14/22 1023)    ED Course/ Medical Decision Making/ A&P                           Medical Decision Making Amount and/or Complexity of Data Reviewed Labs: ordered.    Details: CK mildly elevated, but not  c/w rhabdo. normal Renal function. Normal WBC Radiology: ordered and independent interpretation performed.    Details: No fracture/effusion on Elbow xray.  Risk OTC drugs.   I did a bedside ultrasound of her right upper extremity, did not see an obvious fluid collection/abscess. DVT u/s shows no DVT. Elbow xray unremarkable. Doubt septic joint or infection. NV intact. CK mildly elevated, but not c/w rhabdo, and symptoms are fairly localized. At this point, will recommend NSAIDs, plenty of fluids, and follow up with PCP. Arm is not c/w abscess. Review of her meds doesn't show an obvious culprit for CK elevation. D/c home with return precautions.        Final Clinical Impression(s) / ED Diagnoses Final diagnoses:  Myalgia    Rx / DC Orders ED Discharge Orders     None         Sherwood Gambler, MD 06/14/22 1656

## 2022-06-14 NOTE — Discharge Instructions (Addendum)
Your lab work indicates some mild muscle breakdown.  Be sure you are drinking appropriate amounts of fluids and you may take NSAIDs such as ibuprofen for pain relief.  You can apply warm compresses.  If you develop new or worsening pain, trouble urinating, dark or tea colored urine, or any other new/concerning symptoms then return to the ER for evaluation.

## 2022-06-14 NOTE — Progress Notes (Signed)
Right upper extremity venous duplex has been completed. Preliminary results can be found in CV Proc through chart review.  Results were given to Dr. Regenia Skeeter.  06/14/22 10:25 AM Carlos Levering RVT

## 2022-06-14 NOTE — ED Triage Notes (Signed)
Patient c/o bilateral arm pain since beginning to work second job. She denies trauma or injury to arms.

## 2022-07-03 ENCOUNTER — Encounter (HOSPITAL_COMMUNITY): Payer: Self-pay | Admitting: Emergency Medicine

## 2022-07-03 ENCOUNTER — Other Ambulatory Visit: Payer: Self-pay

## 2022-07-03 ENCOUNTER — Emergency Department (HOSPITAL_COMMUNITY)
Admission: EM | Admit: 2022-07-03 | Discharge: 2022-07-03 | Disposition: A | Discharge status: Home / Self Care | Payer: 59 | Attending: Emergency Medicine | Admitting: Emergency Medicine

## 2022-07-03 DIAGNOSIS — R109 Unspecified abdominal pain: Secondary | ICD-10-CM | POA: Insufficient documentation

## 2022-07-03 DIAGNOSIS — M79601 Pain in right arm: Secondary | ICD-10-CM

## 2022-07-03 DIAGNOSIS — K047 Periapical abscess without sinus: Secondary | ICD-10-CM | POA: Diagnosis not present

## 2022-07-03 DIAGNOSIS — Z91040 Latex allergy status: Secondary | ICD-10-CM | POA: Insufficient documentation

## 2022-07-03 DIAGNOSIS — M79622 Pain in left upper arm: Secondary | ICD-10-CM | POA: Diagnosis not present

## 2022-07-03 DIAGNOSIS — M79621 Pain in right upper arm: Secondary | ICD-10-CM | POA: Diagnosis not present

## 2022-07-03 DIAGNOSIS — M545 Low back pain, unspecified: Secondary | ICD-10-CM | POA: Insufficient documentation

## 2022-07-03 DIAGNOSIS — K0889 Other specified disorders of teeth and supporting structures: Secondary | ICD-10-CM | POA: Diagnosis present

## 2022-07-03 LAB — URINALYSIS, ROUTINE W REFLEX MICROSCOPIC
Bacteria, UA: NONE SEEN
Bilirubin Urine: NEGATIVE
Glucose, UA: NEGATIVE mg/dL
Hgb urine dipstick: NEGATIVE
Ketones, ur: NEGATIVE mg/dL
Nitrite: NEGATIVE
Protein, ur: NEGATIVE mg/dL
Specific Gravity, Urine: 1.021 (ref 1.005–1.030)
pH: 5 (ref 5.0–8.0)

## 2022-07-03 LAB — BASIC METABOLIC PANEL
Anion gap: 5 (ref 5–15)
BUN: 13 mg/dL (ref 6–20)
CO2: 25 mmol/L (ref 22–32)
Calcium: 8.9 mg/dL (ref 8.9–10.3)
Chloride: 110 mmol/L (ref 98–111)
Creatinine, Ser: 0.88 mg/dL (ref 0.44–1.00)
GFR, Estimated: >60 mL/min (ref >60)
Glucose, Bld: 103 mg/dL — ABNORMAL HIGH (ref 70–99)
Potassium: 3.6 mmol/L (ref 3.5–5.1)
Sodium: 140 mmol/L (ref 135–145)

## 2022-07-03 LAB — CBC WITH DIFFERENTIAL/PLATELET
Abs Immature Granulocytes: 0.01 10*3/uL (ref 0.00–0.07)
Basophils Absolute: 0 10*3/uL (ref 0.0–0.1)
Basophils Relative: 0 %
Eosinophils Absolute: 0.1 10*3/uL (ref 0.0–0.5)
Eosinophils Relative: 1 %
HCT: 34.8 % — ABNORMAL LOW (ref 36.0–46.0)
Hemoglobin: 11.4 g/dL — ABNORMAL LOW (ref 12.0–15.0)
Immature Granulocytes: 0 %
Lymphocytes Relative: 28 %
Lymphs Abs: 1.8 10*3/uL (ref 0.7–4.0)
MCH: 29.5 pg (ref 26.0–34.0)
MCHC: 32.8 g/dL (ref 30.0–36.0)
MCV: 90.2 fL (ref 80.0–100.0)
Monocytes Absolute: 0.6 10*3/uL (ref 0.1–1.0)
Monocytes Relative: 9 %
Neutro Abs: 3.9 10*3/uL (ref 1.7–7.7)
Neutrophils Relative %: 62 %
Platelets: 358 10*3/uL (ref 150–400)
RBC: 3.86 MIL/uL — ABNORMAL LOW (ref 3.87–5.11)
RDW: 14 % (ref 11.5–15.5)
WBC: 6.4 10*3/uL (ref 4.0–10.5)
nRBC: 0 % (ref 0.0–0.2)

## 2022-07-03 MED ORDER — AMOXICILLIN-POT CLAVULANATE 875-125 MG PO TABS: 1.0000 | ORAL_TABLET | Freq: Two times a day (BID) | ORAL | 0 refills | Status: DC

## 2022-07-03 MED ORDER — NAPROXEN 500 MG PO TABS: 500.0000 mg | ORAL_TABLET | Freq: Two times a day (BID) | ORAL | 0 refills | Status: DC

## 2022-07-03 MED ORDER — NAPROXEN 500 MG PO TABS
500.0000 mg | ORAL_TABLET | Freq: Once | ORAL | Status: AC
  Administered 2022-07-03: 500 mg via ORAL
  Filled 2022-07-03: qty 1

## 2022-07-03 NOTE — ED Provider Triage Note (Signed)
Emergency Medicine Provider Triage Evaluation Note  Beth Jacobs , a 51 y.o. female  was evaluated in triage.  Pt complains of bilateral arm pain, left-sided low back pain, and general dental pain.  She states she was evaluated in the emergency department approximately 2 weeks ago for the arm pain in the dental pain and prescribed anti-inflammatory medications and antibiotics.  The prescriptions were sent when she was in [facility] and she was unable to get the prescriptions.  She states she has been in recovery from addiction.  Her new complaint today is the left-sided low back pain.  She denies dysuria, hematuria, abdominal pain.  No midline spine tenderness  Review of Systems  Positive: Bilateral elbow pain, dental pain, left-sided back/flank pain Negative: Chest pain, shortness of breath, abdominal pain  Physical Exam  BP 120/70   Pulse 94   Temp 98.4 F (36.9 C)   Resp 15   Ht '5\' 1"'$  (1.549 m)   Wt 77.1 kg   SpO2 99%   BMI 32.12 kg/m  Gen:   Awake, no distress   Resp:  Normal effort  MSK:   Moves extremities without difficulty  Other:    Medical Decision Making  Medically screening exam initiated at 2:30 PM.  Appropriate orders placed.  Beth Jacobs was informed that the remainder of the evaluation will be completed by another provider, this initial triage assessment does not replace that evaluation, and the importance of remaining in the ED until their evaluation is complete.     Dorothyann Peng, PA-C 07/03/22 1435

## 2022-07-03 NOTE — Discharge Instructions (Addendum)
You were seen today for bilateral arm pain, dental pain, and low back pain.  I have prescribed antibiotics for possible dental infection.  I also prescribed naproxen for anti-inflammatory medication. Please contact dentistry to follow up. If your back pain and arm pain don't improve, recommend evaluation by orthopedics.

## 2022-07-03 NOTE — ED Triage Notes (Signed)
Patient reports R arm pain and dental pain, reports she never took any of the medication prescribed to her, reports she is in recovery and [facility] picked it up but did not return it.   Denies urinary changes but has had L flank pain.

## 2022-07-03 NOTE — ED Provider Notes (Signed)
[facility] DEPT Provider Note   CSN: [id number] Arrival date & time: 07/03/22  1359     History  Chief Complaint  Patient presents with   Dental Pain   Arm Pain   Flank Pain    Beth Jacobs is a 51 y.o. female.  Patient presents to the hospital complaining of bilateral arm pain, left-sided flank pain, and dental pain.  The patient was evaluated at the same hospital in late July for the arm pain and dental pain and prescribed anti-inflammatory medication and antibiotics.  The patient was in a recovery center at that time and was unable to get her medication.  She has no new complaints with the dental pain or arm pain.  She does now complain of low back pain on the left side.  She states that she works at [employer] approximately 60 hours weekly and is on her feet throughout the day.  The patient denies shortness of breath, chest pain, dysuria, fever, hematuria.  Endorses bilateral arm pain, dental pain, and left-sided back pain.  Past medical history significant for cocaine abuse (in recovery), asthma, history of allergic reaction, bipolar 2 disorder HPI     Home Medications Prior to Admission medications   Medication Sig Start Date End Date Taking? Authorizing Provider  amoxicillin-clavulanate (AUGMENTIN) 875-125 MG tablet Take 1 tablet by mouth every 12 (twelve) hours. 07/03/22  Yes Dorothyann Peng, PA-C  naproxen (NAPROSYN) 500 MG tablet Take 1 tablet (500 mg total) by mouth 2 (two) times daily. 07/03/22  Yes Dorothyann Peng, PA-C  albuterol (PROVENTIL HFA;VENTOLIN HFA) 108 (90 Base) MCG/ACT inhaler Inhale 1-2 puffs into the lungs every 6 (six) hours as needed for wheezing or shortness of breath. 04/25/17   Virgel Manifold, MD  cetirizine (ZYRTEC ALLERGY) 10 MG tablet Take 1 tablet (10 mg total) by mouth daily for 7 days. 02/22/22 03/08/22  Luna Fuse, MD  citalopram (CELEXA) 20 MG tablet Take 1 tablet (20 mg total) by mouth at bedtime. 02/18/22   Mayers,  Cari S, PA-C  EPINEPHrine 0.3 mg/0.3 mL IJ SOAJ injection Inject 0.3 mg into the muscle as needed for anaphylaxis. 02/22/22   Luna Fuse, MD  lamoTRIgine (LAMICTAL) 25 MG tablet Take 2 tablets (50 mg total) by mouth daily. 02/18/22   Mayers, Cari S, PA-C  Multiple Vitamin (MULTIVITAMIN ADULT PO) Take 1 tablet by mouth daily.    [provider]  penicillin v potassium (VEETID) 500 MG tablet Take 1 tablet (500 mg total) by mouth 3 (three) times daily. 06/12/22   Domenic Moras, PA-C  Vitamin D, Ergocalciferol, (DRISDOL) 1.25 MG (50000 UNIT) CAPS capsule Take 1 capsule (50,000 Units total) by mouth every 7 (seven) days. Patient taking differently: Take 50,000 Units by mouth every 7 (seven) days. Fridays 02/19/22   Mayers, Cari S, PA-C      Allergies    Bee venom, Hydrocodone-acetaminophen, Latex, and Tomato    Review of Systems   Review of Systems  HENT:  Positive for dental problem.   Musculoskeletal:  Positive for arthralgias and back pain.    Physical Exam Updated Vital Signs BP 111/80 (BP Location: Left Arm)   Pulse 100   Temp 98.6 F (37 C) (Oral)   Resp 18   Ht '5\' 1"'$  (1.549 m)   Wt 77.1 kg   SpO2 99%   BMI 32.12 kg/m  Physical Exam Vitals and nursing note reviewed.  Constitutional:      General: She is not  in acute distress. HENT:     Head: Normocephalic and atraumatic.     Mouth/Throat:     Dentition: Abnormal dentition. Dental caries present. No gingival swelling or dental abscesses.  Cardiovascular:     Rate and Rhythm: Normal rate and regular rhythm.     Pulses: Normal pulses.     Heart sounds: Normal heart sounds.  Pulmonary:     Effort: Pulmonary effort is normal.  Abdominal:     Palpations: Abdomen is soft.     Tenderness: There is no abdominal tenderness.  Musculoskeletal:        General: Tenderness present. No swelling, deformity or signs of injury.     Cervical back: Normal range of motion and neck supple.     Comments: Normal radial, ulnar, median  nerve testing in both hands.  Normal grip strength and grossly normal sensation bilaterally  Tenderness to left lumbar region  Skin:    General: Skin is warm and dry.     Capillary Refill: Capillary refill takes less than 2 seconds.  Neurological:     General: No focal deficit present.     Mental Status: She is alert and oriented to person, place, and time.     ED Results / Procedures / Treatments   Labs (all labs ordered are listed, but only abnormal results are displayed) Labs Reviewed  BASIC METABOLIC PANEL - Abnormal; Notable for the following components:      Result Value   Glucose, Bld 103 (*)    All other components within normal limits  CBC WITH DIFFERENTIAL/PLATELET - Abnormal; Notable for the following components:   RBC 3.86 (*)    Hemoglobin 11.4 (*)    HCT 34.8 (*)    All other components within normal limits  URINALYSIS, ROUTINE W REFLEX MICROSCOPIC - Abnormal; Notable for the following components:   Leukocytes,Ua TRACE (*)    All other components within normal limits    EKG None  Radiology No results found.  Procedures Procedures    Medications Ordered in ED Medications  naproxen (NAPROSYN) tablet 500 mg (500 mg Oral Given 07/03/22 1752)    ED Course/ Medical Decision Making/ A&P                           Medical Decision Making Amount and/or Complexity of Data Reviewed Labs: ordered.   Patient here with multiple complaints.  The patient has poor dentition and appears to have an inflamed left upper gum with decay concerning for possible infection.  There are no signs of abscess at this time.  Plan on prescribing antibiotic and providing dental referral at this time.  The patient complains of atraumatic bilateral elbow pain.  She has mild tenderness around the elbow with normal flexion and extension.  DVT study was performed at the last visit which was negative.  Ultrasound was performed looking for any sign of abscess or fluid collection suggestive  of bursitis and then was noted at that visit as well.  Plan on discharging patient home on naproxen.  I did order the patient naproxen for her inflammation while here in the emergency room and the patient seemed to improve upon reassessment  I see no utility in imaging at this time as there was no trauma and DVT study was previously performed.  The patient does complain of left-sided low back pain.  There are no red flag symptoms such as urinary incontinence, retention, saddle anesthesia, or significant recent trauma.  No imaging performed for lumbar spine.  The patient's back pain seems to be muscular in nature at this time.  She has no midline tenderness.  The naproxen should also help with inflammation for this as well.  The patient is in recovery and wants no sort of narcotic medication which is perfectly reasonable.  Discharge home with naproxen and antibiotic for possible dental infection          Final Clinical Impression(s) / ED Diagnoses Final diagnoses:  Pain in both upper extremities  Dental infection  Acute left-sided low back pain without sciatica    Rx / DC Orders ED Discharge Orders          Ordered    naproxen (NAPROSYN) 500 MG tablet  2 times daily        07/03/22 1757    amoxicillin-clavulanate (AUGMENTIN) 875-125 MG tablet  Every 12 hours        07/03/22 1757              Ronny Bacon 07/03/22 Bray, Ankit, MD 07/04/22 1429

## 2022-08-03 ENCOUNTER — Ambulatory Visit: Payer: 59 | Admitting: Obstetrics and Gynecology

## 2022-08-11 ENCOUNTER — Encounter (HOSPITAL_COMMUNITY): Payer: Self-pay

## 2022-08-11 ENCOUNTER — Emergency Department (HOSPITAL_COMMUNITY)
Admission: EM | Admit: 2022-08-11 | Discharge: 2022-08-11 | Disposition: A | Discharge status: Home / Self Care | Payer: Medicaid Other | Attending: Emergency Medicine | Admitting: Emergency Medicine

## 2022-08-11 ENCOUNTER — Emergency Department (HOSPITAL_COMMUNITY): Payer: Medicaid Other

## 2022-08-11 ENCOUNTER — Other Ambulatory Visit (HOSPITAL_BASED_OUTPATIENT_CLINIC_OR_DEPARTMENT_OTHER): Payer: Self-pay

## 2022-08-11 DIAGNOSIS — Z91040 Latex allergy status: Secondary | ICD-10-CM | POA: Insufficient documentation

## 2022-08-11 DIAGNOSIS — J45909 Unspecified asthma, uncomplicated: Secondary | ICD-10-CM | POA: Insufficient documentation

## 2022-08-11 DIAGNOSIS — N898 Other specified noninflammatory disorders of vagina: Secondary | ICD-10-CM | POA: Insufficient documentation

## 2022-08-11 DIAGNOSIS — F1410 Cocaine abuse, uncomplicated: Secondary | ICD-10-CM | POA: Insufficient documentation

## 2022-08-11 DIAGNOSIS — R0789 Other chest pain: Secondary | ICD-10-CM | POA: Insufficient documentation

## 2022-08-11 LAB — CBC
HCT: 39.9 % (ref 36.0–46.0)
Hemoglobin: 12.7 g/dL (ref 12.0–15.0)
MCH: 28.7 pg (ref 26.0–34.0)
MCHC: 31.8 g/dL (ref 30.0–36.0)
MCV: 90.3 fL (ref 80.0–100.0)
Platelets: 403 10*3/uL — ABNORMAL HIGH (ref 150–400)
RBC: 4.42 MIL/uL (ref 3.87–5.11)
RDW: 13.5 % (ref 11.5–15.5)
WBC: 6.2 10*3/uL (ref 4.0–10.5)
nRBC: 0 % (ref 0.0–0.2)

## 2022-08-11 LAB — BASIC METABOLIC PANEL
Anion gap: 6 (ref 5–15)
BUN: 12 mg/dL (ref 6–20)
CO2: 27 mmol/L (ref 22–32)
Calcium: 9.2 mg/dL (ref 8.9–10.3)
Chloride: 112 mmol/L — ABNORMAL HIGH (ref 98–111)
Creatinine, Ser: 0.72 mg/dL (ref 0.44–1.00)
GFR, Estimated: >60 mL/min (ref >60)
Glucose, Bld: 93 mg/dL (ref 70–99)
Potassium: 3.8 mmol/L (ref 3.5–5.1)
Sodium: 145 mmol/L (ref 135–145)

## 2022-08-11 LAB — URINALYSIS, ROUTINE W REFLEX MICROSCOPIC
Bilirubin Urine: NEGATIVE
Glucose, UA: NEGATIVE mg/dL
Hgb urine dipstick: NEGATIVE
Ketones, ur: NEGATIVE mg/dL
Nitrite: NEGATIVE
Protein, ur: NEGATIVE mg/dL
Specific Gravity, Urine: 1.025 (ref 1.005–1.030)
pH: 5 (ref 5.0–8.0)

## 2022-08-11 LAB — TROPONIN I (HIGH SENSITIVITY): Troponin I (High Sensitivity): 2 ng/L (ref <18)

## 2022-08-11 LAB — PREGNANCY, URINE: Preg Test, Ur: NEGATIVE

## 2022-08-11 LAB — HIV ANTIBODY (ROUTINE TESTING W REFLEX): HIV Screen 4th Generation wRfx: NONREACTIVE

## 2022-08-11 MED ORDER — SUCRALFATE 1 G PO TABS
1.0000 g | ORAL_TABLET | Freq: Three times a day (TID) | ORAL | 0 refills | Status: DC
  Filled 2022-08-11: qty 28, 7d supply, fill #0

## 2022-08-11 MED ORDER — FAMOTIDINE IN NACL 20-0.9 MG/50ML-% IV SOLN
20.0000 mg | Freq: Once | INTRAVENOUS | Status: AC
  Administered 2022-08-11: 20 mg via INTRAVENOUS
  Filled 2022-08-11: qty 50

## 2022-08-11 MED ORDER — SUCRALFATE 1 GM/10ML PO SUSP
1.0000 g | Freq: Three times a day (TID) | ORAL | 0 refills | Status: DC
  Filled 2022-08-11: qty 420, 11d supply, fill #0

## 2022-08-11 MED ORDER — ALUM & MAG HYDROXIDE-SIMETH 200-200-20 MG/5ML PO SUSP
30.0000 mL | Freq: Once | ORAL | Status: AC
  Administered 2022-08-11: 30 mL via ORAL
  Filled 2022-08-11: qty 30

## 2022-08-11 MED ORDER — FAMOTIDINE 20 MG PO TABS
20.0000 mg | ORAL_TABLET | Freq: Two times a day (BID) | ORAL | 0 refills | Status: AC
  Filled 2022-08-11 – 2022-10-04 (×2): qty 30, 15d supply, fill #0

## 2022-08-11 MED ORDER — PANTOPRAZOLE SODIUM 20 MG PO TBEC
20.0000 mg | DELAYED_RELEASE_TABLET | Freq: Every day | ORAL | 0 refills | Status: DC
  Filled 2022-08-11: qty 30, 30d supply, fill #0

## 2022-08-11 NOTE — Discharge Instructions (Addendum)
At this time there does not appear to be the presence of an emergent medical condition, however there is always the potential for conditions to change. Please read and follow the below instructions.  Please return to the Emergency Department immediately for any new or worsening symptoms. Please be sure to follow up with your Primary Care Provider within one week regarding your visit today; please call their office to schedule an appointment even if you are feeling better for a follow-up visit. You have been prescribed Protonix, Pepcid and Carafate for possible acid reflux related symptoms.  You may follow-up with the specialist at [facility] gastroenterology for further evaluation possible acid reflux because of your symptoms. You have been referred to [facility] for further evaluation of chest pain.  You should receive a call from them in the next few days to schedule appointment.  If you do not hear from them by Friday please call their office to schedule an appointment. Please call your OB/GYN for evaluation of your vaginal discharge.  Your HIV and syphilis (RPR) test should result in the next 24 hours.  Please check your MyChart account for those results and discuss them with your primary care provider or OB/GYN.  Please avoid any sexual intercourse until you are cleared to do so by your OB/GYN or primary care provider.  Please read the additional information packets attached to your discharge summary.  Go to the nearest Emergency Department immediately if: You have fever or chills Your chest pain is worse. You have a cough that gets worse, or you cough up blood. You have very bad (severe) pain in your belly (abdomen). You pass out (faint). You have either of these for no clear reason: Sudden chest discomfort. Sudden discomfort in your arms, back, neck, or jaw. You have shortness of breath at any time. You suddenly start to sweat, or your skin gets clammy. You feel sick to your  stomach (nauseous). You throw up (vomit). You suddenly feel lightheaded or dizzy. You feel very weak or tired. Your heart starts to beat fast, or it feels like it is skipping beats. You have any new/concerning or worsening of symptoms.  Do not take your medicine if  develop an itchy rash, swelling in your mouth or lips, or difficulty breathing; call 911 and seek immediate emergency medical attention if this occurs.  You may review your lab tests and imaging results in their entirety on your MyChart account.  Please discuss all results of fully with your primary care provider and other specialist at your follow-up visit.  Note: Portions of this text may have been transcribed using voice recognition software. Every effort was made to ensure accuracy; however, inadvertent computerized transcription errors may still be present.

## 2022-08-11 NOTE — ED Provider Notes (Signed)
[facility] DEPT Provider Note   CSN: [id number] Arrival date & time: 08/11/22  1133     History  Chief Complaint  Patient presents with   Chest Pain   Vaginal Discharge    Beth Jacobs is a 51 y.o. female who reports a history of asthma, bipolar disorder, former cocaine abuse.  Patient reports she has been sober for 6 months.  Patient presented to the ER today for evaluation of 2 issues  Primary complaint: Chest pain onset yesterday after taking a Tylenol.  Patient reports a dull pressure-like sensation in the center of her chest which does not radiate it is mild, constant patient reports she went on a walk this morning and reports her pain was slightly worse.  She denies similar pain in the past.  Patient reports that her father has a history of a heart attack at age 39, denies family history of heart disease under the age of 60.  Patient denies history of smoking, CAD, hypertension, high cholesterol, diabetes or other chronic medical conditions.  Patient's second complaint today was vaginal discharge.  Patient reports a thin clear/Fiala vaginal discharge yesterday.  Patient reports that she has been going through menopause over the past 6 months, she reports every month she has some pelvic cramping consistent with her regular menstrual cycle.  She reports that this is the time for her menstrual cycle and she had some mild pelvic cramping yesterday which is why she took the Tylenol as above.  She reports no change to her normal menstrual cramping but did notice some thin vaginal discharge.  Patient reports that she is not sexually active, she denies any malodorous discharge, vaginal bleeding, dysuria/hematuria.  Patient denies fever, chills, shortness of breath, cough/hemoptysis, history of PE, extremity swelling/color change, exogenous hormone use, recent surgery/immobilization, recent travel, nausea, or vomiting, abdominal pain or any additional  concerns.  HPI     Home Medications Prior to Admission medications   Medication Sig Start Date End Date Taking? Authorizing Provider  famotidine (PEPCID) 20 MG tablet Take 1 tablet (20 mg total) by mouth 2 (two) times daily. 08/11/22  Yes Nuala Alpha A, PA-C  pantoprazole (PROTONIX) 20 MG tablet Take 1 tablet (20 mg total) by mouth daily. 08/11/22  Yes Nuala Alpha A, PA-C  sucralfate (CARAFATE) 1 GM/10ML suspension Take 10 mLs (1 g total) by mouth 4 (four) times daily -  with meals and at bedtime. 08/11/22  Yes Nuala Alpha A, PA-C  albuterol (PROVENTIL HFA;VENTOLIN HFA) 108 (90 Base) MCG/ACT inhaler Inhale 1-2 puffs into the lungs every 6 (six) hours as needed for wheezing or shortness of breath. 04/25/17   Virgel Manifold, MD  amoxicillin-clavulanate (AUGMENTIN) 875-125 MG tablet Take 1 tablet by mouth every 12 (twelve) hours. 07/03/22   Dorothyann Peng, PA-C  cetirizine (ZYRTEC ALLERGY) 10 MG tablet Take 1 tablet (10 mg total) by mouth daily for 7 days. 02/22/22 03/08/22  Luna Fuse, MD  citalopram (CELEXA) 20 MG tablet Take 1 tablet (20 mg total) by mouth at bedtime. 02/18/22   Mayers, Cari S, PA-C  EPINEPHrine 0.3 mg/0.3 mL IJ SOAJ injection Inject 0.3 mg into the muscle as needed for anaphylaxis. 02/22/22   Luna Fuse, MD  lamoTRIgine (LAMICTAL) 25 MG tablet Take 2 tablets (50 mg total) by mouth daily. 02/18/22   Mayers, Cari S, PA-C  Multiple Vitamin (MULTIVITAMIN ADULT PO) Take 1 tablet by mouth daily.    [provider]  naproxen (NAPROSYN) 500 MG  tablet Take 1 tablet (500 mg total) by mouth 2 (two) times daily. 07/03/22   Cherlynn June B, PA-C  penicillin v potassium (VEETID) 500 MG tablet Take 1 tablet (500 mg total) by mouth 3 (three) times daily. 06/12/22   Domenic Moras, PA-C  Vitamin D, Ergocalciferol, (DRISDOL) 1.25 MG (50000 UNIT) CAPS capsule Take 1 capsule (50,000 Units total) by mouth every 7 (seven) days. Patient taking differently: Take 50,000 Units by  mouth every 7 (seven) days. Fridays 02/19/22   Mayers, Cari S, PA-C      Allergies    Bee venom, Hydrocodone-acetaminophen, Latex, and Tomato    Review of Systems   Review of Systems Ten systems are reviewed and are negative for acute change except as noted in the HPI  Physical Exam Updated Vital Signs BP 119/66   Pulse 66   Temp 98 F (36.7 C) (Oral)   Resp 10   SpO2 100%  Physical Exam Constitutional:      General: She is not in acute distress.    Appearance: Normal appearance. She is well-developed. She is not ill-appearing or diaphoretic.  HENT:     Head: Normocephalic and atraumatic.  Eyes:     General: Vision grossly intact. Gaze aligned appropriately.     Pupils: Pupils are equal, round, and reactive to light.  Neck:     Trachea: Trachea and phonation normal.  Cardiovascular:     Rate and Rhythm: Normal rate and regular rhythm.     Pulses:          Dorsalis pedis pulses are 2+ on the right side and 2+ on the left side.     Heart sounds: Normal heart sounds.  Pulmonary:     Effort: Pulmonary effort is normal. No respiratory distress.  Abdominal:     General: There is no distension.     Palpations: Abdomen is soft.     Tenderness: There is no abdominal tenderness. There is no guarding or rebound.  Genitourinary:    Comments: Patient declined pelvic examination Musculoskeletal:        General: Normal range of motion.     Cervical back: Normal range of motion.     Right lower leg: No tenderness. No edema.     Left lower leg: No tenderness. No edema.  Skin:    General: Skin is warm and dry.  Neurological:     Mental Status: She is alert.     GCS: GCS eye subscore is 4. GCS verbal subscore is 5. GCS motor subscore is 6.     Comments: Speech is clear and goal oriented, follows commands Major Cranial nerves without deficit, no facial droop Moves extremities without ataxia, coordination intact  Psychiatric:        Behavior: Behavior normal.     ED Results /  Procedures / Treatments   Labs (all labs ordered are listed, but only abnormal results are displayed) Labs Reviewed  BASIC METABOLIC PANEL - Abnormal; Notable for the following components:      Result Value   Chloride 112 (*)    All other components within normal limits  CBC - Abnormal; Notable for the following components:   Platelets 403 (*)    All other components within normal limits  URINALYSIS, ROUTINE W REFLEX MICROSCOPIC - Abnormal; Notable for the following components:   Leukocytes,Ua MODERATE (*)    Bacteria, UA RARE (*)    All other components within normal limits  PREGNANCY, URINE  RPR  HIV ANTIBODY (ROUTINE  TESTING W REFLEX)  TROPONIN I (HIGH SENSITIVITY)    EKG EKG Interpretation  Date/Time:  Wednesday August 11 2022 11:46:25 EDT Ventricular Rate:  81 PR Interval:  156 QRS Duration: 91 QT Interval:  392 QTC Calculation: 455 R Axis:   -6 Text Interpretation: Sinus rhythm Probable left atrial enlargement No significant change since last tracing Confirmed by Deno Etienne [phone]) on 08/11/2022 2:52:09 PM  Radiology DG Chest 2 View  Result Date: 08/11/2022 CLINICAL DATA:  Chest pain. EXAM: CHEST - 2 VIEW COMPARISON:  Chest 06/03/2021 FINDINGS: The heart size and mediastinal contours are within normal limits. Both lungs are clear. The visualized skeletal structures are unremarkable. IMPRESSION: No active cardiopulmonary disease. Electronically Signed   By: Franchot Gallo M.D.   On: 08/11/2022 13:09    Procedures Procedures    Medications Ordered in ED Medications  alum & mag hydroxide-simeth (MAALOX/MYLANTA) 200-200-20 MG/5ML suspension 30 mL (30 mLs Oral Given 08/11/22 1316)  famotidine (PEPCID) IVPB 20 mg premix (0 mg Intravenous Stopped 08/11/22 1426)    ED Course/ Medical Decision Making/ A&P Clinical Course as of 08/11/22 1616  Wed Aug 11, 2022  1358 I discussed setting up the pelvic examination with the patient.  Patient declined, she plans to contact  her OB/GYN after she leaves today to set up an appointment. [BM]  1611 Pregnancy, urine Urine pregnancy test negative. [BM]  1611 Urinalysis, Routine w reflex microscopic Urine, Clean Catch(!) Urinalysis shows moderate leukocytes and rare bacteria.  0-5 WBCs.  Nitrite negative.  Doubt UTI. [BM]  1611 Troponin I (High Sensitivity) High-sensitivity troponin within normal limits.  In the setting of 1 day of chest pain no indication for delta troponin.  Low suspicion for ACS. [BM]  0932 Basic metabolic panel(!) BMP shows no emergent electrolyte derangement, AKI or gap. [BM]  1612 CBC(!) CBC shows no leukocytosis to suggest infectious process.  No anemia or thrombocytopenia. [BM]  1612 DG Chest 2 View I have personally reviewed and interpreted patient's two-view chest x-ray.  I do not appreciate any obvious PTX, PNA, effusion or other emergent cardiopulmonary process. [BM]  1612 EKG 12-Lead I personally reviewed and interpreted patient's twelve-lead EKG.  I do not appreciate any obvious acute ischemic changes.  Shows normal sinus rhythm. [BM]  3557 Patient was reassessed multiple times throughout this visit.  She received GI cocktail and Pepcid and reports resolution of her chest pain.   [BM]    Clinical Course User Index [BM] Gari Crown                           Medical Decision Making 51 year old female presented for evaluation of chest pain that began after she took some Tylenol yesterday.  She does report pain is somewhat worsened with ambulation.  She denies similar pain in the past.  Her risk factors include prior.  She is not a smoker, no other known risk factors.  Her father did have MI at age 8.  On evaluation patient is well-appearing and in no acute distress.  Differential includes but limited to ACS, PE, dissection pericarditis, PNA.  Cardiac work-up initiated.  Additionally patient reported of clear Agne vaginal discharge over the past 1 day.  She reports she is  currently going through menopause.  I offered to do pelvic examination today for evaluation, patient declined and instead chooses to follow-up with her OB/GYN for further evaluation.  She denies any chance of STI she has no malodorous  discharge, dysuria/hematuria and reports she is not sexually active.  She has a nontender abdomen on examination.  Overall low suspicion for PID.  I feel patient's request is reasonable she was informed of ER precautions regarding vaginal discharge.  Will obtain urinalysis and pregnancy test.  Amount and/or Complexity of Data Reviewed External Data Reviewed: notes. Labs: ordered. Decision-making details documented in ED Course. Radiology: ordered. Decision-making details documented in ED Course. ECG/medicine tests: ordered. Decision-making details documented in ED Course.  Risk OTC drugs. Prescription drug management. Risk Details: Patient's work-up today is overall reassuring.  Patient was provided with GI cocktail and Pepcid.  On reassessment she reports her chest pain is resolved.  Suspect she was experiencing an acid reflux because of her pain.  Low suspicion for ACS, PE, dissection or other emergent causes of her symptoms at this point.  She does have risk factors given her prior cocaine abuse so she has been referred to heart care for recheck.  I discussed strict ER precautions in regards to chest pain with the patient today.  Overall most suspicious for acid reflux as cause of her symptoms, on exam her abdomen is soft and nontender no indication for CT imaging at this point.  She has been prescribed Pepcid, Protonix and Carafate to help and she has been referred to on-call [facility] gastroenterology.  As the patient's vaginal discharge she declined evaluation today.  Soft nontender abdomen on exam she denies chance for STI reports she is not sexually active.  Urinalysis and pregnancy test are unremarkable.  She is given strict ER precautions in regards to vaginal  discharge, she plans to call her OB/GYN today to schedule follow-up appointment.     At this time there does not appear to be any evidence of an acute emergency medical condition and the patient appears stable for discharge with appropriate outpatient follow up. Diagnosis was discussed with patient who verbalizes understanding of care plan and is agreeable to discharge. I have discussed return precautions with patient who verbalizes understanding. Patient encouraged to follow-up with their PCP, gastroenterology, cardiology and OB/GYN. All questions answered.  Patient's case discussed with Dr. Tyrone Nine who agrees with plan to discharge with follow-up.   Note: Portions of this report may have been transcribed using voice recognition software. Every effort was made to ensure accuracy; however, inadvertent computerized transcription errors may still be present.         Final Clinical Impression(s) / ED Diagnoses Final diagnoses:  Atypical chest pain  Cocaine abuse ([place])    Rx / DC Orders ED Discharge Orders          Ordered    Ambulatory referral to Cardiology       Comments: If you have not heard from the Cardiology office within the next 72 hours please call [phone].   08/11/22 1615    pantoprazole (PROTONIX) 20 MG tablet  Daily        08/11/22 1615    famotidine (PEPCID) 20 MG tablet  2 times daily        08/11/22 1615    sucralfate (CARAFATE) 1 GM/10ML suspension  3 times daily with meals & bedtime        08/11/22 1615              Deliah Boston, PA-C 08/11/22 Collier, Dan, DO 08/13/22 1948

## 2022-08-11 NOTE — ED Triage Notes (Signed)
Pt arrived via POV, c/o Aday vaginal discharge, with irritation. Also c/o non reproducible chest pain that started last night. Worsening this morning while walking. Denies any SOB

## 2022-08-12 LAB — RPR
RPR Ser Ql: REACTIVE — AB
RPR Titer: 1:1 {titer}

## 2022-08-12 LAB — T.PALLIDUM AB, TOTAL: T Pallidum Abs: REACTIVE — AB

## 2022-08-20 ENCOUNTER — Other Ambulatory Visit (HOSPITAL_BASED_OUTPATIENT_CLINIC_OR_DEPARTMENT_OTHER): Payer: Self-pay

## 2022-09-21 NOTE — Progress Notes (Unsigned)
Patient ID: Beth Jacobs, female    DOB: [birth year]  MRN: [id number]  CC: Emergency Department Follow-Up  Subjective: Beth Maron is a 51 y.o. female who presents for emergency department follow-up.   Her concerns today include:  08/11/2022 [facility] Emergency Department per DO note: ED Course/ Medical Decision Making/ A&P    Clinical Course as of 08/11/22 1616  Wed Aug 11, 2022  1358 I discussed setting up the pelvic examination with the patient.  Patient declined, she plans to contact her OB/GYN after she leaves today to set up an appointment. [BM]  1611 Pregnancy, urine Urine pregnancy test negative. [BM]  1611 Urinalysis, Routine w reflex microscopic Urine, Clean Catch(!) Urinalysis shows moderate leukocytes and rare bacteria.  0-5 WBCs.  Nitrite negative.  Doubt UTI. [BM]  1611 Troponin I (High Sensitivity) High-sensitivity troponin within normal limits.  In the setting of 1 day of chest pain no indication for delta troponin.  Low suspicion for ACS. [BM]  9702 Basic metabolic panel(!) BMP shows no emergent electrolyte derangement, AKI or gap. [BM]  1612 CBC(!) CBC shows no leukocytosis to suggest infectious process.  No anemia or thrombocytopenia. [BM]  1612 DG Chest 2 View I have personally reviewed and interpreted patient's two-view chest x-ray.  I do not appreciate any obvious PTX, PNA, effusion or other emergent cardiopulmonary process. [BM]  1612 EKG 12-Lead I personally reviewed and interpreted patient's twelve-lead EKG.  I do not appreciate any obvious acute ischemic changes.  Shows normal sinus rhythm. [BM]  6378 Patient was reassessed multiple times throughout this visit.  She received GI cocktail and Pepcid and reports resolution of her chest pain.   [BM]     Clinical Course User Index [BM] Gari Crown                            Medical Decision Making 51 year old female presented for evaluation of chest pain that began after she took some  Tylenol yesterday.  She does report pain is somewhat worsened with ambulation.  She denies similar pain in the past.  Her risk factors include prior.  She is not a smoker, no other known risk factors.  Her father did have MI at age 29.  On evaluation patient is well-appearing and in no acute distress.  Differential includes but limited to ACS, PE, dissection pericarditis, PNA.  Cardiac work-up initiated.   Additionally patient reported of clear [place] vaginal discharge over the past 1 day.  She reports she is currently going through menopause.  I offered to do pelvic examination today for evaluation, patient declined and instead chooses to follow-up with her OB/GYN for further evaluation.  She denies any chance of STI she has no malodorous discharge, dysuria/hematuria and reports she is not sexually active.  She has a nontender abdomen on examination.  Overall low suspicion for PID.  I feel patient's request is reasonable she was informed of ER precautions regarding vaginal discharge.  Will obtain urinalysis and pregnancy test.   Amount and/or Complexity of Data Reviewed External Data Reviewed: notes. Labs: ordered. Decision-making details documented in ED Course. Radiology: ordered. Decision-making details documented in ED Course. ECG/medicine tests: ordered. Decision-making details documented in ED Course.   Risk OTC drugs. Prescription drug management. Risk Details: Patient's work-up today is overall reassuring.  Patient was provided with GI cocktail and Pepcid.  On reassessment she reports her chest pain is resolved.  Suspect she was  experiencing an acid reflux because of her pain.  Low suspicion for ACS, PE, dissection or other emergent causes of her symptoms at this point.  She does have risk factors given her prior cocaine abuse so she has been referred to heart care for recheck.  I discussed strict ER precautions in regards to chest pain with the patient today.  Overall most suspicious for acid  reflux as cause of her symptoms, on exam her abdomen is soft and nontender no indication for CT imaging at this point.  She has been prescribed Pepcid, Protonix and Carafate to help and she has been referred to on-call [facility] gastroenterology.   As the patient's vaginal discharge she declined evaluation today.  Soft nontender abdomen on exam she denies chance for STI reports she is not sexually active.  Urinalysis and pregnancy test are unremarkable.  She is given strict ER precautions in regards to vaginal discharge, she plans to call her OB/GYN today to schedule follow-up appointment.   At this time there does not appear to be any evidence of an acute emergency medical condition and the patient appears stable for discharge with appropriate outpatient follow up. Diagnosis was discussed with patient who verbalizes understanding of care plan and is agreeable to discharge. I have discussed return precautions with patient who verbalizes understanding. Patient encouraged to follow-up with their PCP, gastroenterology, cardiology and OB/GYN. All questions answered.   Patient's case discussed with Dr. Tyrone Nine who agrees with plan to discharge with follow-up.   Follow-Ups Call Gastroenterology, [place] Follow up with [place] DEPT (Emergency Medicine); Return as needed for new or worsening symptoms Follow up with [place]  Today's visit 09/22/2022: - Acid reflux medications helped. Has not established with gastroenterologist. - No chest pain and additional red flag symptoms. Has not established with cardiologist.  - Reports since being discharged from rehab she has not used substances for 8 months.  - Reports she was seen by her gynecologist at [facility] and told her symptoms were related to ovulation. States 4 days after seeing gynecologist her menses began. - Requesting refills of Citalopram and Hydroxyzine. States she does not  like how Lamictal makes her feel (causes teeth grinding) and does not want medication refilled on today. Reports she has tried several medications such as Abilify and they all have side effects.       09/22/2022    1:45 PM 03/08/2022    9:05 AM 02/18/2022    9:03 AM 06/29/2021   10:14 AM  Depression screen PHQ 2/9  Decreased Interest 0 '3 3 3  '$ Down, Depressed, Hopeless 0 '2 2 3  '$ PHQ - 2 Score 0 '5 5 6  '$ Altered sleeping  '3 3 3  '$ Tired, decreased energy  0 3 3  Change in appetite  '3 3 3  '$ Feeling bad or failure about yourself   0 2 3  Trouble concentrating  '2 3 3  '$ Moving slowly or fidgety/restless  '2 3 3  '$ Suicidal thoughts  0 2 3  PHQ-9 Score  '15 24 27     '$ Patient Active Problem List   Diagnosis Date Noted   Vitamin D deficiency 02/19/2022   Bipolar 2 disorder ([place]) 06/29/2021   Generalized anxiety disorder 06/29/2021   PTSD (post-traumatic stress disorder) 06/29/2021   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     albuterol (PROVENTIL HFA;VENTOLIN HFA) 108 (90 Base) MCG/ACT inhaler Inhale 1-2 puffs into the lungs every 6 (six) hours as needed for wheezing or shortness of breath. 1 Inhaler 0   cetirizine (ZYRTEC ALLERGY) 10 MG tablet Take 1 tablet (10 mg total) by mouth daily for 7 days. 7 tablet 0   EPINEPHrine 0.3 mg/0.3 mL IJ SOAJ injection Inject 0.3 mg into the muscle as needed for anaphylaxis. 2 each 0   famotidine (PEPCID) 20 MG tablet Take 1 tablet (20  mg total) by mouth 2 (two) times daily. 30 tablet 0   lamoTRIgine (LAMICTAL) 25 MG tablet Take 2 tablets (50 mg total) by mouth daily. 60 tablet 1   Multiple Vitamin (MULTIVITAMIN ADULT PO) Take 1 tablet by mouth daily.     Vitamin D, Ergocalciferol, (DRISDOL) 1.25 MG (50000 UNIT) CAPS capsule Take 1 capsule (50,000 Units total) by mouth every 7 (seven) days. (Patient taking differently: Take 50,000 Units by mouth every 7 (seven) days. Fridays) 4 capsule 2   No current facility-administered medications on file prior to visit.    Allergies  Allergen Reactions   Bee Venom Anaphylaxis    "Affects oxygen level"   Latex Hives   Tomato Swelling   Hydrocodone-Acetaminophen Nausea And Vomiting    Patient can take tylenol products and oxycodone , its only hydrocodone that causes issues    Social History   Socioeconomic History   Marital status: Legally Separated    Spouse name: Not on file   Number of children: Not on file   Years of education: Not on file   Highest education level: Not on file  Occupational History   Not on file  Tobacco Use   Smoking status: Never    Passive exposure: Never   Smokeless tobacco: Never  Vaping Use   Vaping Use: Never used  Substance and Sexual Activity   Alcohol use: No   Drug use: Not Currently    Types: Cocaine   Sexual activity: Yes    Partners: Male    Birth control/protection: None  Other Topics Concern   Not on file  Social History Narrative   Not on file   Social Determinants of Health   Financial Resource Strain: Not on file  Food Insecurity: Not on file  Transportation Needs: Not on file  Physical Activity: Not on file  Stress: Not on file  Social Connections: Not on file  Intimate Partner Violence: Not on file    Family History  Problem Relation Age of Onset   Cerebral palsy Son        born at [redacted] weeks gestation   Cervical cancer Mother    Other Neg Hx     Past Surgical History:  Procedure Laterality Date   DILATION  AND EVACUATION     NO PAST SURGERIES     WISDOM TOOTH EXTRACTION      ROS: Review of Systems Negative except as stated above  PHYSICAL EXAM: BP 114/74 (BP Location: Left Arm, Patient Position: Sitting, Cuff Size: Large)   Pulse 86   Temp  98.3 F (36.8 C)   Resp 16   Ht 5' 1.02" (1.55 m)   Wt 198 lb (89.8 kg)   SpO2 97%   BMI 37.38 kg/m   Physical Exam HENT:     Head: Normocephalic and atraumatic.  Eyes:     Extraocular Movements: Extraocular movements intact.     Conjunctiva/sclera: Conjunctivae normal.     Pupils: Pupils are equal, round, and reactive to light.  Cardiovascular:     Rate and Rhythm: Normal rate and regular rhythm.     Pulses: Normal pulses.     Heart sounds: Normal heart sounds.  Pulmonary:     Effort: Pulmonary effort is normal.     Breath sounds: Normal breath sounds.  Musculoskeletal:     Cervical back: Normal range of motion and neck supple.  Neurological:     General: No focal deficit present.     Mental Status: She is alert and oriented to person, place, and time.  Psychiatric:        Mood and Affect: Mood normal.        Behavior: Behavior normal.     ASSESSMENT AND PLAN: 1. Atypical chest pain 2. Cocaine abuse ([place]) - Patient today in office without cardiopulmonary distress.  - Patient reports since she was discharged from rehab she has not used substances in 8 months. - Referral to Cardiology for further evaluation and management.  - Ambulatory referral to Cardiology  3. Gastroesophageal reflux disease, unspecified whether esophagitis present - Continue Pantoprazole as prescribed.  - Referral to Gastroenterology for further evaluation and management.  - pantoprazole (PROTONIX) 20 MG tablet; Take 1 tablet (20 mg total) by mouth daily.  Dispense: 30 tablet; Refill: 2 - Ambulatory referral to Gastroenterology  4. Bipolar 2 disorder ([place]) - Patient denies thoughts of self-harm, suicidal ideations, homicidal ideations. - Continue  Citalopram and Hydroxyzine as prescribed.  - Referral to Psychiatry for further evaluation and management. - hydrOXYzine (ATARAX) 50 MG tablet; Take 1 tablet (50 mg total) by mouth 2 (two) times daily as needed.  Dispense: 30 tablet; Refill: 2 - Ambulatory referral to Psychiatry - citalopram (CELEXA) 20 MG tablet; Take 1 tablet (20 mg total) by mouth at bedtime.  Dispense: 30 tablet; Refill: 2  5. Financial difficulties - Offered patient [place] financial discount/orange card. Discussed patient will need to mail in completed application for processing and schedule appointment with financial counselor. Patient verbalized understanding.   Patient was given the opportunity to ask questions.  Patient verbalized understanding of the plan and was able to repeat key elements of the plan. Patient was given clear instructions to go to Emergency Department or return to medical center if symptoms don't improve, worsen, or new problems develop.The patient verbalized understanding.   Orders Placed This Encounter  Procedures   Ambulatory referral to Psychiatry   Ambulatory referral to Cardiology   Ambulatory referral to Gastroenterology     Requested Prescriptions   Signed Prescriptions Disp Refills   hydrOXYzine (ATARAX) 50 MG tablet 30 tablet 2    Sig: Take 1 tablet (50 mg total) by mouth 2 (two) times daily as needed.   pantoprazole (PROTONIX) 20 MG tablet 30 tablet 2    Sig: Take 1 tablet (20 mg total) by mouth daily.   citalopram (CELEXA) 20 MG tablet 30 tablet 2    Sig: Take 1 tablet (20 mg total) by mouth at bedtime.    Return for Physical per patient preference; Give CAFA.  Camillia Herter, NP

## 2022-09-22 ENCOUNTER — Other Ambulatory Visit: Payer: Self-pay

## 2022-09-22 ENCOUNTER — Ambulatory Visit (INDEPENDENT_AMBULATORY_CARE_PROVIDER_SITE_OTHER): Payer: Commercial Managed Care - HMO | Admitting: Family

## 2022-09-22 ENCOUNTER — Encounter: Payer: Self-pay | Admitting: Family

## 2022-09-22 VITALS — BP 114/74 | HR 86 | Temp 98.3°F | Resp 16 | Ht 61.02 in | Wt 198.0 lb

## 2022-09-22 DIAGNOSIS — R0789 Other chest pain: Secondary | ICD-10-CM

## 2022-09-22 DIAGNOSIS — F3181 Bipolar II disorder: Secondary | ICD-10-CM | POA: Diagnosis not present

## 2022-09-22 DIAGNOSIS — F1410 Cocaine abuse, uncomplicated: Secondary | ICD-10-CM | POA: Diagnosis not present

## 2022-09-22 DIAGNOSIS — K219 Gastro-esophageal reflux disease without esophagitis: Secondary | ICD-10-CM | POA: Diagnosis not present

## 2022-09-22 DIAGNOSIS — Z599 Problem related to housing and economic circumstances, unspecified: Secondary | ICD-10-CM

## 2022-09-22 MED ORDER — CITALOPRAM HYDROBROMIDE 20 MG PO TABS
20.0000 mg | ORAL_TABLET | Freq: Every day | ORAL | 2 refills | Status: AC
  Filled 2022-09-22 – 2022-10-04 (×2): qty 30, 30d supply, fill #0

## 2022-09-22 MED ORDER — PANTOPRAZOLE SODIUM 20 MG PO TBEC
20.0000 mg | DELAYED_RELEASE_TABLET | Freq: Every day | ORAL | 2 refills | Status: AC
  Filled 2022-09-22 – 2022-10-04 (×2): qty 30, 30d supply, fill #0

## 2022-09-22 MED ORDER — HYDROXYZINE HCL 50 MG PO TABS
50.0000 mg | ORAL_TABLET | Freq: Two times a day (BID) | ORAL | 2 refills | Status: AC | PRN
  Filled 2022-09-22 – 2022-10-04 (×2): qty 30, 15d supply, fill #0

## 2022-09-22 NOTE — Progress Notes (Signed)
Pt presents for medication management(self-pay)  -needs to discuss Lamictal that [facility] started her on -needs refills on Hydroxyzine(unsure what dosage should be taking) and pantoprazole

## 2022-09-29 ENCOUNTER — Other Ambulatory Visit: Payer: Self-pay

## 2022-10-01 ENCOUNTER — Encounter (HOSPITAL_COMMUNITY): Payer: Self-pay | Admitting: Emergency Medicine

## 2022-10-01 ENCOUNTER — Other Ambulatory Visit: Payer: Self-pay

## 2022-10-01 ENCOUNTER — Emergency Department (HOSPITAL_COMMUNITY)
Admission: EM | Admit: 2022-10-01 | Discharge: 2022-10-02 | Discharge status: Against Medical Advice | Payer: Commercial Managed Care - HMO | Attending: Emergency Medicine | Admitting: Emergency Medicine

## 2022-10-01 DIAGNOSIS — Z5321 Procedure and treatment not carried out due to patient leaving prior to being seen by health care provider: Secondary | ICD-10-CM | POA: Insufficient documentation

## 2022-10-01 DIAGNOSIS — R222 Localized swelling, mass and lump, trunk: Secondary | ICD-10-CM | POA: Insufficient documentation

## 2022-10-01 DIAGNOSIS — J029 Acute pharyngitis, unspecified: Secondary | ICD-10-CM | POA: Diagnosis present

## 2022-10-01 DIAGNOSIS — R2233 Localized swelling, mass and lump, upper limb, bilateral: Secondary | ICD-10-CM | POA: Insufficient documentation

## 2022-10-01 DIAGNOSIS — Z20822 Contact with and (suspected) exposure to covid-19: Secondary | ICD-10-CM | POA: Insufficient documentation

## 2022-10-01 LAB — BASIC METABOLIC PANEL
Anion gap: 6 (ref 5–15)
BUN: 11 mg/dL (ref 6–20)
CO2: 26 mmol/L (ref 22–32)
Calcium: 9 mg/dL (ref 8.9–10.3)
Chloride: 108 mmol/L (ref 98–111)
Creatinine, Ser: 0.74 mg/dL (ref 0.44–1.00)
GFR, Estimated: >60 mL/min (ref >60)
Glucose, Bld: 110 mg/dL — ABNORMAL HIGH (ref 70–99)
Potassium: 3.6 mmol/L (ref 3.5–5.1)
Sodium: 140 mmol/L (ref 135–145)

## 2022-10-01 LAB — CBC
HCT: 35.6 % — ABNORMAL LOW (ref 36.0–46.0)
Hemoglobin: 11.1 g/dL — ABNORMAL LOW (ref 12.0–15.0)
MCH: 28.3 pg (ref 26.0–34.0)
MCHC: 31.2 g/dL (ref 30.0–36.0)
MCV: 90.8 fL (ref 80.0–100.0)
Platelets: 471 10*3/uL — ABNORMAL HIGH (ref 150–400)
RBC: 3.92 MIL/uL (ref 3.87–5.11)
RDW: 13 % (ref 11.5–15.5)
WBC: 5.5 10*3/uL (ref 4.0–10.5)
nRBC: 0 % (ref 0.0–0.2)

## 2022-10-01 LAB — RESP PANEL BY RT-PCR (FLU A&B, COVID) ARPGX2
Influenza A by PCR: NEGATIVE
Influenza B by PCR: NEGATIVE
SARS Coronavirus 2 by RT PCR: NEGATIVE

## 2022-10-01 NOTE — ED Provider Triage Note (Signed)
Emergency Medicine Provider Triage Evaluation Note  Beth Jacobs , a 51 y.o. female  was evaluated in triage.  Pt complains of sore throat, cough, fever at home.  Patient reports that the symptoms began yesterday.  Patient denies any known sick contacts.  Patient denies any nausea or vomiting or diarrhea.  Patient also reports that she has "bumps" on her bilateral hands, trunk.  Patient denies that these "bumps" itch, burn, sting or drain.  The patient states that they just popped up today.  Patient denies any known environmental exposures to allergens.  Patient denies any shortness of breath, trouble swallowing.  Review of Systems  Positive:  Negative:   Physical Exam  BP 138/74 (BP Location: Right Arm)   Pulse 88   Temp 98.4 F (36.9 C) (Oral)   Resp 20   Ht '5\' 1"'$  (1.549 m)   Wt 90.7 kg   SpO2 100%   BMI 37.79 kg/m  Gen:   Awake, no distress   Resp:  Normal effort  MSK:   Moves extremities without difficulty  Other:    Medical Decision Making  Medically screening exam initiated at 9:11 PM.  Appropriate orders placed.  Beth C Ganus was informed that the remainder of the evaluation will be completed by another provider, this initial triage assessment does not replace that evaluation, and the importance of remaining in the ED until their evaluation is complete.     Azucena Cecil, PA-C 10/01/22 2112

## 2022-10-01 NOTE — ED Triage Notes (Signed)
Pt c/o sore throat and bumps on her hands and stomach. States she had a temp of 99.9 at home and took ibuprofen

## 2022-10-04 ENCOUNTER — Other Ambulatory Visit: Payer: Self-pay

## 2022-10-05 ENCOUNTER — Emergency Department (HOSPITAL_COMMUNITY)
Admission: EM | Admit: 2022-10-05 | Discharge: 2022-10-06 | Discharge status: Against Medical Advice | Payer: Commercial Managed Care - HMO | Attending: Student | Admitting: Student

## 2022-10-05 ENCOUNTER — Encounter (HOSPITAL_COMMUNITY): Payer: Self-pay | Admitting: Emergency Medicine

## 2022-10-05 ENCOUNTER — Other Ambulatory Visit: Payer: Self-pay

## 2022-10-05 DIAGNOSIS — Z5321 Procedure and treatment not carried out due to patient leaving prior to being seen by health care provider: Secondary | ICD-10-CM | POA: Diagnosis not present

## 2022-10-05 DIAGNOSIS — R2233 Localized swelling, mass and lump, upper limb, bilateral: Secondary | ICD-10-CM | POA: Diagnosis not present

## 2022-10-05 DIAGNOSIS — R509 Fever, unspecified: Secondary | ICD-10-CM | POA: Insufficient documentation

## 2022-10-05 DIAGNOSIS — J029 Acute pharyngitis, unspecified: Secondary | ICD-10-CM | POA: Diagnosis present

## 2022-10-05 LAB — GROUP A STREP BY PCR: Group A Strep by PCR: NOT DETECTED

## 2022-10-05 NOTE — ED Triage Notes (Signed)
Pt c/o continued sore throat since Friday. Pt states that the bumps on her hands are going away, but the bottom of her feet are sore.

## 2022-10-05 NOTE — ED Provider Triage Note (Signed)
Emergency Medicine Provider Triage Evaluation Note  Beth Jacobs , a 51 y.o. female  was evaluated in triage.  Pt complains of sore throat and fever.  Patient also has bumps to hands and feet.  Patient has not been around young children.  Admits to fever over the weekend.  COVID/influenza negative on 11/10. No new medications or products.  Review of Systems  Positive: Rash,f ever, sore throat Negative: CP  Physical Exam  BP 128/82 (BP Location: Left Arm)   Pulse (!) 104   Temp 99.8 F (37.7 C) (Oral)   Resp 18   SpO2 96%  Gen:   Awake, no distress   Resp:  Normal effort  MSK:   Moves extremities without difficulty  Other:    Medical Decision Making  Medically screening exam initiated at 7:45 PM.  Appropriate orders placed.  Beth Jacobs was informed that the remainder of the evaluation will be completed by another provider, this initial triage assessment does not replace that evaluation, and the importance of remaining in the ED until their evaluation is complete.  Strep test HFM?   Suzy Bouchard, [place] 10/05/22 1946

## 2022-10-11 ENCOUNTER — Telehealth: Payer: Self-pay

## 2022-10-12 ENCOUNTER — Ambulatory Visit (INDEPENDENT_AMBULATORY_CARE_PROVIDER_SITE_OTHER): Payer: Commercial Managed Care - HMO | Admitting: Obstetrics and Gynecology

## 2022-10-12 ENCOUNTER — Encounter: Payer: Self-pay | Admitting: Obstetrics and Gynecology

## 2022-10-12 ENCOUNTER — Other Ambulatory Visit (HOSPITAL_COMMUNITY)
Admission: RE | Admit: 2022-10-12 | Discharge: 2022-10-12 | Disposition: A | Discharge status: Home / Self Care | Payer: Commercial Managed Care - HMO | Source: Ambulatory Visit | Attending: Obstetrics and Gynecology | Admitting: Obstetrics and Gynecology

## 2022-10-12 VITALS — BP 106/73 | HR 87 | Ht 61.0 in | Wt 203.0 lb

## 2022-10-12 DIAGNOSIS — Z6838 Body mass index (BMI) 38.0-38.9, adult: Secondary | ICD-10-CM | POA: Insufficient documentation

## 2022-10-12 DIAGNOSIS — Z01419 Encounter for gynecological examination (general) (routine) without abnormal findings: Secondary | ICD-10-CM | POA: Insufficient documentation

## 2022-10-12 DIAGNOSIS — A530 Latent syphilis, unspecified as early or late: Secondary | ICD-10-CM | POA: Insufficient documentation

## 2022-10-12 DIAGNOSIS — Z1231 Encounter for screening mammogram for malignant neoplasm of breast: Secondary | ICD-10-CM | POA: Diagnosis not present

## 2022-10-12 DIAGNOSIS — N926 Irregular menstruation, unspecified: Secondary | ICD-10-CM

## 2022-10-12 DIAGNOSIS — Z113 Encounter for screening for infections with a predominantly sexual mode of transmission: Secondary | ICD-10-CM | POA: Diagnosis present

## 2022-10-12 NOTE — Progress Notes (Signed)
GYNECOLOGY ANNUAL PREVENTATIVE CARE ENCOUNTER NOTE  History:     Beth Jacobs is a 51 y.o. [phone] female here for a routine annual gynecologic exam.  Current complaints: rash on soles of hands and feet.   Denies, discharge, pelvic pain, problems with intercourse or other gynecologic concerns.   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  Provider attempted to alert pt, but her voicemail is full.  She had not followed up with the health department for treatment.  Pt was unaware of positive result.  She also notes recent hx of a rash on soles of hands and feet as well as her abdomen which she says are resolving.   Gynecologic History No LMP recorded. (Menstrual status: Irregular Periods). Contraception: none Last Pap: 11/19. Results were: negative with positive trich, negative HPV Last mammogram: never  Obstetric History OB History  Gravida Para Term Preterm AB Living  '6 4 3 1 2 4  '$ SAB IAB Ectopic Multiple Live Births  '1 1     4    '$ # Outcome Date GA Lbr Len/2nd Weight Sex Delivery Anes PTL Lv  6 Term 11/26/12 82w2d13:31 / 00:23 7 lb 15.2 oz (3.606 kg) F Vag-Spont EPI  LIV  5 Term 2002    M Vag-Spont   LIV  4 Preterm 2000 245w0d M Vag-Spont   LIV  3 Term 1991    F Vag-Spont   LIV  2 SAB  2089w0d    FD  1 IAB             Past Medical History:  Diagnosis Date   Anxiety    Asthma    C[place]aine abuse ([place]  History of allergic reaction 02/26/2013   Mental disorder    Preterm labor    PTSD (post-traumatic stress disorder)    Torn Achilles tendon     Past Surgical History:  Procedure Laterality Date   DILATION AND EVACUATION     NO PAST SURGERIES     WISDOM TOOTH EXTRACTION      Current Outpatient Medications on File Prior to Visit  Medication Sig Dispense Refill   albuterol (PROVENTIL HFA;VENTOLIN HFA) 108 (90 Base) MCG/ACT inhaler Inhale 1-2 puffs into the lungs every 6 (six) hours as needed for wheezing or shortness of breath. 1  Inhaler 0   ARIPiprazole (ABILIFY) 10 MG tablet Take 10 mg by mouth daily.     citalopram (CELEXA) 20 MG tablet Take 1 tablet (20 mg total) by mouth at bedtime. 30 tablet 2   EPINEPHrine 0.3 mg/0.3 mL IJ SOAJ injection Inject 0.3 mg into the muscle as needed for anaphylaxis. 2 each 0   famotidine (PEPCID) 20 MG tablet Take 1 tablet (20 mg total) by mouth 2 (two) times daily. 30 tablet 0   ferrous sulfate 325 (65 FE) MG EC tablet Take 325 mg by mouth 3 (three) times daily with meals.     hydrOXYzine (ATARAX) 50 MG tablet Take 1 tablet (50 mg total) by mouth 2 (two) times daily as needed. 30 tablet 2   lamoTRIgine (LAMICTAL) 25 MG tablet Take 2 tablets (50 mg total) by mouth daily. 60 tablet 1   Multiple Vitamin (MULTIVITAMIN ADULT PO) Take 1 tablet by mouth daily.     pantoprazole (PROTONIX) 20 MG tablet Take 1 tablet (20 mg total) by mouth daily. 30 tablet 2   Vitamin D, Ergocalciferol, (DRISDOL) 1.25 MG (50000 UNIT) CAPS capsule  Take 1 capsule (50,000 Units total) by mouth every 7 (seven) days. (Patient taking differently: Take 50,000 Units by mouth every 7 (seven) days. Fridays) 4 capsule 2   cetirizine (ZYRTEC ALLERGY) 10 MG tablet Take 1 tablet (10 mg total) by mouth daily for 7 days. 7 tablet 0   No current facility-administered medications on file prior to visit.    Allergies  Allergen Reactions   Bee Venom Anaphylaxis    "Affects oxygen level"   Latex Hives   Tomato Swelling   Hydrocodone-Acetaminophen Nausea And Vomiting    Patient can take tylenol products and oxycodone , its only hydrocodone that causes issues    Social History:  reports that she has never smoked. She has never been exposed to tobacco smoke. She has never used smokeless tobacco. She reports that she does not currently use drugs after having used the following drugs: Cocaine. She reports that she does not drink alcohol.  Family History  Problem Relation Age of Onset   Cancer Mother    Cervical cancer Mother     Cerebral palsy Son        born at [redacted] weeks gestation   Other Neg Hx     The following portions of the patient's history were reviewed and updated as appropriate: allergies, current medications, past family history, past medical history, past social history, past surgical history and problem list.  Review of Systems Pertinent items noted in HPI and remainder of comprehensive ROS otherwise negative.  Physical Exam:  BP 106/73   Pulse 87   Ht '5\' 1"'$  (1.549 m)   Wt 203 lb (92.1 kg)   BMI 38.36 kg/m  CONSTITUTIONAL: Well-developed, well-nourished female in no acute distress.  HENT:  Normocephalic, atraumatic, External right and left ear normal. Oropharynx is clear and moist EYES: Conjunctivae and EOM are normal.  NECK: Normal range of motion, supple, no masses.  Normal thyroid.  SKIN: Skin is warm and dry. No rash noted. Not diaphoretic. No erythema. No pallor. Healing scaly rash to abdomen and hands and feet MUSCULOSKELETAL: Normal range of motion. No tenderness.  No cyanosis, clubbing, or edema.  2+ distal pulses. NEUROLOGIC: Alert and oriented to person, place, and time. Normal reflexes, muscle tone coordination.  PSYCHIATRIC: Normal mood and affect. Normal behavior. Normal judgment and thought content. CARDIOVASCULAR: Normal heart rate noted, regular rhythm RESPIRATORY: Clear to auscultation bilaterally. Effort and breath sounds normal, no problems with respiration noted. BREASTS: Symmetric in size. No masses, tenderness, skin changes, nipple drainage, or lymphadenopathy bilaterally. Performed in the presence of a chaperone. ABDOMEN: Soft, no distention noted.  No tenderness, rebound or guarding.  PELVIC: Normal appearing external genitalia and urethral meatus; normal appearing vaginal mucosa and cervix.  No abnormal discharge noted.  Pap smear obtained.  Vaginal swab noted.  Normal uterine size, no other palpable masses, no uterine or adnexal tenderness.  Performed in the presence of a  chaperone.   Assessment and Plan:    1. Women's annual routine gynecological examination Normal annual exam, pt likely transitioning into menopause with irregular cycle  - Cytology - PAP( [place]) - MM 3D SCREEN BREAST BILATERAL; Future  2. Routine screening for STI (sexually transmitted infection) Pt had negative Hep C 3/23.  Pt declined Hep B testing.  Recent HIV testing was negative - Cervicovaginal ancillary only( [place])  3. Encounter for screening mammogram for malignant neoplasm of breast  - MM 3D SCREEN BREAST BILATERAL; Future  4. Irregular menses Menses 6 months ago, recent  menses about 3 weeks ago.  Continue to monitor for 12 month amenorrhea  5. Morbid obesity ([place])   6. BMI 38.0-38.9,adult   7. Positive RPR test Also positive treponemal testing for confirmation.  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  Pt advised to follow up with health department for treatment, will also refer to infectious disease if she needs monitoring for titers or any further treatment  - Ambulatory referral to Infectious Disease  Will follow up results of pap smear and manage accordingly. Mammogram scheduled Routine preventative health maintenance measures emphasized. Please refer to After Visit Summary for other counseling recommendations.     F/u in 1 year  Lynnda Shields, MD, [place], [facility] for [employer], [place]

## 2022-10-12 NOTE — Progress Notes (Signed)
Pt states she had not had a cycle for about 6 months and had cycle approx 3 weeks ago. Pt states she noticed some red spot that developed on abd, hands, feet. Pt states no itching,  but says she has had an on and off fever.    Pt does not have PCP.   Pt mom had ovarian/cervical cancer. Pt great-grandmother had hx of ovarian/cervical and breast.

## 2022-10-13 LAB — CERVICOVAGINAL ANCILLARY ONLY
Chlamydia: NEGATIVE
Comment: NEGATIVE
Comment: NEGATIVE
Comment: NORMAL
Neisseria Gonorrhea: NEGATIVE
Trichomonas: POSITIVE — AB

## 2022-10-15 LAB — CYTOLOGY - PAP
Comment: NEGATIVE
Diagnosis: NEGATIVE
High risk HPV: NEGATIVE

## 2022-10-18 ENCOUNTER — Telehealth: Payer: Self-pay

## 2022-10-18 ENCOUNTER — Other Ambulatory Visit (HOSPITAL_BASED_OUTPATIENT_CLINIC_OR_DEPARTMENT_OTHER): Payer: Self-pay

## 2022-10-18 ENCOUNTER — Other Ambulatory Visit: Payer: Self-pay

## 2022-10-18 DIAGNOSIS — A599 Trichomoniasis, unspecified: Secondary | ICD-10-CM

## 2022-10-18 MED ORDER — METRONIDAZOLE 500 MG PO TABS
500.0000 mg | ORAL_TABLET | Freq: Two times a day (BID) | ORAL | 0 refills | Status: AC
  Filled 2022-10-18 – 2022-10-20 (×3): qty 14, 7d supply, fill #0

## 2022-10-18 NOTE — Telephone Encounter (Signed)
I connected with  Beth Jacobs on 10/18/22 by telephone and verified that I am speaking with the correct person using two identifiers.   . Pt informed of lab results and voiced understanding. Pt advised to abstain from any unprotected intercourse until everyone has received appropriate treatment. Pt advised to inform partner(s) so partner(s) can be tested and treated as well.

## 2022-10-18 NOTE — Telephone Encounter (Signed)
-----   Message from Griffin Basil, MD sent at 10/15/2022  7:08 PM EST ----- Pap smear negative, trichomonas noted on vaginal swab, offer treatment

## 2022-10-19 ENCOUNTER — Other Ambulatory Visit (HOSPITAL_COMMUNITY): Payer: Self-pay

## 2022-10-20 ENCOUNTER — Ambulatory Visit (INDEPENDENT_AMBULATORY_CARE_PROVIDER_SITE_OTHER): Payer: Commercial Managed Care - HMO | Admitting: Internal Medicine

## 2022-10-20 ENCOUNTER — Other Ambulatory Visit: Payer: Self-pay

## 2022-10-20 ENCOUNTER — Encounter: Payer: Self-pay | Admitting: Internal Medicine

## 2022-10-20 ENCOUNTER — Other Ambulatory Visit (HOSPITAL_BASED_OUTPATIENT_CLINIC_OR_DEPARTMENT_OTHER): Payer: Self-pay

## 2022-10-20 VITALS — BP 113/79 | HR 91 | Temp 98.2°F | Ht 61.0 in | Wt 202.0 lb

## 2022-10-20 DIAGNOSIS — A530 Latent syphilis, unspecified as early or late: Secondary | ICD-10-CM | POA: Diagnosis not present

## 2022-10-20 DIAGNOSIS — R21 Rash and other nonspecific skin eruption: Secondary | ICD-10-CM

## 2022-10-20 MED ORDER — PENICILLIN G BENZATHINE 1200000 UNIT/2ML IM SUSY
1.2000 10*6.[IU] | PREFILLED_SYRINGE | Freq: Once | INTRAMUSCULAR | Status: AC
  Administered 2022-10-20: 1.2 10*6.[IU] via INTRAMUSCULAR

## 2022-10-20 NOTE — Patient Instructions (Signed)
You'll get first shots of penicillin today for syphilis  Please make a nurse visit follow up in 1 week from now and 2 weeks from now for the 2nd and 3rd shot. If you missed one of these appointments the shots series start all over   We will check syphilis titer and hiv screen again today just to be safe   Also make a follow up visit with me in 6 months to repeat blood tests to make sure you are treated well

## 2022-10-20 NOTE — Addendum Note (Signed)
Addended by: Binnie Kand on: 10/20/2022 04:34 PM   Modules accepted: Orders

## 2022-10-20 NOTE — Progress Notes (Signed)
[name] for Infectious Disease  Reason for Consult:syphilis Referring Provider: Lynnda Shields    Patient Active Problem List   Diagnosis Date Noted   BMI 38.0-38.9,adult 10/12/2022   Positive RPR test 10/12/2022   Vitamin D deficiency 02/19/2022   Bipolar 2 disorder ([place]) 06/29/2021   Generalized anxiety disorder 06/29/2021   PTSD (post-traumatic stress disorder) 06/29/2021   Chronic bilateral low back pain with bilateral sciatica 06/29/2021   Cocaine abuse ([place]) 06/29/2021   History of allergic reaction 02/26/2013   Morbid obesity ([place]) 02/26/2013   Facial cellulitis 02/25/2013   Anemia 02/25/2013   AMA (advanced maternal age) multigravida 35+ 10/26/2012   History of preterm delivery, currently pregnant 10/26/2012   Drug abuse ([place]) 10/26/2012   Prenatal care insufficient 10/26/2012      HPI: Beth Jacobs is a 51 y.o. female just graduated from [facility] for cocaine rehab here for rash  She was seen by obgyn for routine annual woman health exam on 10/12/22. At that time patient complained of rash on soles of hands and feet and abdomen of 2-3 weeks prior to this visit. She had a fever at the onset of rash and also sorethroat.  Dr bass noted patient had an rpr titer of 1:1 08/11/22 confirmed with tppa antibody.   Patient never was told she had syphilis in the past. She has never received any penicillin shot or long course of doxycycline that she remembers  She had testing 07/2022 because she had gi sx and vaginal discharge.   She reports vaginal discharge gone by the time she sees dr Elgie Congo. There was a cervical swab that was negative for gc/chlamydia but positive for trichomonas. Dr Elgie Congo has given her a prescription for flagyl that she hasn't picked up yet  She feels well outside of the sorethroat. The palmar and sole rash had kind of dry up; the abdomen rash remains red papule.   She hasn't had sex for at least 8 months.   She doesn't recall being told  that she has any sexually transmitted disease in the past  She smokes cocaine; no hx ivdu or indu. No etoh/tobacco. Just had unprotected sex in the past if any risk factors were to be determined  Her hiv screen was negative in 07/2022  She was seen at [facility] long 11/14 for sorethroat/rash and was told it was maybe hand-foot mouth disease   We checked with DHS and no other syphilis record was available before 08/11/21    Review of Systems: ROS Negative other ros      Past Medical History:  Diagnosis Date   Anxiety    Asthma    Cocaine abuse ([place])    History of allergic reaction 02/26/2013   Mental disorder    Preterm labor    PTSD (post-traumatic stress disorder)    Torn Achilles tendon     Social History   Tobacco Use   Smoking status: Never    Passive exposure: Never   Smokeless tobacco: Never  Vaping Use   Vaping Use: Never used  Substance Use Topics   Alcohol use: No   Drug use: Not Currently    Types: Cocaine    Comment: States clean for 9 months    Family History  Problem Relation Age of Onset   Cancer Mother    Cervical cancer Mother    Cerebral palsy Son        born at [redacted] weeks gestation   Other  Neg Hx     Allergies  Allergen Reactions   Bee Venom Anaphylaxis    "Affects oxygen level"   Latex Hives   Tomato Swelling   Hydrocodone-Acetaminophen Nausea And Vomiting    Patient can take tylenol products and oxycodone , its only hydrocodone that causes issues    OBJECTIVE: Vitals:   10/20/22 1500  BP: 113/79  Pulse: 91  Temp: 98.2 F (36.8 C)  TempSrc: Oral  SpO2: 96%  Weight: 202 lb (91.6 kg)  Height: '5\' 1"'$  (1.549 m)   Body mass index is 38.17 kg/m.   Physical Exam General/constitutional: no distress, pleasant HEENT: Normocephalic, PER, Conj Clear, EOMI, Oropharynx clear Neck supple CV: rrr no mrg Lungs: clear to auscultation, normal respiratory effort Abd: Soft, Nontender Ext: no edema Skin: slightly squamous rash on  palms/soles, and erythematous nontender rash on abdominal area Neuro: nonfocal MSK: no peripheral joint swelling/tenderness/warmth; back spines nontender   Lab:  Microbiology:  Serology:  Imaging:   Assessment/plan: Problem List Items Addressed This Visit   None Visit Diagnoses     Rash    -  Primary   Relevant Orders   RPR   HIV 1 RNA quant-no reflex-bld   Latent syphilis       Relevant Orders   RPR       I will check rpr and hiv rna this visit. She is very adamant that her last sexual exposure was 8 months prior to this visit and no ivdu/indu and she has [facility]  It would be unusual to have a titer of 1:1 and a syphilis rash (although the rash occurs 6 weeks roughly from the first rpr 08/11/22. So it would be helpful to repeat rpr titer  Her hiv screen was negative in 08/11/22 and I suspect she doesn't have it. But will be safe and check hiv rna  She denies being high risk sexual behavior any longer  I suspect if rpr remains 1:1 and hiv negative that the rash might be just nonspecific viral infection   Eitherway will need to treat as late latent syphilis and she is agreeable to 3 weekly benzathine pcn shot   -rpr/hiv rna -benzathine pcn 2.4 mil unit today, in 1week and in 2 weeks nurse visit -rpr again in 6 months     I have spent a total of 65 minutes of face-to-face and non-face-to-face time, excluding clinical staff time, preparing to see patient, ordering tests and/or medications, and provide counseling the patient        Follow-up: No follow-ups on file.  Jabier Mutton, [place] [facility] 10/20/2022, 3:21 PM

## 2022-10-23 LAB — HIV-1 RNA QUANT-NO REFLEX-BLD
HIV 1 RNA Quant: NOT DETECTED Copies/mL
HIV-1 RNA Quant, Log: NOT DETECTED Log cps/mL

## 2022-10-23 LAB — FLUORESCENT TREPONEMAL AB(FTA)-IGG-BLD: Fluorescent Treponemal ABS: REACTIVE — AB

## 2022-10-23 LAB — RPR TITER: RPR Titer: 1:32 {titer} — ABNORMAL HIGH

## 2022-10-23 LAB — RPR: RPR Ser Ql: REACTIVE — AB

## 2022-10-26 NOTE — Progress Notes (Signed)
Cardiology Office Note:    Date:  10/27/2022   ID:  Beth Jacobs, DOB 01-10-1971, MRN [id number]  PCP:  Pcp, No  Cardiologist:  None   Referring MD: Camillia Herter, NP   Chief Complaint  Patient presents with   Chest Pain    History of Present Illness:    Beth Jacobs is a 51 y.o. female with a hx of chest pain.    Has a history of substance use, asthma, PTSD.   Over the past 2 months, she has had 2 instances of chest pain.  She describes the discomfort as a midsternal and left parasternal ache.  The discomfort is not occurred at rest.  The discomfort on each occasion has resolved within 10 minutes.  [facility].  The discomfort is not severe.  She thought about the reproducibility of the discomfort and decided to come for cardiac evaluation because of the recent sudden death of her father.  Former substance abuse who has been clean/abstinent since April.  Since April she has had a significant 60 pound weight gain.  Prior to April her weight was 140 pounds.  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  Past Medical History:  Diagnosis Date   Anxiety    Asthma    Cocaine abuse ([place])    History of allergic reaction 02/26/2013   Mental disorder    Preterm labor    PTSD (post-traumatic stress disorder)    Torn Achilles tendon     Past Surgical History:  Procedure Laterality Date   DILATION AND EVACUATION     NO PAST SURGERIES     WISDOM TOOTH EXTRACTION      Current Medications: Current Meds  Medication Sig   albuterol (PROVENTIL HFA;VENTOLIN HFA) 108 (90 Base) MCG/ACT inhaler Inhale 1-2 puffs into the lungs every 6 (six) hours as needed for wheezing or shortness of breath.   ARIPiprazole (ABILIFY) 10 MG tablet Take 10 mg by mouth daily.   citalopram (CELEXA) 20 MG tablet Take 1 tablet (20 mg total) by mouth at bedtime.   EPINEPHrine 0.3 mg/0.3 mL IJ SOAJ injection  Inject 0.3 mg into the muscle as needed for anaphylaxis.   famotidine (PEPCID) 20 MG tablet Take 1 tablet (20 mg total) by mouth 2 (two) times daily.   ferrous sulfate 325 (65 FE) MG EC tablet Take 325 mg by mouth 3 (three) times daily with meals.   hydrOXYzine (ATARAX) 50 MG tablet Take 1 tablet (50 mg total) by mouth 2 (two) times daily as needed.   lamoTRIgine (LAMICTAL) 25 MG tablet Take 2 tablets (50 mg total) by mouth daily.   metroNIDAZOLE (FLAGYL) 500 MG tablet Take 1 tablet (500 mg total) by mouth 2 (two) times daily for 7 days.   Multiple Vitamin (MULTIVITAMIN ADULT PO) Take 1 tablet by mouth daily.   pantoprazole (PROTONIX) 20 MG tablet Take 1 tablet (20 mg total) by mouth daily.     Allergies:   Bee venom, Latex, Tomato, and Hydrocodone-acetaminophen   Social History   Socioeconomic History   Marital status: Legally Separated    Spouse name: Not on file   Number of children: Not on file   Years of education: Not on file   Highest education level: Not on file  Occupational History   Not on file  Tobacco Use   Smoking status: Never    Passive exposure: Never  Smokeless tobacco: Never  Vaping Use   Vaping Use: Never used  Substance and Sexual Activity   Alcohol use: No   Drug use: Not Currently    Types: Cocaine    Comment: States clean for 9 months   Sexual activity: Yes    Partners: Male    Birth control/protection: Condom  Other Topics Concern   Not on file  Social History Narrative   Not on file   Social Determinants of Health   Financial Resource Strain: Not on file  Food Insecurity: Not on file  Transportation Needs: Not on file  Physical Activity: Not on file  Stress: Not on file  Social Connections: Not on file     Family History: The patient's family history includes Cancer in her mother; Cerebral palsy in her son; Cervical cancer in her mother. There is no history of Other.  ROS:   Please see the history of present illness.    Does not smoke  or drink.  No history of diabetes or high blood pressure.  All other systems reviewed and are negative.  EKGs/Labs/Other Studies Reviewed:    The following studies were reviewed today: No cardiac imaging  EKG:  EKG atrial abnormality with left axis deviation.  Recent Labs: 02/18/2022: TSH 0.911 10/01/2022: BUN 11; Creatinine, Ser 0.74; Hemoglobin 11.1; Platelets 471; Potassium 3.6; Sodium 140  Recent Lipid Panel No results found for: "CHOL", "TRIG", "HDL", "CHOLHDL", "VLDL", "LDLCALC", "LDLDIRECT"  Physical Exam:    VS:  BP 110/72   Pulse 75   Ht '5\' 1"'$  (1.549 m)   Wt 208 lb 9.6 oz (94.6 kg)   SpO2 97%   BMI 39.41 kg/m     Wt Readings from Last 3 Encounters:  10/27/22 208 lb 9.6 oz (94.6 kg)  10/20/22 202 lb (91.6 kg)  10/12/22 203 lb (92.1 kg)     GEN: Morbid obesity. No acute distress HEENT: Normal NECK: No JVD. LYMPHATICS: No lymphadenopathy CARDIAC: No murmur. RRR S4 gallop, or edema. VASCULAR:  Normal Pulses. No bruits. RESPIRATORY:  Clear to auscultation without rales, wheezing or rhonchi  ABDOMEN: Soft, non-tender, non-distended, No pulsatile mass, MUSCULOSKELETAL: No deformity  SKIN: Warm and dry NEUROLOGIC:  Alert and oriented x 3 PSYCHIATRIC:  Normal affect   ASSESSMENT:    1. Chest pain of uncertain etiology   2. Morbid obesity ([place])    PLAN:    In order of problems listed above:  Uncertain etiology.  Exercise treadmill test to monitor blood pressure and EKG response to exercise.  Encouraged her to avoid heavy physical activity until we know how the treadmill test turns out.  If any significant abnormalities, she will need a 2D Doppler echocardiogram and longitudinal cardiology follow-up with one of my younger partners after my retirement. Discussed weight loss.      Medication Adjustments/Labs and Tests Ordered: Current medicines are reviewed at length with the patient today.  Concerns regarding medicines are outlined above.  Orders Placed This  Encounter  Procedures   Cardiac Stress Test: Informed Consent Details: Physician/Practitioner Attestation; Transcribe to consent form and obtain patient signature   EXERCISE TOLERANCE TEST (ETT)   No orders of the defined types were placed in this encounter.   Patient Instructions  Medication Instructions:  Your physician recommends that you continue on your current medications as directed. Please refer to the Current Medication list given to you today.  *If you need a refill on your cardiac medications before your next appointment, please call your pharmacy*  Testing/Procedures: Your physician has requested that you have an exercise tolerance test. For further information please visit [email or web address]. Please also follow instruction sheet, as given.   Follow-Up: Will be determined based on results of exercise stress test.  Other Instructions Instructions for Exercise Treadmill Stress Test  Please arrive 15 minutes prior to your appointment time for registration and insurance purposes.  The test will take approximately 45 minutes to complete.  How to prepare for your Exercise Stress Test: Do bring a list of your current medications with you.  If not listed below, you may take your medications as normal. Do wear comfortable clothes (no dresses or overalls) and walking shoes, tennis shoes preferred (no heels or open toed shoes are allowed) Do Not wear cologne, perfume, aftershave or lotions (deodorant is allowed). Please report to [address], [address] for your test.  If these instructions are not followed, your test will have to be rescheduled.  If you have questions or concerns about your appointment, you can call the Stress Lab at [phone].  If you cannot keep your appointment, please provide 24 hours notification to the Stress Lab, to avoid a possible $50 charge to your account.  Important Information About Sugar         Signed, Sinclair Grooms, MD   10/27/2022 4:46 PM    [facility]

## 2022-10-27 ENCOUNTER — Ambulatory Visit: Payer: Commercial Managed Care - HMO | Attending: Interventional Cardiology | Admitting: Interventional Cardiology

## 2022-10-27 ENCOUNTER — Encounter: Payer: Self-pay | Admitting: Interventional Cardiology

## 2022-10-27 ENCOUNTER — Encounter: Payer: BC Managed Care – PPO | Admitting: Family Medicine

## 2022-10-27 ENCOUNTER — Ambulatory Visit (INDEPENDENT_AMBULATORY_CARE_PROVIDER_SITE_OTHER): Payer: Commercial Managed Care - HMO

## 2022-10-27 ENCOUNTER — Other Ambulatory Visit: Payer: Self-pay

## 2022-10-27 VITALS — BP 110/72 | HR 75 | Ht 61.0 in | Wt 208.6 lb

## 2022-10-27 DIAGNOSIS — A530 Latent syphilis, unspecified as early or late: Secondary | ICD-10-CM | POA: Diagnosis not present

## 2022-10-27 DIAGNOSIS — R079 Chest pain, unspecified: Secondary | ICD-10-CM

## 2022-10-27 MED ORDER — PENICILLIN G BENZATHINE 1200000 UNIT/2ML IM SUSY
1.2000 10*6.[IU] | PREFILLED_SYRINGE | Freq: Once | INTRAMUSCULAR | Status: AC
  Administered 2022-10-27: 1.2 10*6.[IU] via INTRAMUSCULAR

## 2022-10-27 NOTE — Patient Instructions (Signed)
Medication Instructions:  Your physician recommends that you continue on your current medications as directed. Please refer to the Current Medication list given to you today.  *If you need a refill on your cardiac medications before your next appointment, please call your pharmacy*  Testing/Procedures: Your physician has requested that you have an exercise tolerance test. For further information please visit [email or web address]. Please also follow instruction sheet, as given.   Follow-Up: Will be determined based on results of exercise stress test.  Other Instructions Instructions for Exercise Treadmill Stress Test  Please arrive 15 minutes prior to your appointment time for registration and insurance purposes.  The test will take approximately 45 minutes to complete.  How to prepare for your Exercise Stress Test: Do bring a list of your current medications with you.  If not listed below, you may take your medications as normal. Do wear comfortable clothes (no dresses or overalls) and walking shoes, tennis shoes preferred (no heels or open toed shoes are allowed) Do Not wear cologne, perfume, aftershave or lotions (deodorant is allowed). Please report to [address] for your test.  If these instructions are not followed, your test will have to be rescheduled.  If you have questions or concerns about your appointment, you can call the Stress Lab at [phone].  If you cannot keep your appointment, please provide 24 hours notification to the Stress Lab, to avoid a possible $50 charge to your account.  Important Information About Sugar

## 2022-11-03 ENCOUNTER — Ambulatory Visit: Payer: Commercial Managed Care - HMO

## 2022-11-03 ENCOUNTER — Other Ambulatory Visit: Payer: Self-pay

## 2022-11-03 ENCOUNTER — Ambulatory Visit (INDEPENDENT_AMBULATORY_CARE_PROVIDER_SITE_OTHER): Payer: Commercial Managed Care - HMO

## 2022-11-03 DIAGNOSIS — A530 Latent syphilis, unspecified as early or late: Secondary | ICD-10-CM

## 2022-11-03 MED ORDER — PENICILLIN G BENZATHINE 1200000 UNIT/2ML IM SUSY
1.2000 10*6.[IU] | PREFILLED_SYRINGE | Freq: Once | INTRAMUSCULAR | Status: AC
  Administered 2022-11-03: 1.2 10*6.[IU] via INTRAMUSCULAR

## 2022-11-10 ENCOUNTER — Ambulatory Visit: Payer: Commercial Managed Care - HMO | Attending: Interventional Cardiology

## 2022-11-10 DIAGNOSIS — R079 Chest pain, unspecified: Secondary | ICD-10-CM

## 2022-11-10 LAB — EXERCISE TOLERANCE TEST
Angina Index: 0
Duke Treadmill Score: 9
Estimated workload: 10.1
Exercise duration (min): 9 min
Exercise duration (sec): 0 s
MPHR: 170 {beats}/min
Peak HR: 157 {beats}/min
Percent HR: 92 %
RPE: 16
Rest HR: 157 {beats}/min
ST Depression (mm): 0 mm

## 2022-12-10 ENCOUNTER — Ambulatory Visit: Payer: Commercial Managed Care - HMO

## 2022-12-15 ENCOUNTER — Emergency Department (HOSPITAL_COMMUNITY)
Admission: EM | Admit: 2022-12-15 | Discharge: 2022-12-15 | Discharge status: Against Medical Advice | Payer: Commercial Managed Care - HMO | Attending: Emergency Medicine | Admitting: Emergency Medicine

## 2022-12-15 DIAGNOSIS — Z5329 Procedure and treatment not carried out because of patient's decision for other reasons: Secondary | ICD-10-CM | POA: Diagnosis not present

## 2022-12-15 DIAGNOSIS — F1910 Other psychoactive substance abuse, uncomplicated: Secondary | ICD-10-CM | POA: Insufficient documentation

## 2022-12-15 NOTE — ED Provider Notes (Signed)
  [facility] EMERGENC[facility] DEPARTMENT AT [facility] Provider Note   CSN: [id number] Arrival date & time: 12/15/22  3149     History Chief Complaint  Patient presents with   Addiction Problem    HPI Beth Jacobs is a 52 y.o. female presenting for chief complaint of intoxication.  States that she relapsed on cocaine 2 days ago and wants to be admitted for detox.  She denies fevers or chills, nausea vomiting, syncope shortness of breath.  Otherwise ambulatory tolerating p.o. intake.   Patient's recorded medical, surgical, social, medication list and allergies were reviewed in the Snapshot window as part of the initial history.   Review of Systems   Review of Systems  Unable to perform ROS: Psychiatric disorder    Physical Exam Updated Vital Signs BP (!) 117/96 (BP Location: Left Arm)   Pulse 82   Temp 98.7 F (37.1 C) (Oral)   Resp 18   SpO2 100%  Physical Exam Constitutional:      General: She is not in acute distress.    Appearance: Normal appearance. She is normal weight.  Neurological:     Mental Status: She is alert.      ED Course/ Medical Decision Making/ A&P    Procedures Procedures   Medications Ordered in ED Medications - No data to display  Medical Decision Making:    Beth C Kawa is a 52 y.o. female who presented to the ED today with chief complaint of requesting detox, detailed above.     Patient's presentation is complicated by their history of polysubstance abuse.  Patient placed on continuous vitals and telemetry monitoring while in ED which was reviewed periodically.   Complete initial physical exam performed, notably the patient  was hemodynamically stable in no acute distress.      Reviewed and confirmed nursing documentation for past medical history, family history, social history.    Initial Assessment:   This is a challenging situation.  Patient is requesting admission for detox from cocaine. Hemodynamically stable no acute  distress.  Informed patient that cocaine detox is not something that we have access to here in the emergency department but that I will provide patient with a list of resources for local psychiatric and substance abuse facilities and recommended that she follow-up with them for further care and management.  Disposition:  I have considered need for hospitalization, however, considering all of the above, I believe this patient is stable for discharge at this time.  Patient/family educated about specific return precautions for given chief complaint and symptoms.  Patient/family educated about follow-up with PCP and local psychiatric resources.     Patient/family expressed understanding of return precautions and need for follow-up. Patient spoken to regarding all imaging and laboratory results and appropriate follow up for these results. All education provided in verbal form with additional information in written form. Time was allowed for answering of patient questions. Patient discharged.    Emergency Department Medication Summary:   Medications - No data to display       Clinical Impression:  1. Polysubstance abuse [facility])      Discharge   Final Clinical Impression(s) / ED Diagnoses Final diagnoses:  Polysubstance abuse [facility])    Rx / DC Orders ED Discharge Orders     None         Tretha Sciara, MD 12/15/22 [phone]

## 2022-12-15 NOTE — ED Triage Notes (Signed)
Pt states that she relapsed on crack cocaine a day and a half ago and needs detox to be able to re-enter the [facility]. Pt denies other illicit drug use. Pt movements are very erratic in triage. Denies SI/HI. No AVH.

## 2022-12-15 NOTE — ED Notes (Signed)
Patient unhappy with what she has been told by her provider. I went in to speak with the patient and it was reported to me that she was recording the EDP without his permission. I asked the patient about if she was recording and she stated no that she was on a video call with her family member so they could hear what the EDP said.   She wanted a patient experience card which she has been given. She asked about resources for detox and I let her know they will be on her discharge paperwork. I asked her to please not record staff without their consent as this is against our policy. She stated she stated she would not. Security outside room in case they were needed.   Patient then stormed out of her room with all her belongings yelling that she did record staff and she is not deleting it. She left without her discharge paperwork as well accompanied by a friend.

## 2023-04-26 ENCOUNTER — Ambulatory Visit: Payer: Commercial Managed Care - HMO | Admitting: Internal Medicine
